# Patient Record
Sex: Male | Born: 1955 | Race: White | Hispanic: No | Marital: Married | Smoking: Former smoker
Health system: Southern US, Community
[De-identification: ages and names within clinical notes are randomized; demographics above are authoritative.]

## PROBLEM LIST (undated history)

## (undated) DIAGNOSIS — R112 Nausea with vomiting, unspecified: Secondary | ICD-10-CM

## (undated) DIAGNOSIS — G4733 Obstructive sleep apnea (adult) (pediatric): Secondary | ICD-10-CM

## (undated) DIAGNOSIS — K269 Duodenal ulcer, unspecified as acute or chronic, without hemorrhage or perforation: Secondary | ICD-10-CM

## (undated) DIAGNOSIS — Z9889 Other specified postprocedural states: Secondary | ICD-10-CM

## (undated) DIAGNOSIS — G2581 Restless legs syndrome: Secondary | ICD-10-CM

## (undated) DIAGNOSIS — H269 Unspecified cataract: Secondary | ICD-10-CM

## (undated) DIAGNOSIS — Z9989 Dependence on other enabling machines and devices: Secondary | ICD-10-CM

## (undated) DIAGNOSIS — E7800 Pure hypercholesterolemia, unspecified: Secondary | ICD-10-CM

## (undated) DIAGNOSIS — T884XXA Failed or difficult intubation, initial encounter: Secondary | ICD-10-CM

## (undated) DIAGNOSIS — I10 Essential (primary) hypertension: Secondary | ICD-10-CM

## (undated) HISTORY — PX: OTHER SURGICAL HISTORY: SHX169

## (undated) HISTORY — PX: ESOPHAGOGASTRODUODENOSCOPY: SHX1529

## (undated) HISTORY — PX: COLON SURGERY: SHX602

## (undated) HISTORY — PX: HAND SURGERY: SHX662

## (undated) HISTORY — PX: COLONOSCOPY: SHX174

---
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
## 1993-02-03 HISTORY — PX: FRACTURE SURGERY: SHX138

## 1996-02-04 HISTORY — PX: NASAL SEPTUM SURGERY: SHX37

## 1998-02-03 HISTORY — PX: REFRACTIVE SURGERY: SHX103

## 1999-02-04 HISTORY — PX: HERNIA REPAIR: SHX51

## 2002-02-03 HISTORY — PX: ROTATOR CUFF REPAIR: SHX139

## 2002-06-10 ENCOUNTER — Ambulatory Visit (HOSPITAL_BASED_OUTPATIENT_CLINIC_OR_DEPARTMENT_OTHER): Admission: RE | Admit: 2002-06-10 | Discharge: 2002-06-10 | Payer: Self-pay | Admitting: Orthopedic Surgery

## 2006-02-03 HISTORY — PX: COLECTOMY: SHX59

## 2006-02-03 HISTORY — PX: APPENDECTOMY: SHX54

## 2006-02-14 ENCOUNTER — Inpatient Hospital Stay (HOSPITAL_COMMUNITY): Admission: EM | Admit: 2006-02-14 | Discharge: 2006-02-21 | Payer: Self-pay | Admitting: Emergency Medicine

## 2006-03-30 ENCOUNTER — Encounter: Admission: RE | Admit: 2006-03-30 | Discharge: 2006-03-30 | Payer: Self-pay | Admitting: Surgery

## 2010-11-25 ENCOUNTER — Ambulatory Visit: Payer: PRIVATE HEALTH INSURANCE | Attending: Family Medicine

## 2010-11-25 DIAGNOSIS — IMO0001 Reserved for inherently not codable concepts without codable children: Secondary | ICD-10-CM | POA: Insufficient documentation

## 2010-11-25 DIAGNOSIS — M545 Low back pain, unspecified: Secondary | ICD-10-CM | POA: Insufficient documentation

## 2010-11-25 DIAGNOSIS — M546 Pain in thoracic spine: Secondary | ICD-10-CM | POA: Insufficient documentation

## 2010-11-25 DIAGNOSIS — R5381 Other malaise: Secondary | ICD-10-CM | POA: Insufficient documentation

## 2010-11-29 ENCOUNTER — Ambulatory Visit: Payer: PRIVATE HEALTH INSURANCE | Admitting: Physical Therapy

## 2010-12-03 ENCOUNTER — Ambulatory Visit: Payer: PRIVATE HEALTH INSURANCE | Admitting: Physical Therapy

## 2010-12-06 ENCOUNTER — Ambulatory Visit: Payer: PRIVATE HEALTH INSURANCE | Attending: Family Medicine | Admitting: Physical Therapy

## 2010-12-06 DIAGNOSIS — M545 Low back pain, unspecified: Secondary | ICD-10-CM | POA: Insufficient documentation

## 2010-12-06 DIAGNOSIS — M546 Pain in thoracic spine: Secondary | ICD-10-CM | POA: Insufficient documentation

## 2010-12-06 DIAGNOSIS — IMO0001 Reserved for inherently not codable concepts without codable children: Secondary | ICD-10-CM | POA: Insufficient documentation

## 2010-12-06 DIAGNOSIS — R5381 Other malaise: Secondary | ICD-10-CM | POA: Insufficient documentation

## 2010-12-13 ENCOUNTER — Ambulatory Visit: Payer: PRIVATE HEALTH INSURANCE | Admitting: Physical Therapy

## 2010-12-20 ENCOUNTER — Ambulatory Visit: Payer: PRIVATE HEALTH INSURANCE

## 2012-03-16 ENCOUNTER — Encounter (HOSPITAL_BASED_OUTPATIENT_CLINIC_OR_DEPARTMENT_OTHER): Payer: 59

## 2012-03-23 ENCOUNTER — Ambulatory Visit (HOSPITAL_BASED_OUTPATIENT_CLINIC_OR_DEPARTMENT_OTHER): Payer: 59 | Attending: Family Medicine | Admitting: Radiology

## 2012-03-23 VITALS — Ht 75.0 in | Wt 270.0 lb

## 2012-03-23 DIAGNOSIS — G4733 Obstructive sleep apnea (adult) (pediatric): Secondary | ICD-10-CM | POA: Insufficient documentation

## 2012-03-23 DIAGNOSIS — Z9989 Dependence on other enabling machines and devices: Secondary | ICD-10-CM

## 2012-03-27 DIAGNOSIS — G4733 Obstructive sleep apnea (adult) (pediatric): Secondary | ICD-10-CM

## 2012-03-27 NOTE — Procedures (Signed)
NAMEPHILIPPE, Ian Velasquez                 ACCOUNT NO.:  [id number]  MEDICAL RECORD NO.:  [id number]          PATIENT TYPE:  OUT  LOCATION:  SLEEP CENTER                 FACILITY:  [facility]  PHYSICIAN:  Hayzel Ruberg D. Maple Hudson, MD, FCCP, FACPDATE OF BIRTH:  Jun 10[birth year] 1955  DATE OF STUDY:  03/23/2012                           NOCTURNAL POLYSOMNOGRAM  REFERRING PHYSICIAN:  Dibas Koirala, MD  INDICATION FOR STUDY:  Insomnia with sleep apnea.  EPWORTH SLEEPINESS SCORE:  13/24.  BMI 33.7, weight 270 pounds, height 75 inches, neck 18 inches.  MEDICATIONS:  Home medications are charted and reviewed.  SLEEP ARCHITECTURE:  Split study protocol.  During the diagnostic phase, total sleep time 155 minutes with sleep efficiency 82.2%.  Stage I was 7.4%, stage II 74.8%, stage III absent, REM 17.7% of total sleep time. Sleep latency 24.5 minutes, REM latency 85.5 minutes, awake after sleep onset 7 minutes.  Arousal index of 17.  BEDTIME MEDICATION:  Simvastatin, clonazepam, meloxicam.  RESPIRATORY DATA:  Split study protocol.  Apnea-hypopnea index (AHI) 19 per hour.  A total of 49 events was scored, all as hypopneas seen in all sleep positions, especially while supine.  REM AHI 69.8 per hour.  CPAP was then titrated to 15 CWP, AHI 0 per hour.  He wore a large ResMed Mirage Quattro full-face mask with heated humidifier.  OXYGEN DATA:  Moderately loud snoring before CPAP with oxygen desaturation to a nadir of 81% on room air.  With CPAP titration, snoring was prevented and mean oxygen saturation held 94.8% on room air.  CARDIAC DATA:  Sinus rhythm with occasional PVC.  MOVEMENT-PARASOMNIA:  No significant movement disturbance.  No bathroom trips.  IMPRESSIONS-RECOMMENDATIONS: 1. Moderate obstructive sleep apnea/hypopnea syndrome, AHI 19 per hour     with events in all sleep positions, especially while supine.     Moderately loud snoring with oxygen desaturation to a nadir of 81%     and mean oxygen saturation  through the study of 94% on room air. 2. Successful CPAP titration to 15 CWP, AHI 0 per hour.  He wore a     large ResMed Mirage Quattro full-face     mask with heated humidifier and EPR of 2.  Snoring was prevented     and mean oxygen saturation held 94.8% on room air with CPAP.     Orvin Netter D. Maple Hudson, MD, [employer] [facility], FACP Diplomate, American Board of Sleep Medicine    CDY/MEDQ  D:  03/27/2012 15:49:00  T:  03/27/2012 20:49:17  Job:  [id number]

## 2012-11-22 ENCOUNTER — Encounter (INDEPENDENT_AMBULATORY_CARE_PROVIDER_SITE_OTHER): Payer: Commercial Managed Care - PPO | Admitting: Ophthalmology

## 2012-11-22 DIAGNOSIS — H4310 Vitreous hemorrhage, unspecified eye: Secondary | ICD-10-CM

## 2012-11-22 DIAGNOSIS — H2510 Age-related nuclear cataract, unspecified eye: Secondary | ICD-10-CM

## 2012-11-22 DIAGNOSIS — H35039 Hypertensive retinopathy, unspecified eye: Secondary | ICD-10-CM

## 2012-11-22 DIAGNOSIS — H43819 Vitreous degeneration, unspecified eye: Secondary | ICD-10-CM

## 2012-11-22 DIAGNOSIS — I10 Essential (primary) hypertension: Secondary | ICD-10-CM

## 2012-11-23 ENCOUNTER — Ambulatory Visit
Admission: RE | Admit: 2012-11-23 | Discharge: 2012-11-23 | Disposition: A | Discharge status: Home / Self Care | Payer: 59 | Source: Ambulatory Visit | Attending: Family Medicine | Admitting: Family Medicine

## 2012-11-23 ENCOUNTER — Other Ambulatory Visit: Payer: Self-pay | Admitting: Family Medicine

## 2012-11-23 DIAGNOSIS — Z01818 Encounter for other preprocedural examination: Secondary | ICD-10-CM

## 2012-11-23 NOTE — H&P (Signed)
Ian Velasquez is an 57 y.o. male.   Chief Complaint: Sudden loss of vision right eye HPI: Lifted an 80 pound item, saw strings and lost vision right eye  No past medical history on file.  No past surgical history on file.  No family history on file. Social History:  has no tobacco, alcohol, and drug history on file.  Allergies: Allergies not on file  No prescriptions prior to admission    Review of systems otherwise negative  There were no vitals taken for this visit.  Physical exam: Mental status: oriented x3. Eyes: See eye exam associated with this date of surgery in media tab.  Scanned in by scanning center Ears, Nose, Throat: within normal limits Neck: Within Normal limits General: within normal limits Chest: Within normal limits Breast: deferred Heart: Within normal limits Abdomen: Within normal limits GU: deferred Extremities: within normal limits Skin: within normal limits  Assessment/Plan Vitreous hemorrhage right eye Plan: To [facility] for Pars plana vitrectomy, laser, gas injection right eye.  Sherrie George 11/23/2012, 7:28 AM

## 2012-11-26 ENCOUNTER — Encounter (HOSPITAL_COMMUNITY): Payer: Self-pay | Admitting: Pharmacy Technician

## 2012-11-29 ENCOUNTER — Other Ambulatory Visit (HOSPITAL_COMMUNITY): Payer: Self-pay | Admitting: *Deleted

## 2012-11-29 ENCOUNTER — Encounter (HOSPITAL_COMMUNITY): Payer: Self-pay | Admitting: *Deleted

## 2012-11-29 ENCOUNTER — Encounter (INDEPENDENT_AMBULATORY_CARE_PROVIDER_SITE_OTHER): Payer: Commercial Managed Care - PPO | Admitting: Ophthalmology

## 2012-11-29 DIAGNOSIS — H43819 Vitreous degeneration, unspecified eye: Secondary | ICD-10-CM

## 2012-11-29 DIAGNOSIS — H4310 Vitreous hemorrhage, unspecified eye: Secondary | ICD-10-CM

## 2012-11-29 DIAGNOSIS — H2510 Age-related nuclear cataract, unspecified eye: Secondary | ICD-10-CM

## 2012-11-29 DIAGNOSIS — H35039 Hypertensive retinopathy, unspecified eye: Secondary | ICD-10-CM

## 2012-11-29 DIAGNOSIS — I10 Essential (primary) hypertension: Secondary | ICD-10-CM

## 2012-11-29 MED ORDER — CYCLOPENTOLATE HCL 1 % OP SOLN: 1.0000 [drp] | OPHTHALMIC | Status: DC | PRN

## 2012-11-29 MED ORDER — PHENYLEPHRINE HCL 2.5 % OP SOLN: 1.0000 [drp] | OPHTHALMIC | Status: DC | PRN

## 2012-11-29 MED ORDER — TROPICAMIDE 1 % OP SOLN: 1.0000 [drp] | OPHTHALMIC | Status: DC | PRN

## 2012-11-29 MED ORDER — DEXTROSE 5 % IV SOLN
3.0000 g | INTRAVENOUS | Status: AC
  Administered 2012-11-30: 3 g via INTRAVENOUS
  Filled 2012-11-29: qty 3000

## 2012-11-29 MED ORDER — GATIFLOXACIN 0.5 % OP SOLN: 1.0000 [drp] | OPHTHALMIC | Status: DC | PRN

## 2012-11-30 ENCOUNTER — Observation Stay (HOSPITAL_COMMUNITY)
Admission: RE | Admit: 2012-11-30 | Discharge: 2012-12-01 | Disposition: A | Discharge status: Home / Self Care | Payer: 59 | Source: Ambulatory Visit | Attending: Ophthalmology | Admitting: Ophthalmology

## 2012-11-30 ENCOUNTER — Encounter (HOSPITAL_COMMUNITY)
Admission: RE | Disposition: A | Discharge status: Home / Self Care | Payer: Self-pay | Source: Ambulatory Visit | Attending: Ophthalmology

## 2012-11-30 ENCOUNTER — Encounter (HOSPITAL_COMMUNITY): Payer: 59 | Admitting: Certified Registered Nurse Anesthetist

## 2012-11-30 ENCOUNTER — Encounter (HOSPITAL_COMMUNITY): Payer: Self-pay | Admitting: *Deleted

## 2012-11-30 ENCOUNTER — Ambulatory Visit (HOSPITAL_COMMUNITY): Payer: 59 | Admitting: Certified Registered Nurse Anesthetist

## 2012-11-30 DIAGNOSIS — H33301 Unspecified retinal break, right eye: Secondary | ICD-10-CM | POA: Diagnosis present

## 2012-11-30 DIAGNOSIS — H33009 Unspecified retinal detachment with retinal break, unspecified eye: Principal | ICD-10-CM | POA: Insufficient documentation

## 2012-11-30 DIAGNOSIS — H4311 Vitreous hemorrhage, right eye: Secondary | ICD-10-CM

## 2012-11-30 DIAGNOSIS — I10 Essential (primary) hypertension: Secondary | ICD-10-CM | POA: Insufficient documentation

## 2012-11-30 DIAGNOSIS — H4310 Vitreous hemorrhage, unspecified eye: Secondary | ICD-10-CM

## 2012-11-30 DIAGNOSIS — G4730 Sleep apnea, unspecified: Secondary | ICD-10-CM | POA: Insufficient documentation

## 2012-11-30 HISTORY — DX: Pure hypercholesterolemia, unspecified: E78.00

## 2012-11-30 HISTORY — DX: Duodenal ulcer, unspecified as acute or chronic, without hemorrhage or perforation: K26.9

## 2012-11-30 HISTORY — DX: Essential (primary) hypertension: I10

## 2012-11-30 HISTORY — PX: PARS PLANA VITRECTOMY: SHX2166

## 2012-11-30 HISTORY — DX: Dependence on other enabling machines and devices: Z99.89

## 2012-11-30 HISTORY — PX: GAS/FLUID EXCHANGE: SHX5334

## 2012-11-30 HISTORY — DX: Failed or difficult intubation, initial encounter: T88.4XXA

## 2012-11-30 HISTORY — PX: PHOTOCOAGULATION WITH LASER: SHX6027

## 2012-11-30 HISTORY — DX: Obstructive sleep apnea (adult) (pediatric): G47.33

## 2012-11-30 LAB — BASIC METABOLIC PANEL
BUN: 18 mg/dL (ref 6–23)
CO2: 28 mEq/L (ref 19–32)
Chloride: 102 mEq/L (ref 96–112)
Creatinine, Ser: 0.93 mg/dL (ref 0.50–1.35)
Glucose, Bld: 113 mg/dL — ABNORMAL HIGH (ref 70–99)
Potassium: 4.3 mEq/L (ref 3.5–5.1)

## 2012-11-30 LAB — CBC
HCT: 42.5 % (ref 39.0–52.0)
Hemoglobin: 14.9 g/dL (ref 13.0–17.0)
MCH: 29.4 pg (ref 26.0–34.0)
MCHC: 35.1 g/dL (ref 30.0–36.0)
MCV: 83.8 fL (ref 78.0–100.0)

## 2012-11-30 SURGERY — PARS PLANA VITRECTOMY WITH 25 GAUGE
Anesthesia: General | Site: Eye | Laterality: Right | Wound class: Clean

## 2012-11-30 MED ORDER — ACETAZOLAMIDE SODIUM 500 MG IJ SOLR
500.0000 mg | Freq: Once | INTRAMUSCULAR | Status: AC
  Administered 2012-12-01: 500 mg via INTRAVENOUS
  Filled 2012-11-30: qty 500

## 2012-11-30 MED ORDER — ONDANSETRON HCL 4 MG/2ML IJ SOLN
INTRAMUSCULAR | Status: DC | PRN
  Administered 2012-11-30: 4 mg via INTRAVENOUS

## 2012-11-30 MED ORDER — EPINEPHRINE HCL 1 MG/ML IJ SOLN
INTRAOCULAR | Status: DC | PRN
  Administered 2012-11-30: 14:00:00

## 2012-11-30 MED ORDER — GATIFLOXACIN 0.5 % OP SOLN
1.0000 [drp] | OPHTHALMIC | Status: AC | PRN
  Administered 2012-11-30 (×3): 1 [drp] via OPHTHALMIC
  Filled 2012-11-30: qty 2.5

## 2012-11-30 MED ORDER — POLYMYXIN B SULFATE 500000 UNITS IJ SOLR
INTRAMUSCULAR | Status: AC
  Filled 2012-11-30: qty 1

## 2012-11-30 MED ORDER — ACETAZOLAMIDE SODIUM 500 MG IJ SOLR
INTRAMUSCULAR | Status: AC
  Filled 2012-11-30: qty 500

## 2012-11-30 MED ORDER — ACETAMINOPHEN 325 MG PO TABS
325.0000 mg | ORAL_TABLET | ORAL | Status: DC | PRN
  Administered 2012-11-30: 650 mg via ORAL
  Filled 2012-11-30: qty 2

## 2012-11-30 MED ORDER — HYDROMORPHONE HCL PF 1 MG/ML IJ SOLN
0.2500 mg | INTRAMUSCULAR | Status: DC | PRN
  Administered 2012-11-30: 0.5 mg via INTRAVENOUS

## 2012-11-30 MED ORDER — BACITRACIN-POLYMYXIN B 500-10000 UNIT/GM OP OINT
TOPICAL_OINTMENT | OPHTHALMIC | Status: DC | PRN
  Administered 2012-11-30: 1 via OPHTHALMIC

## 2012-11-30 MED ORDER — HYDROMORPHONE HCL PF 1 MG/ML IJ SOLN
INTRAMUSCULAR | Status: AC
  Filled 2012-11-30: qty 1

## 2012-11-30 MED ORDER — GENTAMICIN SULFATE 40 MG/ML IJ SOLN
INTRAMUSCULAR | Status: AC
  Filled 2012-11-30: qty 2

## 2012-11-30 MED ORDER — PREDNISOLONE ACETATE 1 % OP SUSP
1.0000 [drp] | Freq: Four times a day (QID) | OPHTHALMIC | Status: DC
  Filled 2012-11-30: qty 1

## 2012-11-30 MED ORDER — FENTANYL CITRATE 0.05 MG/ML IJ SOLN: 25.0000 ug | INTRAMUSCULAR | Status: DC | PRN

## 2012-11-30 MED ORDER — EPINEPHRINE HCL 1 MG/ML IJ SOLN
INTRAMUSCULAR | Status: AC
  Filled 2012-11-30: qty 1

## 2012-11-30 MED ORDER — BACITRACIN-POLYMYXIN B 500-10000 UNIT/GM OP OINT
1.0000 "application " | TOPICAL_OINTMENT | Freq: Four times a day (QID) | OPHTHALMIC | Status: DC
  Filled 2012-11-30: qty 3.5

## 2012-11-30 MED ORDER — DEXAMETHASONE SODIUM PHOSPHATE 10 MG/ML IJ SOLN
INTRAMUSCULAR | Status: DC | PRN
  Administered 2012-11-30: 10 mg

## 2012-11-30 MED ORDER — HYPROMELLOSE (GONIOSCOPIC) 2.5 % OP SOLN
OPHTHALMIC | Status: AC
  Filled 2012-11-30: qty 15

## 2012-11-30 MED ORDER — SODIUM CHLORIDE 0.9 % IV SOLN
INTRAVENOUS | Status: DC
  Administered 2012-11-30 (×2): via INTRAVENOUS

## 2012-11-30 MED ORDER — NEOSTIGMINE METHYLSULFATE 1 MG/ML IJ SOLN
INTRAMUSCULAR | Status: DC | PRN
  Administered 2012-11-30: 5 mg via INTRAVENOUS

## 2012-11-30 MED ORDER — TRIAMCINOLONE ACETONIDE 40 MG/ML IJ SUSP
INTRAMUSCULAR | Status: AC
  Filled 2012-11-30: qty 5

## 2012-11-30 MED ORDER — MIDAZOLAM HCL 5 MG/5ML IJ SOLN
INTRAMUSCULAR | Status: DC | PRN
  Administered 2012-11-30: 2 mg via INTRAVENOUS

## 2012-11-30 MED ORDER — SODIUM CHLORIDE 0.9 % IJ SOLN
INTRAMUSCULAR | Status: DC | PRN
  Administered 2012-11-30: 14:00:00

## 2012-11-30 MED ORDER — CYCLOPENTOLATE HCL 1 % OP SOLN
1.0000 [drp] | OPHTHALMIC | Status: AC | PRN
  Administered 2012-11-30 (×3): 1 [drp] via OPHTHALMIC
  Filled 2012-11-30: qty 2

## 2012-11-30 MED ORDER — TETRACAINE HCL 0.5 % OP SOLN
2.0000 [drp] | Freq: Once | OPHTHALMIC | Status: DC
  Filled 2012-11-30: qty 2

## 2012-11-30 MED ORDER — SODIUM CHLORIDE 0.9 % IJ SOLN
INTRAMUSCULAR | Status: AC
  Filled 2012-11-30: qty 10

## 2012-11-30 MED ORDER — BRIMONIDINE TARTRATE 0.2 % OP SOLN
1.0000 [drp] | Freq: Two times a day (BID) | OPHTHALMIC | Status: DC
  Filled 2012-11-30: qty 5

## 2012-11-30 MED ORDER — DOCUSATE SODIUM 100 MG PO CAPS
100.0000 mg | ORAL_CAPSULE | Freq: Two times a day (BID) | ORAL | Status: DC
  Administered 2012-11-30: 100 mg via ORAL
  Filled 2012-11-30: qty 1

## 2012-11-30 MED ORDER — GLYCOPYRROLATE 0.2 MG/ML IJ SOLN
INTRAMUSCULAR | Status: DC | PRN
  Administered 2012-11-30: .8 mg via INTRAVENOUS

## 2012-11-30 MED ORDER — BSS IO SOLN
INTRAOCULAR | Status: AC
  Filled 2012-11-30: qty 15

## 2012-11-30 MED ORDER — MINERAL OIL LIGHT 100 % EX OIL
TOPICAL_OIL | CUTANEOUS | Status: AC
  Filled 2012-11-30: qty 25

## 2012-11-30 MED ORDER — SODIUM CHLORIDE 0.45 % IV SOLN
INTRAVENOUS | Status: DC
  Administered 2012-11-30: 20 mL/h via INTRAVENOUS

## 2012-11-30 MED ORDER — HYALURONIDASE HUMAN 150 UNIT/ML IJ SOLN
INTRAMUSCULAR | Status: AC
Start: 2012-11-30 — End: 2012-11-30
  Filled 2012-11-30: qty 1

## 2012-11-30 MED ORDER — PROPOFOL 10 MG/ML IV BOLUS
INTRAVENOUS | Status: DC | PRN
  Administered 2012-11-30: 200 mg via INTRAVENOUS

## 2012-11-30 MED ORDER — TROPICAMIDE 1 % OP SOLN
1.0000 [drp] | OPHTHALMIC | Status: AC | PRN
  Administered 2012-11-30 (×3): 1 [drp] via OPHTHALMIC
  Filled 2012-11-30: qty 3

## 2012-11-30 MED ORDER — HYDROCHLOROTHIAZIDE 25 MG PO TABS
25.0000 mg | ORAL_TABLET | Freq: Every morning | ORAL | Status: DC
  Filled 2012-11-30: qty 1

## 2012-11-30 MED ORDER — HYDROCODONE-ACETAMINOPHEN 5-325 MG PO TABS: 1.0000 | ORAL_TABLET | ORAL | Status: DC | PRN

## 2012-11-30 MED ORDER — ROCURONIUM BROMIDE 100 MG/10ML IV SOLN
INTRAVENOUS | Status: DC | PRN
  Administered 2012-11-30: 40 mg via INTRAVENOUS

## 2012-11-30 MED ORDER — BACITRACIN-POLYMYXIN B 500-10000 UNIT/GM OP OINT
TOPICAL_OINTMENT | OPHTHALMIC | Status: AC
  Filled 2012-11-30: qty 3.5

## 2012-11-30 MED ORDER — LACTATED RINGERS IV SOLN: INTRAVENOUS | Status: DC

## 2012-11-30 MED ORDER — PHENYLEPHRINE HCL 2.5 % OP SOLN
1.0000 [drp] | OPHTHALMIC | Status: AC | PRN
  Administered 2012-11-30 (×3): 1 [drp] via OPHTHALMIC
  Filled 2012-11-30: qty 2

## 2012-11-30 MED ORDER — BUPIVACAINE-EPINEPHRINE PF 0.25-1:200000 % IJ SOLN
INTRAMUSCULAR | Status: AC
  Filled 2012-11-30: qty 30

## 2012-11-30 MED ORDER — LIDOCAINE HCL (CARDIAC) 20 MG/ML IV SOLN
INTRAVENOUS | Status: DC | PRN
  Administered 2012-11-30: 40 mg via INTRAVENOUS

## 2012-11-30 MED ORDER — MAGNESIUM HYDROXIDE 400 MG/5ML PO SUSP: 15.0000 mL | Freq: Four times a day (QID) | ORAL | Status: DC | PRN

## 2012-11-30 MED ORDER — SODIUM HYALURONATE 10 MG/ML IO SOLN
INTRAOCULAR | Status: DC | PRN
  Administered 2012-11-30: 0.85 mL via INTRAOCULAR

## 2012-11-30 MED ORDER — ONDANSETRON HCL 4 MG/2ML IJ SOLN: 4.0000 mg | Freq: Four times a day (QID) | INTRAMUSCULAR | Status: DC | PRN

## 2012-11-30 MED ORDER — BSS PLUS IO SOLN
INTRAOCULAR | Status: AC
  Filled 2012-11-30: qty 500

## 2012-11-30 MED ORDER — WHITE PETROLATUM GEL
Status: AC
  Administered 2012-11-30: 0.2
  Filled 2012-11-30: qty 5

## 2012-11-30 MED ORDER — SODIUM HYALURONATE 10 MG/ML IO SOLN
INTRAOCULAR | Status: AC
  Filled 2012-11-30: qty 0.85

## 2012-11-30 MED ORDER — BUPIVACAINE HCL (PF) 0.75 % IJ SOLN
INTRAMUSCULAR | Status: AC
  Filled 2012-11-30: qty 10

## 2012-11-30 MED ORDER — LIDOCAINE HCL 2 % IJ SOLN
INTRAMUSCULAR | Status: AC
  Filled 2012-11-30: qty 20

## 2012-11-30 MED ORDER — HEMOSTATIC AGENTS (NO CHARGE) OPTIME
TOPICAL | Status: DC | PRN
  Administered 2012-11-30: 1 via TOPICAL

## 2012-11-30 MED ORDER — ATROPINE SULFATE 1 % OP SOLN
OPHTHALMIC | Status: AC
  Filled 2012-11-30: qty 2

## 2012-11-30 MED ORDER — FENTANYL CITRATE 0.05 MG/ML IJ SOLN
INTRAMUSCULAR | Status: DC | PRN
  Administered 2012-11-30: 150 ug via INTRAVENOUS

## 2012-11-30 MED ORDER — BUPIVACAINE HCL (PF) 0.75 % IJ SOLN
INTRAMUSCULAR | Status: DC | PRN
  Administered 2012-11-30: 10 mL

## 2012-11-30 MED ORDER — SUCCINYLCHOLINE CHLORIDE 20 MG/ML IJ SOLN
INTRAMUSCULAR | Status: DC | PRN
  Administered 2012-11-30: 100 mg via INTRAVENOUS

## 2012-11-30 MED ORDER — CLONAZEPAM 0.5 MG PO TABS: 0.5000 mg | ORAL_TABLET | Freq: Every day | ORAL | Status: DC | PRN

## 2012-11-30 MED ORDER — DEXAMETHASONE SODIUM PHOSPHATE 10 MG/ML IJ SOLN
INTRAMUSCULAR | Status: AC
  Filled 2012-11-30: qty 1

## 2012-11-30 MED ORDER — SIMVASTATIN 20 MG PO TABS
20.0000 mg | ORAL_TABLET | Freq: Every evening | ORAL | Status: DC
  Administered 2012-11-30: 20 mg via ORAL
  Filled 2012-11-30 (×2): qty 1

## 2012-11-30 MED ORDER — LATANOPROST 0.005 % OP SOLN
1.0000 [drp] | Freq: Every day | OPHTHALMIC | Status: DC
  Filled 2012-11-30: qty 2.5

## 2012-11-30 MED ORDER — TEMAZEPAM 15 MG PO CAPS
15.0000 mg | ORAL_CAPSULE | Freq: Every evening | ORAL | Status: DC | PRN
  Administered 2012-11-30: 15 mg via ORAL
  Filled 2012-11-30: qty 1

## 2012-11-30 MED ORDER — GATIFLOXACIN 0.5 % OP SOLN
1.0000 [drp] | Freq: Four times a day (QID) | OPHTHALMIC | Status: DC
  Filled 2012-11-30 (×2): qty 2.5

## 2012-11-30 MED ORDER — MORPHINE SULFATE 2 MG/ML IJ SOLN: 1.0000 mg | INTRAMUSCULAR | Status: DC | PRN

## 2012-11-30 SURGICAL SUPPLY — 71 items
APL SRG 3 HI ABS STRL LF PLS (MISCELLANEOUS)
APPLICATOR DR MATTHEWS STRL (MISCELLANEOUS) IMPLANT
BALL CTTN LRG ABS STRL LF (GAUZE/BANDAGES/DRESSINGS) ×3
BLADE EYE CATARACT 19 1.4 BEAV (BLADE) IMPLANT
BLADE MVR KNIFE 19G (BLADE) IMPLANT
BLADE MVR KNIFE 20G (BLADE) IMPLANT
CANNULA DUAL BORE 23G (CANNULA) IMPLANT
CANNULA VLV SOFT TIP 25G (OPHTHALMIC) ×1 IMPLANT
CANNULA VLV SOFT TIP 25GA (OPHTHALMIC) ×2 IMPLANT
CLOTH BEACON ORANGE TIMEOUT ST (SAFETY) ×2 IMPLANT
CORDS BIPOLAR (ELECTRODE) IMPLANT
COTTONBALL LRG STERILE PKG (GAUZE/BANDAGES/DRESSINGS) ×6 IMPLANT
COVER MAYO STAND STRL (DRAPES) ×2 IMPLANT
DRAPE INCISE 51X51 W/FILM STRL (DRAPES) ×2 IMPLANT
DRAPE OPHTHALMIC 77X100 STRL (CUSTOM PROCEDURE TRAY) ×2 IMPLANT
FILTER BLUE MILLIPORE (MISCELLANEOUS) ×1 IMPLANT
FILTER STRAW FLUID ASPIR (MISCELLANEOUS) IMPLANT
FORCEPS ECKARDT ILM 25G SERR (OPHTHALMIC RELATED) IMPLANT
GLOVE SS BIOGEL STRL SZ 6.5 (GLOVE) ×1 IMPLANT
GLOVE SS BIOGEL STRL SZ 7 (GLOVE) ×1 IMPLANT
GLOVE SUPERSENSE BIOGEL SZ 6.5 (GLOVE) ×1
GLOVE SUPERSENSE BIOGEL SZ 7 (GLOVE) ×1
GLOVE SURG 8.5 LATEX PF (GLOVE) ×2 IMPLANT
GOWN STRL NON-REIN LRG LVL3 (GOWN DISPOSABLE) ×6 IMPLANT
KIT BASIN OR (CUSTOM PROCEDURE TRAY) ×2 IMPLANT
KNIFE CRESCENT 2.5 55 ANG (BLADE) IMPLANT
MASK EYE SHIELD (GAUZE/BANDAGES/DRESSINGS) ×1 IMPLANT
MICROPICK 25G (MISCELLANEOUS)
NDL 18GX1X1/2 (RX/OR ONLY) (NEEDLE) ×1 IMPLANT
NDL 25GX 5/8IN NON SAFETY (NEEDLE) ×1 IMPLANT
NDL FILTER BLUNT 18X1 1/2 (NEEDLE) ×1 IMPLANT
NDL HYPO 30X.5 LL (NEEDLE) ×1 IMPLANT
NEEDLE 18GX1X1/2 (RX/OR ONLY) (NEEDLE) ×2 IMPLANT
NEEDLE 25GX 5/8IN NON SAFETY (NEEDLE) ×2 IMPLANT
NEEDLE 27GAX1X1/2 (NEEDLE) IMPLANT
NEEDLE FILTER BLUNT 18X 1/2SAF (NEEDLE) ×1
NEEDLE FILTER BLUNT 18X1 1/2 (NEEDLE) ×1 IMPLANT
NEEDLE HYPO 30X.5 LL (NEEDLE) ×2 IMPLANT
NS IRRIG 1000ML POUR BTL (IV SOLUTION) ×2 IMPLANT
PACK VITRECTOMY CUSTOM (CUSTOM PROCEDURE TRAY) ×2 IMPLANT
PAD ARMBOARD 7.5X6 YLW CONV (MISCELLANEOUS) ×4 IMPLANT
PAD EYE OVAL STERILE LF (GAUZE/BANDAGES/DRESSINGS) ×1 IMPLANT
PAK PIK VITRECTOMY CVS 25GA (OPHTHALMIC) ×2 IMPLANT
PENCIL BIPOLAR 25GA STR DISP (OPHTHALMIC RELATED) IMPLANT
PIC ILLUMINATED 25G (OPHTHALMIC) ×2
PICK MICROPICK 25G (MISCELLANEOUS) IMPLANT
PIK ILLUMINATED 25G (OPHTHALMIC) ×1 IMPLANT
PROBE LASER ILLUM FLEX CVD 25G (OPHTHALMIC) IMPLANT
REPL STRA BRUSH NDL (NEEDLE) IMPLANT
REPL STRA BRUSH NEEDLE (NEEDLE) IMPLANT
RESERVOIR BACK FLUSH (MISCELLANEOUS) IMPLANT
ROLLS DENTAL (MISCELLANEOUS) ×4 IMPLANT
SCRAPER DIAMOND DUST MEMBRANE (MISCELLANEOUS) IMPLANT
SPONGE SURGIFOAM ABS GEL 12-7 (HEMOSTASIS) ×2 IMPLANT
STOPCOCK 4 WAY LG BORE MALE ST (IV SETS) IMPLANT
SUT CHROMIC 7 0 TG140 8 (SUTURE) IMPLANT
SUT ETHILON 10 0 CS140 6 (SUTURE) IMPLANT
SUT ETHILON 9 0 TG140 8 (SUTURE) IMPLANT
SUT POLY NON ABSORB 10-0 8 STR (SUTURE) IMPLANT
SUT SILK 4 0 RB 1 (SUTURE) IMPLANT
SYR 20CC LL (SYRINGE) ×2 IMPLANT
SYR 5ML LL (SYRINGE) IMPLANT
SYR BULB 3OZ (MISCELLANEOUS) ×2 IMPLANT
SYR TB 1ML LUER SLIP (SYRINGE) ×2 IMPLANT
SYRINGE 10CC LL (SYRINGE) IMPLANT
TAPE SURG TRANSPORE 1 IN (GAUZE/BANDAGES/DRESSINGS) IMPLANT
TAPE SURGICAL TRANSPORE 1 IN (GAUZE/BANDAGES/DRESSINGS) ×1
TOWEL OR 17X24 6PK STRL BLUE (TOWEL DISPOSABLE) ×6 IMPLANT
TROCAR CANNULA 25GA (CANNULA) IMPLANT
WATER STERILE IRR 1000ML POUR (IV SOLUTION) ×2 IMPLANT
WIPE INSTRUMENT VISIWIPE 73X73 (MISCELLANEOUS) ×2 IMPLANT

## 2012-11-30 NOTE — Anesthesia Postprocedure Evaluation (Signed)
  Anesthesia Post-op Note  Patient: Ian Velasquez  Procedure(s) Performed: Procedure(s): PARS PLANA VITRECTOMY WITH 25 GAUGE (Right) HEADSCOPE AND ENDOLASER (Right) AIR/FLUID EXCHANGE (Right)  Patient Location: PACU  Anesthesia Type:General  Level of Consciousness: awake, alert  and oriented  Airway and Oxygen Therapy: Patient Spontanous Breathing  Post-op Pain: mild  Post-op Assessment: Post-op Vital signs reviewed, Patient's Cardiovascular Status Stable, Respiratory Function Stable, Patent Airway and Pain level controlled  Post-op Vital Signs: stable  Complications: No apparent anesthesia complications

## 2012-11-30 NOTE — Brief Op Note (Signed)
Brief Operative note   Preoperative diagnosis:  Pre-Op Diagnosis Codes:    * Vitreous hemorrhage, right [379.23] Postoperative diagnosis  Post-Op Diagnosis Codes:    * Vitreous hemorrhage, right [379.23] Retinal break right eye  Procedures: Pars plana vitrectomy, laser therapy, gas injection  Right eye  Surgeon:  Sherrie George, MD...  Assistant:  Rosalie Doctor SA    Anesthesia: General  Specimen: none  Estimated blood loss:  1cc  Complications: none  Patient sent to PACU in good condition  Composed by Sherrie George MD  Dictation number: [phone]

## 2012-11-30 NOTE — Anesthesia Preprocedure Evaluation (Addendum)
Anesthesia Evaluation  Patient identified by MRN, date of birth, ID band Patient awake    Reviewed: Allergy & Precautions, H&P , NPO status , Patient's Chart, lab work & pertinent test results  History of Anesthesia Complications (+) DIFFICULT AIRWAY  Airway Mallampati: III TM Distance: >3 FB Neck ROM: full    Dental no notable dental hx. (+) Teeth Intact and Dental Advisory Given   Pulmonary sleep apnea and Continuous Positive Airway Pressure Ventilation ,  breath sounds clear to auscultation  Pulmonary exam normal       Cardiovascular Exercise Tolerance: Good hypertension, Pt. on medications Rhythm:regular Rate:Normal     Neuro/Psych negative neurological ROS  negative psych ROS   GI/Hepatic negative GI ROS, Neg liver ROS,   Endo/Other  negative endocrine ROS  Renal/GU negative Renal ROS  negative genitourinary   Musculoskeletal   Abdominal   Peds  Hematology negative hematology ROS (+)   Anesthesia Other Findings   Reproductive/Obstetrics negative OB ROS                          Anesthesia Physical Anesthesia Plan  ASA: III  Anesthesia Plan: General   Post-op Pain Management:    Induction: Intravenous  Airway Management Planned: Oral ETT and Video Laryngoscope Planned  Additional Equipment:   Intra-op Plan:   Post-operative Plan: Extubation in OR  Informed Consent: I have reviewed the patients History and Physical, chart, labs and discussed the procedure including the risks, benefits and alternatives for the proposed anesthesia with the patient or authorized representative who has indicated his/her understanding and acceptance.   Dental Advisory Given  Plan Discussed with: CRNA and Surgeon  Anesthesia Plan Comments:        Anesthesia Quick Evaluation

## 2012-11-30 NOTE — Progress Notes (Signed)
Pt will place on mask when ready for bed. No distress noted. Family at bedside. Pt encouraged to call RT if needed.

## 2012-11-30 NOTE — Transfer of Care (Signed)
Immediate Anesthesia Transfer of Care Note  Patient: Ian Velasquez  Procedure(s) Performed: Procedure(s): PARS PLANA VITRECTOMY WITH 25 GAUGE (Right) HEADSCOPE AND ENDOLASER (Right) AIR/FLUID EXCHANGE (Right)  Patient Location: PACU  Anesthesia Type:General  Level of Consciousness: awake, alert  and oriented  Airway & Oxygen Therapy: Patient Spontanous Breathing  Post-op Assessment: Report given to PACU RN  Post vital signs: Reviewed and stable  Complications: No apparent anesthesia complications

## 2012-11-30 NOTE — H&P (Signed)
I examined the patient today and there is no change in the medical status 

## 2012-12-01 ENCOUNTER — Encounter (HOSPITAL_COMMUNITY): Payer: Self-pay | Admitting: Ophthalmology

## 2012-12-01 MED ORDER — BACITRACIN-POLYMYXIN B 500-10000 UNIT/GM OP OINT: 1.0000 "application " | TOPICAL_OINTMENT | Freq: Four times a day (QID) | OPHTHALMIC | Status: DC

## 2012-12-01 MED ORDER — PREDNISOLONE ACETATE 1 % OP SUSP: 1.0000 [drp] | Freq: Four times a day (QID) | OPHTHALMIC | Status: DC

## 2012-12-01 MED ORDER — GATIFLOXACIN 0.5 % OP SOLN: 1.0000 [drp] | Freq: Four times a day (QID) | OPHTHALMIC | Status: DC

## 2012-12-01 NOTE — Progress Notes (Signed)
0700 Patient discharge to home. Discharge instructions explain to patient. Patient verbalize understanding. Patient assisted to car via nurse tech.

## 2012-12-01 NOTE — Progress Notes (Signed)
12/01/2012, 6:42 AM  Mental Status:  Awake, Alert, Oriented  Anterior segment: Cornea  Clear    Anterior Chamber Clear    Lens:    Cataract  Intra Ocular Pressure 15 mmHg with Tonopen  Vitreous: Clear 30%gas bubble   Retina:  Attached Good laser reaction   Impression: Excellent result Retina attached   Final Diagnosis: Principal Problem:   Retinal break of right eye Active Problems:   Vitreous hemorrhage   Plan: start post operative eye drops.  Discharge to home.  Give post operative instructions  Sherrie George 12/01/2012, 6:42 AM

## 2012-12-01 NOTE — Discharge Summary (Signed)
Discharge summary not needed on [facility] patients per medical records. 

## 2012-12-01 NOTE — Op Note (Signed)
Ian Velasquez, Ian Velasquez NO.:  [id number]  MEDICAL RECORD NO.:  [id number]  LOCATION:  6N32C                        FACILITY:  [facility]  PHYSICIAN:  John D. Ashley Royalty, M.D. DATE OF BIRTH:  03/31/1955  DATE OF PROCEDURE:  11/30/2012 DATE OF DISCHARGE:                              OPERATIVE REPORT   ADMISSION DIAGNOSIS:  Vitreous hemorrhage, retinal breaks, right eye.  PROCEDURES:  Pars plana vitrectomy, laser photocoagulation for retinal breaks, right eye.  Gas-fluid exchange, right eye.  SURGEON:  Beulah Gandy. Ashley Royalty, M.D.  ASSISTANT:  Rosalie Doctor, SA.  ANESTHESIA:  General.  DETAILS:  The indirect ophthalmoscope laser was moved into place, 473 burns were placed around the retinal periphery at the superior retina. The power was 500 mW, 1000 microns each and 0.1 seconds each.  A retinal break at 12 o'clock was seen, a grey horseshoe tear within avulsed blood vessel.  Most of the treatment was based around this retinal break.  The attention was then carried to the pars plana area where the infusion port was placed at 8 o'clock and trocars at 8, 10 and 2 o'clock.  Provisc was placed on the corneal surface and the BIOM viewing system was moved into place.  Pars plana vitrectomy was begun just behind the crystalline lens.  The vitrectomy was carried posteriorly where additional blood and vitreous was removed in a core fashion.  The attention was carried to the midperiphery where additional blood and fluid was removed.  There was avulsed vessel at 4 o'clock near the equator, this was treated with endolaser.  Additional blood was removed from the far periphery.  Scleral depression was used to gain access to the vitreous base and the super wide BIOM viewing lens was used for this purpose as well.  Once all the blood was removed from the eye, the endolaser was positioned in the eye, 298 burns were placed around the periphery.  The power was 400 mW, 1000 microns each and 0.1  seconds each.  A 30% gas-fluid exchange was carried out at this point.  The instruments were removed from the eye.  The trocars were removed.  The wounds were held until they were secured.  Polymyxin and gentamicin were irrigated into tenon space.  Marcaine was injected around the globe for postop pain.  Decadron 10 mg was injected into the lower subconjunctival space.  Polysporin ophthalmic ointment, a patch and shield were placed with closing pressure was 10 with a Barraquer tonometer.  COMPLICATIONS:  None.  DURATION:  1 hour.  The patient was awakened and taken to recovery in satisfactory condition.     Beulah Gandy. Ashley Royalty, M.D.     JDM/MEDQ  D:  11/30/2012  T:  12/01/2012  Job:  [id number]

## 2012-12-07 ENCOUNTER — Inpatient Hospital Stay (INDEPENDENT_AMBULATORY_CARE_PROVIDER_SITE_OTHER): Payer: Self-pay | Admitting: Ophthalmology

## 2012-12-07 DIAGNOSIS — H33309 Unspecified retinal break, unspecified eye: Secondary | ICD-10-CM

## 2012-12-28 ENCOUNTER — Encounter (INDEPENDENT_AMBULATORY_CARE_PROVIDER_SITE_OTHER): Payer: Commercial Managed Care - PPO | Admitting: Ophthalmology

## 2012-12-28 DIAGNOSIS — I10 Essential (primary) hypertension: Secondary | ICD-10-CM

## 2012-12-28 DIAGNOSIS — H35039 Hypertensive retinopathy, unspecified eye: Secondary | ICD-10-CM

## 2012-12-28 DIAGNOSIS — H33309 Unspecified retinal break, unspecified eye: Secondary | ICD-10-CM

## 2013-01-04 ENCOUNTER — Other Ambulatory Visit: Payer: Self-pay | Admitting: Gastroenterology

## 2013-03-08 ENCOUNTER — Encounter (INDEPENDENT_AMBULATORY_CARE_PROVIDER_SITE_OTHER): Payer: Commercial Managed Care - PPO | Admitting: Ophthalmology

## 2013-03-08 DIAGNOSIS — H2510 Age-related nuclear cataract, unspecified eye: Secondary | ICD-10-CM

## 2013-03-08 DIAGNOSIS — H35039 Hypertensive retinopathy, unspecified eye: Secondary | ICD-10-CM

## 2013-03-08 DIAGNOSIS — H43819 Vitreous degeneration, unspecified eye: Secondary | ICD-10-CM

## 2013-03-08 DIAGNOSIS — I10 Essential (primary) hypertension: Secondary | ICD-10-CM

## 2014-03-08 ENCOUNTER — Ambulatory Visit (INDEPENDENT_AMBULATORY_CARE_PROVIDER_SITE_OTHER): Payer: Commercial Managed Care - PPO | Admitting: Ophthalmology

## 2014-03-08 DIAGNOSIS — I10 Essential (primary) hypertension: Secondary | ICD-10-CM

## 2014-03-08 DIAGNOSIS — H43812 Vitreous degeneration, left eye: Secondary | ICD-10-CM

## 2014-03-08 DIAGNOSIS — H35033 Hypertensive retinopathy, bilateral: Secondary | ICD-10-CM

## 2015-02-28 MED FILL — CLINDAMYCIN HCL 150 MG CAPS: 150 | 10 days supply | Qty: 40 | Fill #0

## 2015-03-05 DIAGNOSIS — E785 Hyperlipidemia, unspecified: Secondary | ICD-10-CM | POA: Diagnosis not present

## 2015-03-05 DIAGNOSIS — Z125 Encounter for screening for malignant neoplasm of prostate: Secondary | ICD-10-CM | POA: Diagnosis not present

## 2015-03-05 DIAGNOSIS — G2581 Restless legs syndrome: Secondary | ICD-10-CM | POA: Diagnosis not present

## 2015-03-05 DIAGNOSIS — R7301 Impaired fasting glucose: Secondary | ICD-10-CM | POA: Diagnosis not present

## 2015-03-05 DIAGNOSIS — Z0000 Encounter for general adult medical examination without abnormal findings: Secondary | ICD-10-CM | POA: Diagnosis not present

## 2015-03-05 DIAGNOSIS — I10 Essential (primary) hypertension: Secondary | ICD-10-CM | POA: Diagnosis not present

## 2015-03-09 ENCOUNTER — Ambulatory Visit (INDEPENDENT_AMBULATORY_CARE_PROVIDER_SITE_OTHER): Payer: Commercial Managed Care - PPO | Admitting: Ophthalmology

## 2015-03-26 ENCOUNTER — Ambulatory Visit (INDEPENDENT_AMBULATORY_CARE_PROVIDER_SITE_OTHER): Payer: 59 | Admitting: Ophthalmology

## 2015-03-26 DIAGNOSIS — H33301 Unspecified retinal break, right eye: Secondary | ICD-10-CM

## 2015-03-26 DIAGNOSIS — I10 Essential (primary) hypertension: Secondary | ICD-10-CM

## 2015-03-26 DIAGNOSIS — H2513 Age-related nuclear cataract, bilateral: Secondary | ICD-10-CM | POA: Diagnosis not present

## 2015-03-26 DIAGNOSIS — H35033 Hypertensive retinopathy, bilateral: Secondary | ICD-10-CM

## 2015-03-26 DIAGNOSIS — H43813 Vitreous degeneration, bilateral: Secondary | ICD-10-CM | POA: Diagnosis not present

## 2015-07-23 DIAGNOSIS — M549 Dorsalgia, unspecified: Secondary | ICD-10-CM | POA: Diagnosis not present

## 2015-07-23 DIAGNOSIS — K625 Hemorrhage of anus and rectum: Secondary | ICD-10-CM | POA: Diagnosis not present

## 2015-07-24 ENCOUNTER — Other Ambulatory Visit: Payer: Self-pay | Admitting: Family Medicine

## 2015-07-24 ENCOUNTER — Ambulatory Visit
Admission: RE | Admit: 2015-07-24 | Discharge: 2015-07-24 | Disposition: A | Discharge status: Home / Self Care | Payer: 59 | Source: Ambulatory Visit | Attending: Family Medicine | Admitting: Family Medicine

## 2015-07-24 DIAGNOSIS — M549 Dorsalgia, unspecified: Secondary | ICD-10-CM

## 2015-07-24 DIAGNOSIS — R222 Localized swelling, mass and lump, trunk: Secondary | ICD-10-CM | POA: Diagnosis not present

## 2015-08-14 DIAGNOSIS — R1310 Dysphagia, unspecified: Secondary | ICD-10-CM | POA: Diagnosis not present

## 2015-08-14 DIAGNOSIS — K625 Hemorrhage of anus and rectum: Secondary | ICD-10-CM | POA: Diagnosis not present

## 2015-09-04 DIAGNOSIS — R7301 Impaired fasting glucose: Secondary | ICD-10-CM | POA: Diagnosis not present

## 2015-09-04 DIAGNOSIS — G2581 Restless legs syndrome: Secondary | ICD-10-CM | POA: Diagnosis not present

## 2015-09-04 DIAGNOSIS — K432 Incisional hernia without obstruction or gangrene: Secondary | ICD-10-CM | POA: Diagnosis not present

## 2015-09-04 DIAGNOSIS — E7800 Pure hypercholesterolemia, unspecified: Secondary | ICD-10-CM | POA: Diagnosis not present

## 2015-09-04 DIAGNOSIS — I10 Essential (primary) hypertension: Secondary | ICD-10-CM | POA: Diagnosis not present

## 2015-09-07 MED FILL — GAVILYTE-N SOLUTION: 420 | 1 days supply | Qty: 4000 | Fill #0

## 2015-09-14 ENCOUNTER — Other Ambulatory Visit: Payer: Self-pay | Admitting: Gastroenterology

## 2015-09-14 DIAGNOSIS — K5730 Diverticulosis of large intestine without perforation or abscess without bleeding: Secondary | ICD-10-CM | POA: Diagnosis not present

## 2015-09-14 DIAGNOSIS — Z86010 Personal history of colonic polyps: Secondary | ICD-10-CM | POA: Diagnosis not present

## 2015-09-14 DIAGNOSIS — K3189 Other diseases of stomach and duodenum: Secondary | ICD-10-CM | POA: Diagnosis not present

## 2015-09-14 DIAGNOSIS — K640 First degree hemorrhoids: Secondary | ICD-10-CM | POA: Diagnosis not present

## 2015-09-14 DIAGNOSIS — Z980 Intestinal bypass and anastomosis status: Secondary | ICD-10-CM | POA: Diagnosis not present

## 2015-09-14 DIAGNOSIS — K2270 Barrett's esophagus without dysplasia: Secondary | ICD-10-CM | POA: Diagnosis not present

## 2015-09-14 DIAGNOSIS — K219 Gastro-esophageal reflux disease without esophagitis: Secondary | ICD-10-CM | POA: Diagnosis not present

## 2015-09-14 DIAGNOSIS — K208 Other esophagitis: Secondary | ICD-10-CM | POA: Diagnosis not present

## 2015-09-14 DIAGNOSIS — R1310 Dysphagia, unspecified: Secondary | ICD-10-CM | POA: Diagnosis not present

## 2015-09-19 ENCOUNTER — Other Ambulatory Visit: Payer: Self-pay | Admitting: Surgery

## 2015-09-19 DIAGNOSIS — K432 Incisional hernia without obstruction or gangrene: Secondary | ICD-10-CM | POA: Diagnosis not present

## 2015-11-29 MED FILL — clonazePAM 0.5 MG TABS: 0.5 | 30 days supply | Qty: 30 | Fill #0

## 2015-11-30 MED FILL — SIMVASTATIN 20 MG TABLET: 20 | 30 days supply | Qty: 30 | Fill #0

## 2015-12-10 ENCOUNTER — Encounter (HOSPITAL_COMMUNITY): Payer: Self-pay

## 2015-12-10 ENCOUNTER — Encounter (HOSPITAL_COMMUNITY)
Admission: RE | Admit: 2015-12-10 | Discharge: 2015-12-10 | Disposition: A | Discharge status: Home / Self Care | Payer: 59 | Source: Ambulatory Visit | Attending: Surgery | Admitting: Surgery

## 2015-12-10 DIAGNOSIS — Z01810 Encounter for preprocedural cardiovascular examination: Secondary | ICD-10-CM | POA: Diagnosis not present

## 2015-12-10 DIAGNOSIS — K432 Incisional hernia without obstruction or gangrene: Secondary | ICD-10-CM | POA: Diagnosis not present

## 2015-12-10 DIAGNOSIS — Z01812 Encounter for preprocedural laboratory examination: Secondary | ICD-10-CM | POA: Insufficient documentation

## 2015-12-10 HISTORY — DX: Other specified postprocedural states: R11.2

## 2015-12-10 HISTORY — DX: Restless legs syndrome: G25.81

## 2015-12-10 HISTORY — DX: Unspecified cataract: H26.9

## 2015-12-10 HISTORY — DX: Nausea with vomiting, unspecified: Z98.890

## 2015-12-10 LAB — BASIC METABOLIC PANEL
Anion gap: 10 (ref 5–15)
BUN: 19 mg/dL (ref 6–20)
CHLORIDE: 103 mmol/L (ref 101–111)
CO2: 27 mmol/L (ref 22–32)
CREATININE: 1.04 mg/dL (ref 0.61–1.24)
Calcium: 9.8 mg/dL (ref 8.9–10.3)
GFR calc Af Amer: >60 mL/min (ref >60)
GFR calc non Af Amer: >60 mL/min (ref >60)
Glucose, Bld: 111 mg/dL — ABNORMAL HIGH (ref 65–99)
Potassium: 4.3 mmol/L (ref 3.5–5.1)
SODIUM: 140 mmol/L (ref 135–145)

## 2015-12-10 LAB — CBC
HCT: 43.3 % (ref 39.0–52.0)
Hemoglobin: 14.4 g/dL (ref 13.0–17.0)
MCH: 26.3 pg (ref 26.0–34.0)
MCHC: 33.3 g/dL (ref 30.0–36.0)
MCV: 79 fL (ref 78.0–100.0)
PLATELETS: 209 10*3/uL (ref 150–400)
RBC: 5.48 MIL/uL (ref 4.22–5.81)
RDW: 13.4 % (ref 11.5–15.5)
WBC: 5.5 10*3/uL (ref 4.0–10.5)

## 2015-12-10 MED ORDER — CHLORHEXIDINE GLUCONATE CLOTH 2 % EX PADS: 6.0000 | MEDICATED_PAD | Freq: Once | CUTANEOUS | Status: DC

## 2015-12-10 NOTE — Pre-Procedure Instructions (Signed)
Ian Velasquez  12/10/2015      [facility] [place] [address] [place] [address] Phone: [phone] Fax: [phone]    Your procedure is scheduled on Mon, Nov 13 @ 7:30 AM  [facility] Admitting at 5:30 AM  Call this number if you have problems the morning of surgery:  [phone]   Remember:  Do not eat food or drink liquids after midnight.  Take these medicines the morning of surgery with A SIP OF WATER Omeprazole(Prilosec)              Stop taking your Aspirin and Mobic now. No Goody's,BC's,Aleve,Advil,Motrin,Ibuprofen,Fish Oil,or any Herbal Medications.    Do not wear jewelry.  Do not wear lotions, powders,colognes, or deoderant.             Men may shave face and neck.  Do not bring valuables to the hospital.  [facility] is not responsible for any belongings or valuables.  Contacts, dentures or bridgework may not be worn into surgery.  Leave your suitcase in the car.  After surgery it may be brought to your room.  For patients admitted to the hospital, discharge time will be determined by your treatment team.  Patients discharged the day of surgery will not be allowed to drive home.    [facility]  Before surgery, you can play an important role.  Because skin is not sterile, your skin needs to be as free of germs as possible.  You can reduce the number of germs on you skin by washing with CHG (chlorahexidine gluconate) soap before surgery.  CHG is an antiseptic cleaner which kills germs and bonds with the skin to continue killing germs even after washing.  Please DO NOT use if you have an allergy to CHG or antibacterial soaps.  If your skin becomes reddened/irritated stop using the CHG and inform your nurse when you arrive at Short Stay.  Do not shave (including legs and underarms) for at least 48 hours prior to the first CHG shower.   You may shave your face.  Please follow these instructions carefully:   1.  Shower with CHG Soap the night before surgery and the                                morning of Surgery.  2.  If you choose to wash your hair, wash your hair first as usual with your       normal shampoo.  3.  After you shampoo, rinse your hair and body thoroughly to remove the                      Shampoo.  4.  Use CHG as you would any other liquid soap.  You can apply chg directly       to the skin and wash gently with scrungie or a clean washcloth.  5.  Apply the CHG Soap to your body ONLY FROM THE NECK DOWN.        Do not use on open wounds or open sores.  Avoid contact with your eyes,       ears, mouth and genitals (private parts).  Wash genitals (private parts)       with your normal soap.  6.  Wash thoroughly, paying special attention  to the area where your surgery        will be performed.  7.  Thoroughly rinse your body with warm water from the neck down.  8.  DO NOT shower/wash with your normal soap after using and rinsing off       the CHG Soap.  9.  Pat yourself dry with a clean towel.            10.  Wear clean pajamas.            11.  Place clean sheets on your bed the night of your first shower and do not        sleep with pets.  Day of Surgery  Do not apply any lotions/deoderants the morning of surgery.  Please wear clean clothes to the hospital/surgery center.    Please read over the following fact sheets that you were given. Pain Booklet, Coughing and Deep Breathing and Surgical Site Infection Prevention

## 2015-12-10 NOTE — Progress Notes (Signed)
Cardiologist denies  Medical Md is Dr.Dibas Koirala  Echo/stress test/heart cath denies  EKG denies in past yr  CXR denies in past yr

## 2015-12-10 NOTE — Progress Notes (Signed)
Anesthesia Chart Review:  Pt is a 60 year old male scheduled for incisional hernia repair, insertion of mesh on 12/17/2015 with Abigail Miyamotoouglas Blackman, MD.   - PCP is Dibas Docia ChuckKoirala, MD.   PMH includes:  HTN, hyperlipidemia, OSA, post-op N/V.  Former smoker. BMI 31  Anesthesia history of difficult intubation.  See anesthesia record in Epic from 2014.  Video laryngoscope used successfully.   Preoperative labs reviewed.    EKG 12/10/15: Sinus bradycardia (54 bpm)  If no changes, I anticipate pt can proceed with surgery as scheduled.   Rica Mastngela Ailish Prospero, FNP-BC [facility]/Anesthesiology Phone: [phone] 12/10/2015 4:15 PM

## 2015-12-12 DIAGNOSIS — K645 Perianal venous thrombosis: Secondary | ICD-10-CM | POA: Diagnosis not present

## 2015-12-16 MED ORDER — DEXTROSE 5 % IV SOLN
3.0000 g | INTRAVENOUS | Status: DC
  Filled 2015-12-16 (×2): qty 3000

## 2015-12-16 MED ORDER — CEFAZOLIN SODIUM-DEXTROSE 2-4 GM/100ML-% IV SOLN
2.0000 g | Freq: Once | INTRAVENOUS | Status: AC
  Administered 2015-12-17: 2 g via INTRAVENOUS
  Filled 2015-12-16: qty 100

## 2015-12-16 NOTE — H&P (Signed)
[facility]  [facility] Patient #: [id number] DOB: 03-14-1955 Married / Language: English / Race: White Male   History of Present Illness Patient words: New-incisional hernia.  The patient is a 60 year old male who presents with an incisional hernia. This patient is referred to me by Dr. Darrow Bussingibas Velasquez for evaluation of incisional hernia. He had undergone an emergent laparotomy after being perforated on a routine colonoscopy back in 2008. He has since been doing well and has lost a lot of weight work now it is noticed incisional hernias which are getting larger and causing no discomfort or obstructive symptoms. He is otherwise without complaints. He apparently has had umbilical hernia repair with mesh before the emergency surgery.   Other Problems  Gastric Ulcer General anesthesia - complications Hemorrhoids High blood pressure Hypercholesterolemia Sleep Apnea Umbilical Hernia Repair  Past Surgical History ( Anal Fissure Repair Colon Removal - Partial Esophagomyotomy Oral Surgery Shoulder Surgery Right. Tonsillectomy Ventral / Umbilical Hernia Surgery Right.  Diagnostic Studies History  Colonoscopy within last year  Allergies ( No Known Drug Allergies08/16/2017  Medication History  ClonazePAM (0.5MG  Tablet, Oral) Active. Meloxicam (15MG  Tablet, Oral) Active. HydroCHLOROthiazide (25MG  Tablet, Oral) Active. Simvastatin (20MG  Tablet, Oral) Active. Aspirin (81MG  Tablet, Oral) Active. Medications Reconciled  Social History  Alcohol use Occasional alcohol use. Caffeine use Coffee. No drug use Tobacco use Former smoker.  Family History  Cancer Son. Heart Disease Father. Hypertension Father.    Review of Systems  General Not Present- Appetite Loss, Chills, Fatigue, Fever, Night Sweats, Weight Gain and Weight Loss. Skin Not Present- Change in Wart/Mole, Dryness, Hives, Jaundice, New Lesions, Non-Healing  Wounds, Rash and Ulcer. HEENT Present- Wears glasses/contact lenses. Not Present- Earache, Hearing Loss, Hoarseness, Nose Bleed, Oral Ulcers, Ringing in the Ears, Seasonal Allergies, Sinus Pain, Sore Throat, Visual Disturbances and Yellow Eyes. Respiratory Not Present- Bloody sputum, Chronic Cough, Difficulty Breathing, Snoring and Wheezing. Breast Not Present- Breast Mass, Breast Pain, Nipple Discharge and Skin Changes. Cardiovascular Not Present- Chest Pain, Difficulty Breathing Lying Down, Leg Cramps, Palpitations, Rapid Heart Rate, Shortness of Breath and Swelling of Extremities. Gastrointestinal Present- Change in Bowel Habits. Not Present- Abdominal Pain, Bloating, Bloody Stool, Chronic diarrhea, Constipation, Difficulty Swallowing, Excessive gas, Gets full quickly at meals, Hemorrhoids, Indigestion, Nausea, Rectal Pain and Vomiting. Male Genitourinary Not Present- Blood in Urine, Change in Urinary Stream, Frequency, Impotence, Nocturia, Painful Urination, Urgency and Urine Leakage. Musculoskeletal Not Present- Back Pain, Joint Pain, Joint Stiffness, Muscle Pain, Muscle Weakness and Swelling of Extremities. Neurological Not Present- Decreased Memory, Fainting, Headaches, Numbness, Seizures, Tingling, Tremor, Trouble walking and Weakness. Psychiatric Not Present- Anxiety, Bipolar, Change in Sleep Pattern, Depression, Fearful and Frequent crying. Endocrine Not Present- Cold Intolerance, Excessive Hunger, Hair Changes, Heat Intolerance, Hot flashes and New Diabetes. Hematology Not Present- Blood Thinners, Easy Bruising, Excessive bleeding, Gland problems, HIV and Persistent Infections.  Vitals   Weight: 253.2 lb Height: 75in Body Surface Area: 2.43 m Body Mass Index: 31.65 kg/m  Temp.: 76F(Oral)  Pulse: 56 (Regular)  BP: 120/84 (Sitting, Left Arm, Standard)    Physical Exam  General Mental Status-Alert. General Appearance-Consistent with stated age. Hydration-Well  hydrated. Voice-Normal.  Head and Neck Head-normocephalic, atraumatic with no lesions or palpable masses. Trachea-midline.  Eye Eyeball - Bilateral-Extraocular movements intact. Sclera/Conjunctiva - Bilateral-No scleral icterus.  Chest and Lung Exam Chest and lung exam reveals -quiet, even and easy respiratory effort with no use of accessory muscles and on auscultation, normal breath sounds, no adventitious sounds  and normal vocal resonance. Inspection Chest Wall - Normal. Back - normal.  Cardiovascular Cardiovascular examination reveals -normal heart sounds, regular rate and rhythm with no murmurs and normal pedal pulses bilaterally.  Abdomen Inspection Skin - Scar - no surgical scars. Hernias - Incisional - Reducible. Note: On examination, he has a large midline incision. There is a large, reducible hernia in the lower aspect of the incision. There is also a very small chronically incarcerated hernia above the umbilicus to the right of the midline. Palpation/Percussion Palpation and Percussion of the abdomen reveal - Soft, Non Tender, No Rebound tenderness, No Rigidity (guarding) and No hepatosplenomegaly. Auscultation Auscultation of the abdomen reveals - Bowel sounds normal.  Neurologic Neurologic evaluation reveals -alert and oriented x 3 with no impairment of recent or remote memory. Mental Status-Normal.  Musculoskeletal Normal Exam - Left-Upper Extremity Strength Normal and Lower Extremity Strength Normal. Normal Exam - Right-Upper Extremity Strength Normal, Lower Extremity Weakness.    Assessment & Plan   INCISIONAL HERNIA (K43.2)  Impression: I discussed the diagnosis with him in detail. Incisional hernia repair with mesh is recommended. I discussed both the open and laparoscopic techniques as well as the advantages and disadvantages of both. I believe he would best be served with an open incisional hernia repair with mesh with a possible  fascial release laterally as he is very active and does a lot of heavy lifting. I discussed the risk of surgery with him. These risks include but are not limited to bleeding, infection, injury to surrounding structures, use of mesh, hernia recurrence, cardiopulmonary issues, DVT, postoperative recovery etc. He understands and wishes to proceed with surgery

## 2015-12-17 ENCOUNTER — Inpatient Hospital Stay (HOSPITAL_COMMUNITY): Payer: 59 | Admitting: Emergency Medicine

## 2015-12-17 ENCOUNTER — Encounter (HOSPITAL_COMMUNITY): Payer: Self-pay | Admitting: *Deleted

## 2015-12-17 ENCOUNTER — Inpatient Hospital Stay (HOSPITAL_COMMUNITY): Payer: 59 | Admitting: Anesthesiology

## 2015-12-17 ENCOUNTER — Inpatient Hospital Stay (HOSPITAL_COMMUNITY)
Admission: RE | Admit: 2015-12-17 | Discharge: 2015-12-21 | DRG: 355 | Disposition: A | Discharge status: Home / Self Care | Payer: 59 | Source: Ambulatory Visit | Attending: Surgery | Admitting: Surgery

## 2015-12-17 ENCOUNTER — Encounter (HOSPITAL_COMMUNITY)
Admission: RE | Disposition: A | Discharge status: Home / Self Care | Payer: Self-pay | Source: Ambulatory Visit | Attending: Surgery

## 2015-12-17 DIAGNOSIS — Z8719 Personal history of other diseases of the digestive system: Secondary | ICD-10-CM

## 2015-12-17 DIAGNOSIS — K432 Incisional hernia without obstruction or gangrene: Principal | ICD-10-CM | POA: Diagnosis present

## 2015-12-17 DIAGNOSIS — Z9889 Other specified postprocedural states: Secondary | ICD-10-CM

## 2015-12-17 DIAGNOSIS — H33301 Unspecified retinal break, right eye: Secondary | ICD-10-CM | POA: Diagnosis not present

## 2015-12-17 DIAGNOSIS — Z87891 Personal history of nicotine dependence: Secondary | ICD-10-CM

## 2015-12-17 DIAGNOSIS — Z8249 Family history of ischemic heart disease and other diseases of the circulatory system: Secondary | ICD-10-CM | POA: Diagnosis not present

## 2015-12-17 DIAGNOSIS — Z7982 Long term (current) use of aspirin: Secondary | ICD-10-CM | POA: Diagnosis not present

## 2015-12-17 DIAGNOSIS — H4310 Vitreous hemorrhage, unspecified eye: Secondary | ICD-10-CM | POA: Diagnosis not present

## 2015-12-17 DIAGNOSIS — Z79899 Other long term (current) drug therapy: Secondary | ICD-10-CM | POA: Diagnosis not present

## 2015-12-17 DIAGNOSIS — E7800 Pure hypercholesterolemia, unspecified: Secondary | ICD-10-CM | POA: Diagnosis present

## 2015-12-17 DIAGNOSIS — G4730 Sleep apnea, unspecified: Secondary | ICD-10-CM | POA: Diagnosis present

## 2015-12-17 DIAGNOSIS — I10 Essential (primary) hypertension: Secondary | ICD-10-CM | POA: Diagnosis present

## 2015-12-17 DIAGNOSIS — Z8711 Personal history of peptic ulcer disease: Secondary | ICD-10-CM | POA: Diagnosis not present

## 2015-12-17 HISTORY — PX: INCISIONAL HERNIA REPAIR: SHX193

## 2015-12-17 HISTORY — PX: INSERTION OF MESH: SHX5868

## 2015-12-17 SURGERY — REPAIR, HERNIA, INCISIONAL
Anesthesia: General | Site: Abdomen

## 2015-12-17 MED ORDER — FENTANYL CITRATE (PF) 100 MCG/2ML IJ SOLN
INTRAMUSCULAR | Status: DC | PRN
  Administered 2015-12-17 (×4): 50 ug via INTRAVENOUS
  Administered 2015-12-17: 100 ug via INTRAVENOUS

## 2015-12-17 MED ORDER — ONDANSETRON HCL 4 MG/2ML IJ SOLN: 4.0000 mg | Freq: Four times a day (QID) | INTRAMUSCULAR | Status: DC | PRN

## 2015-12-17 MED ORDER — CLONAZEPAM 0.5 MG PO TABS
0.5000 mg | ORAL_TABLET | Freq: Every day | ORAL | Status: DC
  Administered 2015-12-17 – 2015-12-20 (×4): 0.5 mg via ORAL
  Filled 2015-12-17 (×4): qty 1

## 2015-12-17 MED ORDER — NALOXONE HCL 0.4 MG/ML IJ SOLN: 0.4000 mg | INTRAMUSCULAR | Status: DC | PRN

## 2015-12-17 MED ORDER — SUGAMMADEX SODIUM 500 MG/5ML IV SOLN
INTRAVENOUS | Status: AC
  Filled 2015-12-17: qty 5

## 2015-12-17 MED ORDER — HYDROMORPHONE 1 MG/ML IV SOLN
INTRAVENOUS | Status: AC
  Filled 2015-12-17: qty 25

## 2015-12-17 MED ORDER — DIPHENHYDRAMINE HCL 12.5 MG/5ML PO ELIX: 12.5000 mg | ORAL_SOLUTION | Freq: Four times a day (QID) | ORAL | Status: DC | PRN

## 2015-12-17 MED ORDER — ROCURONIUM BROMIDE 100 MG/10ML IV SOLN
INTRAVENOUS | Status: DC | PRN
  Administered 2015-12-17: 20 mg via INTRAVENOUS
  Administered 2015-12-17: 30 mg via INTRAVENOUS
  Administered 2015-12-17: 20 mg via INTRAVENOUS
  Administered 2015-12-17: 10 mg via INTRAVENOUS
  Administered 2015-12-17 (×2): 20 mg via INTRAVENOUS

## 2015-12-17 MED ORDER — SUGAMMADEX SODIUM 500 MG/5ML IV SOLN
INTRAVENOUS | Status: DC | PRN
  Administered 2015-12-17: 230 mg via INTRAVENOUS

## 2015-12-17 MED ORDER — SUCCINYLCHOLINE CHLORIDE 20 MG/ML IJ SOLN
INTRAMUSCULAR | Status: DC | PRN
  Administered 2015-12-17: 120 mg via INTRAVENOUS

## 2015-12-17 MED ORDER — POTASSIUM CHLORIDE IN NACL 20-0.9 MEQ/L-% IV SOLN
INTRAVENOUS | Status: DC
  Administered 2015-12-17 – 2015-12-19 (×5): via INTRAVENOUS
  Filled 2015-12-17 (×7): qty 1000

## 2015-12-17 MED ORDER — 0.9 % SODIUM CHLORIDE (POUR BTL) OPTIME
TOPICAL | Status: DC | PRN
  Administered 2015-12-17 (×2): 1000 mL

## 2015-12-17 MED ORDER — LIDOCAINE HCL (CARDIAC) 20 MG/ML IV SOLN
INTRAVENOUS | Status: DC | PRN
  Administered 2015-12-17: 60 mg via INTRAVENOUS

## 2015-12-17 MED ORDER — HYDROMORPHONE HCL 1 MG/ML IJ SOLN: 0.2500 mg | INTRAMUSCULAR | Status: DC | PRN

## 2015-12-17 MED ORDER — ONDANSETRON HCL 4 MG/2ML IJ SOLN
4.0000 mg | Freq: Four times a day (QID) | INTRAMUSCULAR | Status: DC | PRN
  Administered 2015-12-19: 4 mg via INTRAVENOUS
  Filled 2015-12-17 (×2): qty 2

## 2015-12-17 MED ORDER — ROCURONIUM BROMIDE 10 MG/ML (PF) SYRINGE
PREFILLED_SYRINGE | INTRAVENOUS | Status: AC
  Filled 2015-12-17: qty 10

## 2015-12-17 MED ORDER — ONDANSETRON HCL 4 MG/2ML IJ SOLN
INTRAMUSCULAR | Status: AC
  Filled 2015-12-17: qty 2

## 2015-12-17 MED ORDER — HYDROMORPHONE 1 MG/ML IV SOLN
INTRAVENOUS | Status: DC
  Administered 2015-12-17: 0.9 mg via INTRAVENOUS
  Administered 2015-12-17: 10:00:00 via INTRAVENOUS
  Administered 2015-12-17: 3.3 mg via INTRAVENOUS
  Administered 2015-12-17 – 2015-12-18 (×2): 1.2 mg via INTRAVENOUS
  Administered 2015-12-18: 1.6 mg via INTRAVENOUS
  Administered 2015-12-18 (×3): 0.6 mg via INTRAVENOUS
  Administered 2015-12-18: 1.2 mg via INTRAVENOUS
  Administered 2015-12-19 (×2): 0.9 mg via INTRAVENOUS

## 2015-12-17 MED ORDER — LACTATED RINGERS IV SOLN
INTRAVENOUS | Status: DC | PRN
  Administered 2015-12-17 (×2): via INTRAVENOUS

## 2015-12-17 MED ORDER — PROMETHAZINE HCL 25 MG/ML IJ SOLN: 6.2500 mg | INTRAMUSCULAR | Status: DC | PRN

## 2015-12-17 MED ORDER — ACETAMINOPHEN 10 MG/ML IV SOLN
INTRAVENOUS | Status: DC | PRN
  Administered 2015-12-17: 1000 mg via INTRAVENOUS

## 2015-12-17 MED ORDER — SUCCINYLCHOLINE CHLORIDE 200 MG/10ML IV SOSY
PREFILLED_SYRINGE | INTRAVENOUS | Status: AC
  Filled 2015-12-17: qty 10

## 2015-12-17 MED ORDER — DIPHENHYDRAMINE HCL 50 MG/ML IJ SOLN: 12.5000 mg | Freq: Four times a day (QID) | INTRAMUSCULAR | Status: DC | PRN

## 2015-12-17 MED ORDER — KETOROLAC TROMETHAMINE 30 MG/ML IJ SOLN
INTRAMUSCULAR | Status: AC
  Filled 2015-12-17: qty 1

## 2015-12-17 MED ORDER — OXYCODONE HCL 5 MG/5ML PO SOLN: 5.0000 mg | Freq: Once | ORAL | Status: DC | PRN

## 2015-12-17 MED ORDER — FENTANYL CITRATE (PF) 100 MCG/2ML IJ SOLN
INTRAMUSCULAR | Status: AC
  Filled 2015-12-17: qty 2

## 2015-12-17 MED ORDER — MIDAZOLAM HCL 2 MG/2ML IJ SOLN
INTRAMUSCULAR | Status: DC | PRN
  Administered 2015-12-17: 2 mg via INTRAVENOUS

## 2015-12-17 MED ORDER — SODIUM CHLORIDE 0.9% FLUSH: 9.0000 mL | INTRAVENOUS | Status: DC | PRN

## 2015-12-17 MED ORDER — OXYCODONE HCL 5 MG PO TABS: 5.0000 mg | ORAL_TABLET | Freq: Once | ORAL | Status: DC | PRN

## 2015-12-17 MED ORDER — PROPOFOL 10 MG/ML IV BOLUS
INTRAVENOUS | Status: DC | PRN
  Administered 2015-12-17: 150 mg via INTRAVENOUS

## 2015-12-17 MED ORDER — ACETAMINOPHEN 10 MG/ML IV SOLN
INTRAVENOUS | Status: AC
Start: 2015-12-17 — End: 2015-12-17
  Filled 2015-12-17: qty 100

## 2015-12-17 MED ORDER — ONDANSETRON 4 MG PO TBDP: 4.0000 mg | ORAL_TABLET | Freq: Four times a day (QID) | ORAL | Status: DC | PRN

## 2015-12-17 MED ORDER — ONDANSETRON HCL 4 MG/2ML IJ SOLN
INTRAMUSCULAR | Status: DC | PRN
  Administered 2015-12-17: 4 mg via INTRAVENOUS

## 2015-12-17 MED ORDER — ENOXAPARIN SODIUM 40 MG/0.4ML ~~LOC~~ SOLN
40.0000 mg | SUBCUTANEOUS | Status: DC
  Administered 2015-12-18 – 2015-12-21 (×4): 40 mg via SUBCUTANEOUS
  Filled 2015-12-17 (×4): qty 0.4

## 2015-12-17 MED ORDER — PROPOFOL 10 MG/ML IV BOLUS
INTRAVENOUS | Status: AC
  Filled 2015-12-17: qty 20

## 2015-12-17 MED ORDER — HYDROCHLOROTHIAZIDE 25 MG PO TABS
25.0000 mg | ORAL_TABLET | Freq: Every day | ORAL | Status: DC
  Administered 2015-12-18 – 2015-12-21 (×4): 25 mg via ORAL
  Filled 2015-12-17 (×4): qty 1

## 2015-12-17 MED ORDER — LIDOCAINE 2% (20 MG/ML) 5 ML SYRINGE
INTRAMUSCULAR | Status: AC
  Filled 2015-12-17: qty 5

## 2015-12-17 MED ORDER — MIDAZOLAM HCL 2 MG/2ML IJ SOLN
INTRAMUSCULAR | Status: AC
  Filled 2015-12-17: qty 2

## 2015-12-17 MED ORDER — METHOCARBAMOL 500 MG PO TABS
500.0000 mg | ORAL_TABLET | Freq: Four times a day (QID) | ORAL | Status: DC | PRN
Start: 2015-12-17 — End: 2015-12-21
  Administered 2015-12-17 – 2015-12-20 (×3): 500 mg via ORAL
  Filled 2015-12-17 (×3): qty 1

## 2015-12-17 MED ORDER — KETOROLAC TROMETHAMINE 30 MG/ML IJ SOLN
INTRAMUSCULAR | Status: DC | PRN
  Administered 2015-12-17: 30 mg via INTRAVENOUS

## 2015-12-17 SURGICAL SUPPLY — 55 items
ADH SKN CLS APL DERMABOND .7 (GAUZE/BANDAGES/DRESSINGS) ×1
BINDER ABDOMINAL 10 UNV 27-48 (MISCELLANEOUS) ×2 IMPLANT
BLADE SURG 11 STRL SS (BLADE) ×2 IMPLANT
BLADE SURG ROTATE 9660 (MISCELLANEOUS) ×2 IMPLANT
CANISTER SUCTION 2500CC (MISCELLANEOUS) ×3 IMPLANT
CHLORAPREP W/TINT 26ML (MISCELLANEOUS) ×3 IMPLANT
COVER SURGICAL LIGHT HANDLE (MISCELLANEOUS) ×3 IMPLANT
DERMABOND ADVANCED (GAUZE/BANDAGES/DRESSINGS) ×2
DERMABOND ADVANCED .7 DNX12 (GAUZE/BANDAGES/DRESSINGS) IMPLANT
DEVICE TROCAR PUNCTURE CLOSURE (ENDOMECHANICALS) ×2 IMPLANT
DRAIN CHANNEL 19F RND (DRAIN) ×2 IMPLANT
DRAPE LAPAROSCOPIC ABDOMINAL (DRAPES) ×3 IMPLANT
DRSG PAD ABDOMINAL 8X10 ST (GAUZE/BANDAGES/DRESSINGS) ×4 IMPLANT
DRSG TEGADERM 4X4.75 (GAUZE/BANDAGES/DRESSINGS) ×1 IMPLANT
ELECT CAUTERY BLADE 6.4 (BLADE) ×3 IMPLANT
ELECT REM PT RETURN 9FT ADLT (ELECTROSURGICAL) ×3
ELECTRODE REM PT RTRN 9FT ADLT (ELECTROSURGICAL) ×1 IMPLANT
EVACUATOR SILICONE 100CC (DRAIN) ×2 IMPLANT
GAUZE SPONGE 4X4 12PLY STRL (GAUZE/BANDAGES/DRESSINGS) ×3 IMPLANT
GLOVE BIOGEL PI IND STRL 7.0 (GLOVE) IMPLANT
GLOVE BIOGEL PI IND STRL 7.5 (GLOVE) IMPLANT
GLOVE BIOGEL PI IND STRL 8 (GLOVE) IMPLANT
GLOVE BIOGEL PI INDICATOR 7.0 (GLOVE) ×4
GLOVE BIOGEL PI INDICATOR 7.5 (GLOVE) ×2
GLOVE BIOGEL PI INDICATOR 8 (GLOVE) ×2
GLOVE ECLIPSE 7.5 STRL STRAW (GLOVE) ×2 IMPLANT
GLOVE ECLIPSE 8.0 STRL XLNG CF (GLOVE) ×2 IMPLANT
GLOVE SURG SIGNA 7.5 PF LTX (GLOVE) ×3 IMPLANT
GLOVE SURG SS PI 7.0 STRL IVOR (GLOVE) ×4 IMPLANT
GOWN STRL REUS W/ TWL LRG LVL3 (GOWN DISPOSABLE) ×1 IMPLANT
GOWN STRL REUS W/ TWL XL LVL3 (GOWN DISPOSABLE) ×1 IMPLANT
GOWN STRL REUS W/TWL LRG LVL3 (GOWN DISPOSABLE) ×3
GOWN STRL REUS W/TWL XL LVL3 (GOWN DISPOSABLE) ×3
KIT BASIN OR (CUSTOM PROCEDURE TRAY) ×3 IMPLANT
KIT ROOM TURNOVER OR (KITS) ×3 IMPLANT
MESH VENTRALIGHT ST 6X8 (Mesh Specialty) ×3 IMPLANT
MESH VENTRLGHT ELLIPSE 8X6XMFL (Mesh Specialty) IMPLANT
NS IRRIG 1000ML POUR BTL (IV SOLUTION) ×3 IMPLANT
PACK GENERAL/GYN (CUSTOM PROCEDURE TRAY) ×3 IMPLANT
PAD ARMBOARD 7.5X6 YLW CONV (MISCELLANEOUS) ×3 IMPLANT
SPONGE LAP 18X18 X RAY DECT (DISPOSABLE) ×2 IMPLANT
STAPLER VISISTAT 35W (STAPLE) IMPLANT
SUT ETHILON 2 0 FS 18 (SUTURE) ×2 IMPLANT
SUT MNCRL AB 4-0 PS2 18 (SUTURE) ×5 IMPLANT
SUT NOVA NAB DX-16 0-1 5-0 T12 (SUTURE) ×4 IMPLANT
SUT PDS AB 1 TP1 96 (SUTURE) ×2 IMPLANT
SUT PROLENE 1 CT (SUTURE) IMPLANT
SUT SILK 3 0 SH CR/8 (SUTURE) ×2 IMPLANT
SUT VIC AB 2-0 SH 27 (SUTURE) ×12
SUT VIC AB 2-0 SH 27X BRD (SUTURE) IMPLANT
SUT VIC AB 3-0 SH 18 (SUTURE) ×2 IMPLANT
SUT VIC AB 3-0 SH 27 (SUTURE) ×3
SUT VIC AB 3-0 SH 27XBRD (SUTURE) ×1 IMPLANT
TOWEL OR 17X26 10 PK STRL BLUE (TOWEL DISPOSABLE) ×3 IMPLANT
TRAY FOLEY CATH 14FRSI W/METER (CATHETERS) ×2 IMPLANT

## 2015-12-17 NOTE — Interval H&P Note (Signed)
History and Physical Interval Note: no change in H and P  12/17/2015 7:13 AM  Ian DamesMark J Stefanick  has presented today for surgery, with the diagnosis of incisonal hernia  The various methods of treatment have been discussed with the patient and family. After consideration of risks, benefits and other options for treatment, the patient has consented to  Procedure(s): HERNIA REPAIR INCISIONAL (N/A) INSERTION OF MESH (N/A) as a surgical intervention .  The patient's history has been reviewed, patient examined, no change in status, stable for surgery.  I have reviewed the patient's chart and labs.  Questions were answered to the patient's satisfaction.     Niza Soderholm A

## 2015-12-17 NOTE — Anesthesia Preprocedure Evaluation (Signed)
Anesthesia Evaluation  Patient identified by MRN, date of birth, ID band Patient awake    Reviewed: Allergy & Precautions, NPO status , Patient's Chart, lab work & pertinent test results  History of Anesthesia Complications (+) DIFFICULT AIRWAY  Airway Mallampati: III  TM Distance: <3 FB Neck ROM: Full    Dental no notable dental hx.    Pulmonary sleep apnea , former smoker,    Pulmonary exam normal breath sounds clear to auscultation       Cardiovascular hypertension, negative cardio ROS Normal cardiovascular exam Rhythm:Regular Rate:Normal     Neuro/Psych negative neurological ROS  negative psych ROS   GI/Hepatic negative GI ROS, Neg liver ROS,   Endo/Other  negative endocrine ROS  Renal/GU negative Renal ROS  negative genitourinary   Musculoskeletal negative musculoskeletal ROS (+)   Abdominal   Peds negative pediatric ROS (+)  Hematology negative hematology ROS (+)   Anesthesia Other Findings   Reproductive/Obstetrics negative OB ROS                             Anesthesia Physical Anesthesia Plan  ASA: III  Anesthesia Plan: General   Post-op Pain Management:    Induction: Intravenous  Airway Management Planned: Oral ETT and Video Laryngoscope Planned  Additional Equipment:   Intra-op Plan:   Post-operative Plan: Extubation in OR  Informed Consent: I have reviewed the patients History and Physical, chart, labs and discussed the procedure including the risks, benefits and alternatives for the proposed anesthesia with the patient or authorized representative who has indicated his/her understanding and acceptance.   Dental advisory given  Plan Discussed with: CRNA and Surgeon  Anesthesia Plan Comments:         Anesthesia Quick Evaluation

## 2015-12-17 NOTE — Anesthesia Procedure Notes (Signed)
Procedure Name: Intubation Date/Time: 12/17/2015 7:34 AM Performed by: Jefm MilesENNIE, Sayge Salvato E Pre-anesthesia Checklist: Patient identified, Emergency Drugs available, Suction available and Patient being monitored Patient Re-evaluated:Patient Re-evaluated prior to inductionOxygen Delivery Method: Circle System Utilized Preoxygenation: Pre-oxygenation with 100% oxygen Intubation Type: IV induction and Rapid sequence Ventilation: Mask ventilation without difficulty Laryngoscope Size: Glidescope and 3 Grade View: Grade I Tube type: Oral Tube size: 7.5 mm Number of attempts: 1 Airway Equipment and Method: Stylet,  Oral airway and Video-laryngoscopy Placement Confirmation: ETT inserted through vocal cords under direct vision,  positive ETCO2 and breath sounds checked- equal and bilateral Secured at: 22 cm Tube secured with: Tape Dental Injury: Teeth and Oropharynx as per pre-operative assessment  Difficulty Due To: Difficulty was anticipated and Difficult Airway- due to limited oral opening Future Recommendations: Recommend- induction with short-acting agent, and alternative techniques readily available

## 2015-12-17 NOTE — Op Note (Signed)
HERNIA REPAIR INCISIONAL, INSERTION OF MESH  Procedure Note  Ian DamesMark J Yniguez 12/17/2015   Pre-op Diagnosis: INCISIONAL HERNIA     Post-op Diagnosis: same  Procedure(s): HERNIA REPAIR INCISIONAL INSERTION OF MESH (15 cm x 20 cm Ventralite from Bard)  Surgeon(s): Abigail Miyamotoouglas Loghan Kurtzman, MD Avel Peaceodd Rosenbower, MD  Anesthesia: General  Staff:  Circulator: Julieta BelliniJamie S Haralam, RN Relief Circulator: Doy MinceSharon P Hitchcock, RN Relief Scrub: Doy MinceSharon P Hitchcock, RN Scrub Person: Royann ShiversPeyton Lloyd, RN RN First Assistant: Doy MinceSharon P Hitchcock, RN  Estimated Blood Loss: 50 cc               Indications:  This is a 60 year old gentleman who presents with a ventral incisional hernia who is being scheduled for repair with mesh  Findings: Patient had a moderate-sized fascial defect below the umbilicus as well as a tiny one above the umbilicus. The hernia was repaired with an underlay 15 cm x 20 cm piece of ventralite mesh from Bard  Procedure in detail: The patient was brought to the operating room and identified as the correct patient. He is placed upon the operative table and general anesthesia was induced. A Foley catheter was inserted. His abdomen was prepped and draped in usual sterile fashion. He had a large midline scar which I completely excised with electrocautery. I removed the scar only down to the fascia with electrocautery. I then opened the fascia at the hernia sac at midline of the umbilicus. The patient had minimal adhesions to the abdominal wall. I took these down with electrocautery. There was one piece of small intestine stuck abdominal [place] down with Metzenbaum scissors. I closed a small tear in the serosa with interrupted 3-0 silk sutures. I can palpate a small fascial defect above the umbilicus. The patient's fascia was actually fairly lax. At this point I separated the peritoneum from the overlying rectus muscle. I was able to mobilize the peritoneum circumferentially. I was unable to close  several small holes in the peritoneum with 2-0 Vicryl sutures. I was able to get mobile greater than 5 cm circumferentially around fascia. I then measured the size of the fascia opening in defect. I then brought a 15 cm x 20 cm round ventral mesh from Bard onto the field. I placed 6 separate #1 Novafil sutures into the mesh. I closed the patient's peritoneum in the midline with a running 2-0 Vicryl suture. I then placed the mesh of the top of this. I used the suture passer to pull the Novafil sutures out through both the upper and lower midline as well as separate stab incisions in the lateral abdomen. Once the sutures and tied in place the secure the mesh underneath the rectus. Wide coverage of the fascia hernia defect appeared to be achieved greater than 5 cm circumferentially. I then irrigated the mesh with saline. I made a separate skin incision place a [address] [place] [address] drain over the top of the mesh. This was secured in place with a nylon suture. I only had mobilized the skin slightly on one side of the incision with the cautery. I was able to close the fascia over top of mesh with both a running #1 loop PDS suture as well as interrupted #1 Novafil sutures. The skin was then irrigated. The subcutaneous tissue was rib proximal meta-3-0 Vicryl sutures and skin was closed with running 4-0 Monocryl. Skin glue was then applied to all incisions. The patient tolerated procedure well. All the counts were correct at the end of procedure. Patient was the  next abated in the operating room and taken in a stable condition to the recovery room. An abdominal binder was placed.          Daschel Roughton A   Date: 12/17/2015  Time: 9:22 AM

## 2015-12-17 NOTE — Transfer of Care (Signed)
Immediate Anesthesia Transfer of Care Note  Patient: Ian Velasquez  Procedure(s) Performed: Procedure(s): HERNIA REPAIR INCISIONAL (N/A) INSERTION OF MESH (N/A)  Patient Location: PACU  Anesthesia Type:General  Level of Consciousness: awake and oriented  Airway & Oxygen Therapy: Patient Spontanous Breathing and Patient connected to nasal cannula oxygen  Post-op Assessment: Report given to RN  Post vital signs: Reviewed and stable  Last Vitals:  Vitals:   12/17/15 0608  BP: (!) 117/59  Pulse: (!) 54  Resp: 20  Temp: 37.1 C    Last Pain:  Vitals:   12/17/15 0608  TempSrc: Oral      Patients Stated Pain Goal: 4 (12/17/15 0630)  Complications: No apparent anesthesia complications

## 2015-12-17 NOTE — Anesthesia Postprocedure Evaluation (Signed)
Anesthesia Post Note  Patient: Ian Velasquez  Procedure(s) Performed: Procedure(s) (LRB): HERNIA REPAIR INCISIONAL (N/A) INSERTION OF MESH (N/A)  Patient location during evaluation: PACU Anesthesia Type: General Level of consciousness: awake and alert Pain management: pain level controlled Vital Signs Assessment: post-procedure vital signs reviewed and stable Respiratory status: spontaneous breathing, nonlabored ventilation, respiratory function stable and patient connected to nasal cannula oxygen Cardiovascular status: blood pressure returned to baseline and stable Postop Assessment: no signs of nausea or vomiting Anesthetic complications: no    Last Vitals:  Vitals:   12/17/15 0937 12/17/15 0940  BP:  119/63  Pulse: 64 (!) 51  Resp: 13 15  Temp: 36.2 C     Last Pain:  Vitals:   12/17/15 0608  TempSrc: Oral                 [name]

## 2015-12-18 ENCOUNTER — Encounter (HOSPITAL_COMMUNITY): Payer: Self-pay | Admitting: General Practice

## 2015-12-18 LAB — BASIC METABOLIC PANEL
Anion gap: 6 (ref 5–15)
BUN: 15 mg/dL (ref 6–20)
CALCIUM: 8.7 mg/dL — AB (ref 8.9–10.3)
CHLORIDE: 105 mmol/L (ref 101–111)
CO2: 27 mmol/L (ref 22–32)
CREATININE: 1.05 mg/dL (ref 0.61–1.24)
GFR calc non Af Amer: >60 mL/min (ref >60)
GLUCOSE: 121 mg/dL — AB (ref 65–99)
Potassium: 4 mmol/L (ref 3.5–5.1)
Sodium: 138 mmol/L (ref 135–145)

## 2015-12-18 LAB — CBC
HCT: 36.6 % — ABNORMAL LOW (ref 39.0–52.0)
Hemoglobin: 11.8 g/dL — ABNORMAL LOW (ref 13.0–17.0)
MCH: 25.5 pg — AB (ref 26.0–34.0)
MCHC: 32.2 g/dL (ref 30.0–36.0)
MCV: 79.2 fL (ref 78.0–100.0)
PLATELETS: 182 10*3/uL (ref 150–400)
RBC: 4.62 MIL/uL (ref 4.22–5.81)
RDW: 13.6 % (ref 11.5–15.5)
WBC: 8.9 10*3/uL (ref 4.0–10.5)

## 2015-12-18 NOTE — Progress Notes (Signed)
Pt. Has had no nausea/vomiting and tolerating food well. Diet moved to soft diet for in the morning.

## 2015-12-18 NOTE — Progress Notes (Signed)
Pt. States he will rest a little bit and then let us know when he's ready to walk.

## 2015-12-18 NOTE — Progress Notes (Signed)
1 Day Post-Op  Subjective: Comfortable this morning. Difficult getting out of bed secondary to pain  Objective: Vital signs in last 24 hours: Temp:  [97 F (36.1 C)-98.9 F (37.2 C)] 98.8 F (37.1 C) (11/14 0610) Pulse Rate:  [48-81] 63 (11/14 0610) Resp:  [11-18] 18 (11/14 0610) BP: (104-161)/(48-78) 131/65 (11/14 0610) SpO2:  [96 %-100 %] 98 % (11/14 0610) Last BM Date: 12/17/15  Intake/Output from previous day: 11/13 0701 - 11/14 0700 In: 4125 [P.O.:750; I.V.:3375] Out: 1755 [Urine:1475; Drains:180; Blood:100] Intake/Output this shift: Total I/O In: 2145 [P.O.:270; I.V.:1875] Out: 1270 [Urine:1150; Drains:120]  Exam: Looks comfortable Abdomen soft, drain serosang  Lab Results:   Recent Labs  12/18/15 0200  WBC 8.9  HGB 11.8*  HCT 36.6*  PLT 182   BMET  Recent Labs  12/18/15 0200  NA 138  K 4.0  CL 105  CO2 27  GLUCOSE 121*  BUN 15  CREATININE 1.05  CALCIUM 8.7*   PT/INR No results for input(s): LABPROT, INR in the last 72 hours. ABG No results for input(s): PHART, HCO3 in the last 72 hours.  Invalid input(s): PCO2, PO2  Studies/Results: No results found.  Anti-infectives: Anti-infectives    Start     Dose/Rate Route Frequency Ordered Stop   12/17/15 0600  ceFAZolin (ANCEF) 3 g in dextrose 5 % 50 mL IVPB  Status:  Discontinued     3 g 130 mL/hr over 30 Minutes Intravenous On call to O.R. 12/16/15 1555 12/16/15 2331   12/17/15 0600  ceFAZolin (ANCEF) IVPB 2g/100 mL premix     2 g 200 mL/hr over 30 Minutes Intravenous  Once 12/16/15 2331 12/17/15 0735      Assessment/Plan: s/p Procedure(s): HERNIA REPAIR INCISIONAL (N/A) INSERTION OF MESH (N/A)  D/c foley Advance diet ambulate  LOS: 1 day    Reata Petrov A 12/18/2015

## 2015-12-18 NOTE — Progress Notes (Signed)
Pt. Stood up in room and sat on side of the bed for a little while, however, pt. Did not want to walk around.

## 2015-12-19 MED ORDER — OXYCODONE HCL 5 MG PO TABS
5.0000 mg | ORAL_TABLET | ORAL | Status: DC | PRN
  Administered 2015-12-19 – 2015-12-21 (×11): 10 mg via ORAL
  Filled 2015-12-19 (×11): qty 2

## 2015-12-19 MED ORDER — KETOROLAC TROMETHAMINE 30 MG/ML IJ SOLN
30.0000 mg | Freq: Three times a day (TID) | INTRAMUSCULAR | Status: AC
  Administered 2015-12-19 (×3): 30 mg via INTRAVENOUS
  Filled 2015-12-19 (×3): qty 1

## 2015-12-19 MED ORDER — HYDROMORPHONE HCL 2 MG/ML IJ SOLN: 1.0000 mg | INTRAMUSCULAR | Status: DC | PRN

## 2015-12-19 MED ORDER — POLYETHYLENE GLYCOL 3350 17 G PO PACK
17.0000 g | PACK | Freq: Every day | ORAL | Status: DC
  Administered 2015-12-19 – 2015-12-21 (×3): 17 g via ORAL
  Filled 2015-12-19 (×3): qty 1

## 2015-12-19 NOTE — Progress Notes (Signed)
2 Days Post-Op  Subjective: Still with moderate pain Denies nausea  Objective: Vital signs in last 24 hours: Temp:  [98.4 F (36.9 C)-99.3 F (37.4 C)] 98.8 F (37.1 C) (11/15 0545) Pulse Rate:  [62-66] 66 (11/15 0545) Resp:  [15-18] 15 (11/15 0400) BP: (117-131)/(58-68) 124/59 (11/15 0545) SpO2:  [97 %-100 %] 98 % (11/15 0545) Last BM Date: 12/17/15  Intake/Output from previous day: 11/14 0701 - 11/15 0700 In: 2493.3 [P.O.:540; I.V.:1953.3] Out: 2680 [Urine:2600; Drains:80] Intake/Output this shift: Total I/O In: 2493.3 [P.O.:540; I.V.:1953.3] Out: 1430 [Urine:1350; Drains:80]  Exam: Appears comfortable Abdomen soft, non distended, drain serosangenous  Lab Results:   Recent Labs  12/18/15 0200  WBC 8.9  HGB 11.8*  HCT 36.6*  PLT 182   BMET  Recent Labs  12/18/15 0200  NA 138  K 4.0  CL 105  CO2 27  GLUCOSE 121*  BUN 15  CREATININE 1.05  CALCIUM 8.7*   PT/INR No results for input(s): LABPROT, INR in the last 72 hours. ABG No results for input(s): PHART, HCO3 in the last 72 hours.  Invalid input(s): PCO2, PO2  Studies/Results: No results found.  Anti-infectives: Anti-infectives    Start     Dose/Rate Route Frequency Ordered Stop   12/17/15 0600  ceFAZolin (ANCEF) 3 g in dextrose 5 % 50 mL IVPB  Status:  Discontinued     3 g 130 mL/hr over 30 Minutes Intravenous On call to O.R. 12/16/15 1555 12/16/15 2331   12/17/15 0600  ceFAZolin (ANCEF) IVPB 2g/100 mL premix     2 g 200 mL/hr over 30 Minutes Intravenous  Once 12/16/15 2331 12/17/15 0735      Assessment/Plan: s/p Procedure(s): HERNIA REPAIR INCISIONAL (N/A) INSERTION OF MESH (N/A)  D/c pca Oral and iv pain meds Stool softener ambulate  LOS: 2 days    Ian Velasquez A 12/19/2015

## 2015-12-20 NOTE — Progress Notes (Signed)
3 Days Post-Op  Subjective: More comfortable today Passing flatus  Objective: Vital signs in last 24 hours: Temp:  [98.1 F (36.7 C)-98.8 F (37.1 C)] 98.1 F (36.7 C) (11/16 0530) Pulse Rate:  [54-68] 54 (11/16 0530) Resp:  [18] 18 (11/16 0530) BP: (117-133)/(59-65) 117/59 (11/16 0530) SpO2:  [95 %-100 %] 100 % (11/16 0530) Last BM Date: 12/17/15  Intake/Output from previous day: 11/15 0701 - 11/16 0700 In: 1817.5 [I.V.:1817.5] Out: 1125 [Urine:1050; Drains:75] Intake/Output this shift: Total I/O In: 1817.5 [I.V.:1817.5] Out: 450 [Urine:400; Drains:50]  Exam: Abdomen soft, incision clean Drain serous  Lab Results:   Recent Labs  12/18/15 0200  WBC 8.9  HGB 11.8*  HCT 36.6*  PLT 182   BMET  Recent Labs  12/18/15 0200  NA 138  K 4.0  CL 105  CO2 27  GLUCOSE 121*  BUN 15  CREATININE 1.05  CALCIUM 8.7*   PT/INR No results for input(s): LABPROT, INR in the last 72 hours. ABG No results for input(s): PHART, HCO3 in the last 72 hours.  Invalid input(s): PCO2, PO2  Studies/Results: No results found.  Anti-infectives: Anti-infectives    Start     Dose/Rate Route Frequency Ordered Stop   12/17/15 0600  ceFAZolin (ANCEF) 3 g in dextrose 5 % 50 mL IVPB  Status:  Discontinued     3 g 130 mL/hr over 30 Minutes Intravenous On call to O.R. 12/16/15 1555 12/16/15 2331   12/17/15 0600  ceFAZolin (ANCEF) IVPB 2g/100 mL premix     2 g 200 mL/hr over 30 Minutes Intravenous  Once 12/16/15 2331 12/17/15 0735      Assessment/Plan: s/p Procedure(s): HERNIA REPAIR INCISIONAL (N/Velasquez) INSERTION OF MESH (N/Velasquez)  Decrease IVF  Awaiting BM Ambulate Hopefully home in next 1 to 2 days  LOS: 3 days    Ian Velasquez 12/20/2015

## 2015-12-21 ENCOUNTER — Other Ambulatory Visit: Payer: Self-pay | Admitting: *Deleted

## 2015-12-21 DIAGNOSIS — K432 Incisional hernia without obstruction or gangrene: Secondary | ICD-10-CM

## 2015-12-21 MED ORDER — OXYCODONE HCL 5 MG PO TABS: 5.0000 mg | ORAL_TABLET | ORAL | 0 refills | Status: AC | PRN

## 2015-12-21 MED FILL — oxyCODONE HCL 5 MG TABS: 5 | 4 days supply | Qty: 50 | Fill #0

## 2015-12-21 NOTE — Discharge Instructions (Signed)
[facility]      [facility] [phone]  OPEN ABDOMINAL SURGERY: POST OP INSTRUCTIONS  Always review your discharge instruction sheet given to you by the facility where your surgery was performed.  IF YOU HAVE DISABILITY OR FAMILY LEAVE FORMS, YOU MUST BRING THEM TO THE OFFICE FOR PROCESSING.  PLEASE DO NOT GIVE THEM TO YOUR DOCTOR.  1. A prescription for pain medication may be given to you upon discharge.  Take your pain medication as prescribed, if needed.  If narcotic pain medicine is not needed, then you may take acetaminophen (Tylenol) or ibuprofen (Advil) as needed. 2. Take your usually prescribed medications unless otherwise directed. 3. If you need a refill on your pain medication, please contact your pharmacy. They will contact our office to request authorization.  Prescriptions will not be filled after 5pm or on week-ends. 4. You should follow a light diet the first few days after arrival home, such as soup and crackers, pudding, etc.unless your doctor has advised otherwise. A high-fiber, low fat diet can be resumed as tolerated.   Be sure to include lots of fluids daily. Most patients will experience some swelling and bruising on the chest and neck area.  Ice packs will help.  Swelling and bruising can take several days to resolve 5. Most patients will experience some swelling and bruising in the area of the incision. Ice pack will help. Swelling and bruising can take several days to resolve..  6. It is common to experience some constipation if taking pain medication after surgery.  Increasing fluid intake and taking a stool softener will usually help or prevent this problem from occurring.  A mild laxative (Milk of Magnesia or Miralax) should be taken according to package directions if there are no bowel movements after 48 hours. 7.  You may have steri-strips (small skin tapes) in place directly over the incision.  These strips should be left on the skin for 7-10 days.  If your  surgeon used skin glue on the incision, you may shower in 24 hours.  The glue will flake off over the next 2-3 weeks.  Any sutures or staples will be removed at the office during your follow-up visit. You may find that a light gauze bandage over your incision may keep your staples from being rubbed or pulled. You may shower and replace the bandage daily. 8. ACTIVITIES:  You may resume regular (light) daily activities beginning the next day--such as daily self-care, walking, climbing stairs--gradually increasing activities as tolerated.  You may have sexual intercourse when it is comfortable.  Refrain from any heavy lifting or straining until approved by your doctor. a. You may drive when you no longer are taking prescription pain medication, you can comfortably wear a seatbelt, and you can safely maneuver your car and apply brakes b. Return to Work: ___________________________________ 9. You should see your doctor in the office for a follow-up appointment approximately two weeks after your surgery.  Make sure that you call for this appointment within a day or two after you arrive home to insure a convenient appointment time. OTHER INSTRUCTIONS:  _____________________________________________________________ _____________________________________________________________  WHEN TO CALL YOUR DOCTOR: 1. Fever over 101.0 2. Inability to urinate 3. Nausea and/or vomiting 4. Extreme swelling or bruising 5. Continued bleeding from incision. 6. Increased pain, redness, or drainage from the incision. 7. Difficulty swallowing or breathing 8. Muscle cramping or spasms. 9. Numbness or tingling in hands or feet or around lips.  The clinic staff is available to   answer your questions during regular business hours.  Please don't hesitate to call and ask to speak to one of the nurses if you have concerns.  For further questions, please visit [email or web address]   

## 2015-12-21 NOTE — Consult Note (Signed)
   [facility]   12/21/2015  Bryson DamesMark J Stimpson [birth year] [id number]    Patient screened for Link to [facility]Wellness/[facility] Care Management program for [employer] employees/dependents with [facility] insurance. Mr. Cathleen FearsWilcox discharged prior to bedside visit. Will request for post discharge call.    Raiford NobleAtika Sanad Fearnow, MSN-Ed, RN,BSN [facility] Care Management Hospital Liaison [phone]

## 2015-12-21 NOTE — Progress Notes (Signed)
Patient ID: Bryson DamesMark J Seese, male   DOB: 1955/03/27, 60 y.o.   MRN: [id number]   Doing well Wants to go home Abdomen soft, incision clean  Plan: Discharge home with drain

## 2015-12-21 NOTE — Discharge Summary (Signed)
Physician Discharge Summary  Patient ID: Ian Velasquez MRN: [id number] DOB/AGE: 1955/06/04 60 y.o.  Admit date: 12/17/2015 Discharge date: 12/21/2015  Admission Diagnoses:  Discharge Diagnoses:  Active Problems:   History of incisional hernia repair   Discharged Condition: good  Hospital Course: uneventful post op recovery.  Discharged home POD#4 with drain  Consults: None  Significant Diagnostic Studies:   Treatments: surgery: incisional hernia repair with mesh  Discharge Exam: Blood pressure 125/65, pulse (!) 58, temperature 98.2 F (36.8 C), temperature source Oral, resp. rate 18, SpO2 97 %. General appearance: alert, cooperative and no distress Resp: clear to auscultation bilaterally Cardio: regular rate and rhythm, S1, S2 normal, no murmur, click, rub or gallop Incision/Wound:abdomen soft, incision healing well, drain serous  Disposition: 01-Home or Self Care     Medication List    TAKE these medications   aspirin EC 81 MG tablet Take 81 mg by mouth daily.   clonazePAM 0.5 MG tablet Commonly known as:  KLONOPIN Take 0.5 mg by mouth at bedtime.   hydrochlorothiazide 25 MG tablet Commonly known as:  HYDRODIURIL Take 25 mg by mouth every morning.   meloxicam 15 MG tablet Commonly known as:  MOBIC Take 15 mg by mouth daily as needed for pain.   omeprazole 20 MG capsule Commonly known as:  PRILOSEC Take 20 mg by mouth daily.   oxyCODONE 5 MG immediate release tablet Commonly known as:  Oxy IR/ROXICODONE Take 1-2 tablets (5-10 mg total) by mouth every 4 (four) hours as needed for moderate pain.   simvastatin 20 MG tablet Commonly known as:  ZOCOR Take 20 mg by mouth daily.      Follow-up Information    Shaneen Reeser A, MD Follow up on 12/26/2015.   Specialty:  General Surgery Why:  call to confirm time.  drain removal Contact information: [address] [place] [address] [phone]           Signed: Shelly RubensteinBLACKMAN,Caedyn Tassinari  A 12/21/2015, 6:55 AM

## 2015-12-25 MED FILL — OMEPRAZOLE DR 20 MG CAPSULE: 20 | 30 days supply | Qty: 30 | Fill #0 | Status: TO

## 2015-12-26 MED FILL — oxyCODONE HCL 5 MG TABS: 5 | 3 days supply | Qty: 30 | Fill #0

## 2016-01-08 MED FILL — SIMVASTATIN 20 MG TABLET: 20 | 30 days supply | Qty: 30 | Fill #1

## 2016-01-08 MED FILL — clonazePAM 0.5 MG TABS: 0.5 | 30 days supply | Qty: 30 | Fill #1

## 2016-01-09 MED FILL — traMADol HCL 50 MG TABS: 50 | 8 days supply | Qty: 60 | Fill #0

## 2016-01-15 ENCOUNTER — Encounter: Payer: Self-pay | Admitting: *Deleted

## 2016-01-15 ENCOUNTER — Other Ambulatory Visit: Payer: Self-pay | Admitting: *Deleted

## 2016-01-15 NOTE — Patient Outreach (Addendum)
[facility]) Care Management  01/15/2016  Ian DamesMark J Velasquez 1955-02-16 [id number]  Subjective: Telephone call to patient's home number, message states reach a number that has been disconnected or invalid, and unable to leave a message.  Telephone call to patient's mobile number, spoke with patient, and HIPAA verified.   Discussed [facility] Care [facility]MR Transition of care follow up and Link to Wellness program.   Patient voices understanding and is in agreement to complete follow up.   States he does not have any Link to Wellness needs.  Patient states he is doing okay and does not like giving out personal information over the phone.  Had his hospital follow up appointment with surgeon on 12/26/15 and next appointment is 02/12/16.   States he does not have next appointment with primary MD until 03/07/16 and has not seen since discharge from hospital.   [facility] educated patient on the importance of hospital follow up with primary MD within 1 -2 weeks of hospital discharge.   Patient voices understanding and states he will call primary MD's office for a sooner appointment.    Patient  states his wife no longer works for [employer] and they elected [employer].   States they do have the hospital indemnity supplemental insurance, provided with contact number for [employer] [phone]), and will follow up to file a claim.  [facility].   Patient states he does not have any transition of care, care coordination, disease management, disease monitoring, transportation, community resource, or pharmacy needs at this time.   States he is very Adult nurseappreciative of the follow up call, would not have remembered about supplemental insurance, received a lot of great information from [facility], and apologized for being so distrusting of RNCM at the beginning of conversation.   RNCM advised patient RNCM understanding of hesitancy, is very glad able to build rapport, and patient found  information helpful.   Patient in agreement to receive [facility] Care Management information.   Objective: Per chart review: Patient hospitalized 12/17/15 - 12/21/15 for incisional hernia.   Status post HERNIA REPAIR INCISIONAL and INSERTION OF MESH on 12/17/15.      Assessment:  Received UMR Transition of care referral 12/21/15.   Transition of care follow up completed, no care management needs, and will proceed with case closure.     Plan: RNCM will send patient successful outreach letter, [facility] pamphlet, and magnet. RNCM will send case closure due to follow up completed / no care management needs request to Iverson AlaminLaura Greeson at [facility].   Elynn Patteson H. Gardiner Barefootooper RN, BSN, CCM [facility] Care Management [facility] Telephonic CM Phone: [phone] Fax: [phone]

## 2016-01-31 MED FILL — OMEPRAZOLE DR 20 MG CAPSULE: 20 | 30 days supply | Qty: 30 | Fill #1 | Status: TO

## 2016-02-11 MED FILL — SIMVASTATIN 20 MG TABLET: 20 | 30 days supply | Qty: 30 | Fill #2

## 2016-02-11 MED FILL — clonazePAM 0.5 MG TABS: 0.5 | 30 days supply | Qty: 30 | Fill #2

## 2016-03-06 MED FILL — SIMVASTATIN 20 MG TABLET: 20 | 30 days supply | Qty: 30 | Fill #3

## 2016-03-07 DIAGNOSIS — G2581 Restless legs syndrome: Secondary | ICD-10-CM | POA: Diagnosis not present

## 2016-03-07 DIAGNOSIS — E7800 Pure hypercholesterolemia, unspecified: Secondary | ICD-10-CM | POA: Diagnosis not present

## 2016-03-07 DIAGNOSIS — I10 Essential (primary) hypertension: Secondary | ICD-10-CM | POA: Diagnosis not present

## 2016-03-07 DIAGNOSIS — K219 Gastro-esophageal reflux disease without esophagitis: Secondary | ICD-10-CM | POA: Diagnosis not present

## 2016-03-07 DIAGNOSIS — Z0000 Encounter for general adult medical examination without abnormal findings: Secondary | ICD-10-CM | POA: Diagnosis not present

## 2016-03-07 DIAGNOSIS — Z125 Encounter for screening for malignant neoplasm of prostate: Secondary | ICD-10-CM | POA: Diagnosis not present

## 2016-03-10 MED FILL — clonazePAM 0.5 MG TABS: 0.5 | 30 days supply | Qty: 30 | Fill #3

## 2016-03-20 MED FILL — HYDROCHLOROTHIAZIDE 25 MG T: 25 | 90 days supply | Qty: 90 | Fill #0

## 2016-03-20 MED FILL — OMEPRAZOLE DR 20 MG CAPSULE: 20 | 30 days supply | Qty: 30 | Fill #2 | Status: TO

## 2016-03-24 DIAGNOSIS — H40013 Open angle with borderline findings, low risk, bilateral: Secondary | ICD-10-CM | POA: Diagnosis not present

## 2016-03-26 ENCOUNTER — Ambulatory Visit (INDEPENDENT_AMBULATORY_CARE_PROVIDER_SITE_OTHER): Payer: 59 | Admitting: Ophthalmology

## 2016-03-31 ENCOUNTER — Ambulatory Visit (INDEPENDENT_AMBULATORY_CARE_PROVIDER_SITE_OTHER): Payer: 59 | Admitting: Ophthalmology

## 2016-03-31 DIAGNOSIS — I10 Essential (primary) hypertension: Secondary | ICD-10-CM

## 2016-03-31 DIAGNOSIS — H2513 Age-related nuclear cataract, bilateral: Secondary | ICD-10-CM | POA: Diagnosis not present

## 2016-03-31 DIAGNOSIS — H35033 Hypertensive retinopathy, bilateral: Secondary | ICD-10-CM

## 2016-03-31 DIAGNOSIS — H43812 Vitreous degeneration, left eye: Secondary | ICD-10-CM | POA: Diagnosis not present

## 2016-04-10 MED FILL — SIMVASTATIN 20 MG TABLET: 20 | 30 days supply | Qty: 30 | Fill #0 | Status: TO

## 2016-04-10 MED FILL — clonazePAM 0.5 MG TABS: 0.5 | 30 days supply | Qty: 30 | Fill #0 | Status: TO

## 2016-09-30 DIAGNOSIS — H25011 Cortical age-related cataract, right eye: Secondary | ICD-10-CM | POA: Diagnosis not present

## 2016-10-07 DIAGNOSIS — H25012 Cortical age-related cataract, left eye: Secondary | ICD-10-CM | POA: Diagnosis not present

## 2017-04-02 ENCOUNTER — Ambulatory Visit (INDEPENDENT_AMBULATORY_CARE_PROVIDER_SITE_OTHER): Payer: Self-pay | Admitting: Ophthalmology

## 2019-05-24 ENCOUNTER — Inpatient Hospital Stay (HOSPITAL_COMMUNITY): Payer: PRIVATE HEALTH INSURANCE

## 2019-05-24 ENCOUNTER — Inpatient Hospital Stay (HOSPITAL_COMMUNITY)
Admission: EM | Admit: 2019-05-24 | Discharge: 2019-07-05 | DRG: 963 | Disposition: E | Payer: PRIVATE HEALTH INSURANCE | Attending: General Surgery | Admitting: General Surgery

## 2019-05-24 ENCOUNTER — Emergency Department (HOSPITAL_COMMUNITY): Payer: PRIVATE HEALTH INSURANCE

## 2019-05-24 DIAGNOSIS — I10 Essential (primary) hypertension: Secondary | ICD-10-CM | POA: Diagnosis present

## 2019-05-24 DIAGNOSIS — Z01818 Encounter for other preprocedural examination: Secondary | ICD-10-CM

## 2019-05-24 DIAGNOSIS — I959 Hypotension, unspecified: Secondary | ICD-10-CM

## 2019-05-24 DIAGNOSIS — R062 Wheezing: Secondary | ICD-10-CM

## 2019-05-24 DIAGNOSIS — I609 Nontraumatic subarachnoid hemorrhage, unspecified: Secondary | ICD-10-CM

## 2019-05-24 DIAGNOSIS — D62 Acute posthemorrhagic anemia: Secondary | ICD-10-CM | POA: Diagnosis present

## 2019-05-24 DIAGNOSIS — R0603 Acute respiratory distress: Secondary | ICD-10-CM | POA: Diagnosis not present

## 2019-05-24 DIAGNOSIS — T07XXXA Unspecified multiple injuries, initial encounter: Secondary | ICD-10-CM | POA: Diagnosis present

## 2019-05-24 DIAGNOSIS — Z7189 Other specified counseling: Secondary | ICD-10-CM

## 2019-05-24 DIAGNOSIS — S42102A Fracture of unspecified part of scapula, left shoulder, initial encounter for closed fracture: Secondary | ICD-10-CM

## 2019-05-24 DIAGNOSIS — R000 Tachycardia, unspecified: Secondary | ICD-10-CM | POA: Diagnosis present

## 2019-05-24 DIAGNOSIS — R140 Abdominal distension (gaseous): Secondary | ICD-10-CM | POA: Diagnosis present

## 2019-05-24 DIAGNOSIS — Z66 Do not resuscitate: Secondary | ICD-10-CM

## 2019-05-24 DIAGNOSIS — T17908A Unspecified foreign body in respiratory tract, part unspecified causing other injury, initial encounter: Secondary | ICD-10-CM | POA: Diagnosis not present

## 2019-05-24 DIAGNOSIS — Z515 Encounter for palliative care: Secondary | ICD-10-CM

## 2019-05-24 DIAGNOSIS — L8990 Pressure ulcer of unspecified site, unspecified stage: Secondary | ICD-10-CM | POA: Insufficient documentation

## 2019-05-24 DIAGNOSIS — S2242XA Multiple fractures of ribs, left side, initial encounter for closed fracture: Secondary | ICD-10-CM

## 2019-05-24 DIAGNOSIS — S069X9A Unspecified intracranial injury with loss of consciousness of unspecified duration, initial encounter: Secondary | ICD-10-CM | POA: Diagnosis present

## 2019-05-24 DIAGNOSIS — G2581 Restless legs syndrome: Secondary | ICD-10-CM | POA: Diagnosis present

## 2019-05-24 DIAGNOSIS — G4733 Obstructive sleep apnea (adult) (pediatric): Secondary | ICD-10-CM | POA: Diagnosis present

## 2019-05-24 DIAGNOSIS — Z9911 Dependence on respirator [ventilator] status: Secondary | ICD-10-CM

## 2019-05-24 DIAGNOSIS — S42112A Displaced fracture of body of scapula, left shoulder, initial encounter for closed fracture: Secondary | ICD-10-CM

## 2019-05-24 DIAGNOSIS — J9690 Respiratory failure, unspecified, unspecified whether with hypoxia or hypercapnia: Secondary | ICD-10-CM

## 2019-05-24 DIAGNOSIS — Z0189 Encounter for other specified special examinations: Secondary | ICD-10-CM

## 2019-05-24 DIAGNOSIS — J690 Pneumonitis due to inhalation of food and vomit: Secondary | ICD-10-CM | POA: Diagnosis not present

## 2019-05-24 DIAGNOSIS — R52 Pain, unspecified: Secondary | ICD-10-CM

## 2019-05-24 DIAGNOSIS — R0902 Hypoxemia: Secondary | ICD-10-CM

## 2019-05-24 DIAGNOSIS — N179 Acute kidney failure, unspecified: Secondary | ICD-10-CM | POA: Diagnosis not present

## 2019-05-24 DIAGNOSIS — Z20822 Contact with and (suspected) exposure to covid-19: Secondary | ICD-10-CM | POA: Diagnosis present

## 2019-05-24 DIAGNOSIS — S22079A Unspecified fracture of T9-T10 vertebra, initial encounter for closed fracture: Secondary | ICD-10-CM | POA: Diagnosis present

## 2019-05-24 DIAGNOSIS — K5792 Diverticulitis of intestine, part unspecified, without perforation or abscess without bleeding: Secondary | ICD-10-CM | POA: Diagnosis present

## 2019-05-24 DIAGNOSIS — A4101 Sepsis due to Methicillin susceptible Staphylococcus aureus: Secondary | ICD-10-CM | POA: Diagnosis not present

## 2019-05-24 DIAGNOSIS — S271XXA Traumatic hemothorax, initial encounter: Secondary | ICD-10-CM | POA: Diagnosis present

## 2019-05-24 DIAGNOSIS — J155 Pneumonia due to Escherichia coli: Secondary | ICD-10-CM | POA: Diagnosis not present

## 2019-05-24 DIAGNOSIS — N170 Acute kidney failure with tubular necrosis: Secondary | ICD-10-CM | POA: Diagnosis present

## 2019-05-24 DIAGNOSIS — R4701 Aphasia: Secondary | ICD-10-CM | POA: Diagnosis not present

## 2019-05-24 DIAGNOSIS — K567 Ileus, unspecified: Secondary | ICD-10-CM | POA: Diagnosis not present

## 2019-05-24 DIAGNOSIS — J80 Acute respiratory distress syndrome: Secondary | ICD-10-CM | POA: Diagnosis not present

## 2019-05-24 DIAGNOSIS — R7881 Bacteremia: Secondary | ICD-10-CM | POA: Diagnosis not present

## 2019-05-24 DIAGNOSIS — S80211A Abrasion, right knee, initial encounter: Secondary | ICD-10-CM | POA: Diagnosis present

## 2019-05-24 DIAGNOSIS — S22059A Unspecified fracture of T5-T6 vertebra, initial encounter for closed fracture: Secondary | ICD-10-CM | POA: Diagnosis present

## 2019-05-24 DIAGNOSIS — G9341 Metabolic encephalopathy: Secondary | ICD-10-CM | POA: Diagnosis not present

## 2019-05-24 DIAGNOSIS — S22069A Unspecified fracture of T7-T8 vertebra, initial encounter for closed fracture: Secondary | ICD-10-CM | POA: Diagnosis present

## 2019-05-24 DIAGNOSIS — S27321A Contusion of lung, unilateral, initial encounter: Secondary | ICD-10-CM | POA: Diagnosis present

## 2019-05-24 DIAGNOSIS — R402142 Coma scale, eyes open, spontaneous, at arrival to emergency department: Secondary | ICD-10-CM | POA: Diagnosis present

## 2019-05-24 DIAGNOSIS — S20311A Abrasion of right front wall of thorax, initial encounter: Secondary | ICD-10-CM | POA: Diagnosis present

## 2019-05-24 DIAGNOSIS — R9431 Abnormal electrocardiogram [ECG] [EKG]: Secondary | ICD-10-CM | POA: Diagnosis not present

## 2019-05-24 DIAGNOSIS — E279 Disorder of adrenal gland, unspecified: Secondary | ICD-10-CM | POA: Diagnosis present

## 2019-05-24 DIAGNOSIS — S80212A Abrasion, left knee, initial encounter: Secondary | ICD-10-CM | POA: Diagnosis present

## 2019-05-24 DIAGNOSIS — E7800 Pure hypercholesterolemia, unspecified: Secondary | ICD-10-CM | POA: Diagnosis present

## 2019-05-24 DIAGNOSIS — B9561 Methicillin susceptible Staphylococcus aureus infection as the cause of diseases classified elsewhere: Secondary | ICD-10-CM | POA: Diagnosis not present

## 2019-05-24 DIAGNOSIS — R6521 Severe sepsis with septic shock: Secondary | ICD-10-CM | POA: Diagnosis not present

## 2019-05-24 DIAGNOSIS — S36030A Superficial (capsular) laceration of spleen, initial encounter: Secondary | ICD-10-CM | POA: Diagnosis present

## 2019-05-24 DIAGNOSIS — E46 Unspecified protein-calorie malnutrition: Secondary | ICD-10-CM | POA: Diagnosis present

## 2019-05-24 DIAGNOSIS — R410 Disorientation, unspecified: Secondary | ICD-10-CM | POA: Diagnosis not present

## 2019-05-24 DIAGNOSIS — Z1611 Resistance to penicillins: Secondary | ICD-10-CM | POA: Diagnosis present

## 2019-05-24 DIAGNOSIS — M898X1 Other specified disorders of bone, shoulder: Secondary | ICD-10-CM | POA: Diagnosis not present

## 2019-05-24 DIAGNOSIS — E870 Hyperosmolality and hypernatremia: Secondary | ICD-10-CM | POA: Diagnosis not present

## 2019-05-24 DIAGNOSIS — S066X9A Traumatic subarachnoid hemorrhage with loss of consciousness of unspecified duration, initial encounter: Principal | ICD-10-CM | POA: Diagnosis present

## 2019-05-24 DIAGNOSIS — R079 Chest pain, unspecified: Secondary | ICD-10-CM | POA: Diagnosis not present

## 2019-05-24 DIAGNOSIS — J156 Pneumonia due to other aerobic Gram-negative bacteria: Secondary | ICD-10-CM | POA: Diagnosis not present

## 2019-05-24 DIAGNOSIS — S2239XA Fracture of one rib, unspecified side, initial encounter for closed fracture: Secondary | ICD-10-CM | POA: Diagnosis not present

## 2019-05-24 DIAGNOSIS — S069X9D Unspecified intracranial injury with loss of consciousness of unspecified duration, subsequent encounter: Secondary | ICD-10-CM | POA: Diagnosis not present

## 2019-05-24 DIAGNOSIS — E872 Acidosis: Secondary | ICD-10-CM | POA: Diagnosis present

## 2019-05-24 DIAGNOSIS — S062X0A Diffuse traumatic brain injury without loss of consciousness, initial encounter: Secondary | ICD-10-CM | POA: Diagnosis present

## 2019-05-24 DIAGNOSIS — J9601 Acute respiratory failure with hypoxia: Secondary | ICD-10-CM | POA: Diagnosis not present

## 2019-05-24 DIAGNOSIS — A4901 Methicillin susceptible Staphylococcus aureus infection, unspecified site: Secondary | ICD-10-CM | POA: Diagnosis not present

## 2019-05-24 DIAGNOSIS — R739 Hyperglycemia, unspecified: Secondary | ICD-10-CM | POA: Diagnosis not present

## 2019-05-24 DIAGNOSIS — R402362 Coma scale, best motor response, obeys commands, at arrival to emergency department: Secondary | ICD-10-CM | POA: Diagnosis present

## 2019-05-24 DIAGNOSIS — R402252 Coma scale, best verbal response, oriented, at arrival to emergency department: Secondary | ICD-10-CM | POA: Diagnosis present

## 2019-05-24 DIAGNOSIS — S50312A Abrasion of left elbow, initial encounter: Secondary | ICD-10-CM | POA: Diagnosis present

## 2019-05-24 DIAGNOSIS — S42152P Displaced fracture of neck of scapula, left shoulder, subsequent encounter for fracture with malunion: Secondary | ICD-10-CM | POA: Diagnosis not present

## 2019-05-24 DIAGNOSIS — T39395A Adverse effect of other nonsteroidal anti-inflammatory drugs [NSAID], initial encounter: Secondary | ICD-10-CM | POA: Diagnosis present

## 2019-05-24 DIAGNOSIS — A419 Sepsis, unspecified organism: Secondary | ICD-10-CM | POA: Diagnosis not present

## 2019-05-24 DIAGNOSIS — T17908D Unspecified foreign body in respiratory tract, part unspecified causing other injury, subsequent encounter: Secondary | ICD-10-CM | POA: Diagnosis not present

## 2019-05-24 DIAGNOSIS — R570 Cardiogenic shock: Secondary | ICD-10-CM | POA: Diagnosis not present

## 2019-05-24 DIAGNOSIS — R34 Anuria and oliguria: Secondary | ICD-10-CM | POA: Diagnosis not present

## 2019-05-24 DIAGNOSIS — Z6829 Body mass index (BMI) 29.0-29.9, adult: Secondary | ICD-10-CM

## 2019-05-24 DIAGNOSIS — S50311A Abrasion of right elbow, initial encounter: Secondary | ICD-10-CM | POA: Diagnosis present

## 2019-05-24 DIAGNOSIS — J90 Pleural effusion, not elsewhere classified: Secondary | ICD-10-CM | POA: Diagnosis not present

## 2019-05-24 DIAGNOSIS — I248 Other forms of acute ischemic heart disease: Secondary | ICD-10-CM | POA: Diagnosis not present

## 2019-05-24 DIAGNOSIS — S30811A Abrasion of abdominal wall, initial encounter: Secondary | ICD-10-CM | POA: Diagnosis present

## 2019-05-24 DIAGNOSIS — I351 Nonrheumatic aortic (valve) insufficiency: Secondary | ICD-10-CM | POA: Diagnosis present

## 2019-05-24 LAB — CBC
HCT: 47.9 % (ref 39.0–52.0)
HCT: 38.6 % — ABNORMAL LOW (ref 39.0–52.0)
Hemoglobin: 15.9 g/dL (ref 13.0–17.0)
Hemoglobin: 13.1 g/dL (ref 13.0–17.0)
MCH: 28.2 pg (ref 26.0–34.0)
MCH: 29 pg (ref 26.0–34.0)
MCHC: 33.2 g/dL (ref 30.0–36.0)
MCHC: 33.9 g/dL (ref 30.0–36.0)
MCV: 85.1 fL (ref 80.0–100.0)
MCV: 85.4 fL (ref 80.0–100.0)
Platelets: 307 10*3/uL (ref 150–400)
Platelets: 272 10*3/uL (ref 150–400)
RBC: 5.63 MIL/uL (ref 4.22–5.81)
RBC: 4.52 MIL/uL (ref 4.22–5.81)
RDW: 13.8 % (ref 11.5–15.5)
RDW: 14 % (ref 11.5–15.5)
WBC: 18.3 10*3/uL — ABNORMAL HIGH (ref 4.0–10.5)
WBC: 27.8 10*3/uL — ABNORMAL HIGH (ref 4.0–10.5)
nRBC: 0 % (ref 0.0–0.2)
nRBC: 0 % (ref 0.0–0.2)

## 2019-05-24 LAB — POCT I-STAT 7, (LYTES, BLD GAS, ICA,H+H)
Acid-base deficit: 5 mmol/L — ABNORMAL HIGH (ref 0.0–2.0)
Bicarbonate: 19.3 mmol/L — ABNORMAL LOW (ref 20.0–28.0)
Calcium, Ion: 1.11 mmol/L — ABNORMAL LOW (ref 1.15–1.40)
HCT: 36 % — ABNORMAL LOW (ref 39.0–52.0)
Hemoglobin: 12.2 g/dL — ABNORMAL LOW (ref 13.0–17.0)
O2 Saturation: 93 %
Patient temperature: 98.6
Potassium: 3.3 mmol/L — ABNORMAL LOW (ref 3.5–5.1)
Sodium: 140 mmol/L (ref 135–145)
TCO2: 20 mmol/L — ABNORMAL LOW (ref 22–32)
pCO2 arterial: 31.3 mmHg — ABNORMAL LOW (ref 32.0–48.0)
pH, Arterial: 7.399 (ref 7.350–7.450)
pO2, Arterial: 66 mmHg — ABNORMAL LOW (ref 83.0–108.0)

## 2019-05-24 LAB — I-STAT CHEM 8, ED
BUN: 20 mg/dL (ref 8–23)
Calcium, Ion: 1.15 mmol/L (ref 1.15–1.40)
Chloride: 101 mmol/L (ref 98–111)
Creatinine, Ser: 1.2 mg/dL (ref 0.61–1.24)
Glucose, Bld: 139 mg/dL — ABNORMAL HIGH (ref 70–99)
HCT: 46 % (ref 39.0–52.0)
Hemoglobin: 15.6 g/dL (ref 13.0–17.0)
Potassium: 3.6 mmol/L (ref 3.5–5.1)
Sodium: 140 mmol/L (ref 135–145)
TCO2: 30 mmol/L (ref 22–32)

## 2019-05-24 LAB — COMPREHENSIVE METABOLIC PANEL
ALT: 177 U/L — ABNORMAL HIGH (ref 0–44)
AST: 191 U/L — ABNORMAL HIGH (ref 15–41)
Albumin: 3.6 g/dL (ref 3.5–5.0)
Alkaline Phosphatase: 71 U/L (ref 38–126)
Anion gap: 11 (ref 5–15)
BUN: 16 mg/dL (ref 8–23)
CO2: 26 mmol/L (ref 22–32)
Calcium: 9 mg/dL (ref 8.9–10.3)
Chloride: 102 mmol/L (ref 98–111)
Creatinine, Ser: 1.29 mg/dL — ABNORMAL HIGH (ref 0.61–1.24)
GFR calc Af Amer: 38 mL/min — ABNORMAL LOW (ref >60)
GFR calc non Af Amer: 33 mL/min — ABNORMAL LOW (ref >60)
Glucose, Bld: 146 mg/dL — ABNORMAL HIGH (ref 70–99)
Potassium: 3.5 mmol/L (ref 3.5–5.1)
Sodium: 139 mmol/L (ref 135–145)
Total Bilirubin: 1.1 mg/dL (ref 0.3–1.2)
Total Protein: 6.9 g/dL (ref 6.5–8.1)

## 2019-05-24 LAB — URINALYSIS, ROUTINE W REFLEX MICROSCOPIC
Bacteria, UA: NONE SEEN
Bilirubin Urine: NEGATIVE
Glucose, UA: NEGATIVE mg/dL
Ketones, ur: NEGATIVE mg/dL
Leukocytes,Ua: NEGATIVE
Nitrite: NEGATIVE
Protein, ur: 100 mg/dL — AB
RBC / HPF: >50 RBC/hpf — ABNORMAL HIGH (ref 0–5)
Specific Gravity, Urine: >1.046 — ABNORMAL HIGH (ref 1.005–1.030)
WBC, UA: >50 WBC/hpf — ABNORMAL HIGH (ref 0–5)
pH: 6 (ref 5.0–8.0)

## 2019-05-24 LAB — RESPIRATORY PANEL BY RT PCR (FLU A&B, COVID)
Influenza A by PCR: NEGATIVE
Influenza B by PCR: NEGATIVE
SARS Coronavirus 2 by RT PCR: NEGATIVE

## 2019-05-24 LAB — ETHANOL: Alcohol, Ethyl (B): <10 mg/dL (ref <10)

## 2019-05-24 LAB — SAMPLE TO BLOOD BANK

## 2019-05-24 LAB — MRSA PCR SCREENING: MRSA by PCR: NEGATIVE

## 2019-05-24 LAB — LACTIC ACID, PLASMA: Lactic Acid, Venous: 2.9 mmol/L (ref 0.5–1.9)

## 2019-05-24 LAB — PROTIME-INR
INR: 1 (ref 0.8–1.2)
Prothrombin Time: 13.3 seconds (ref 11.4–15.2)

## 2019-05-24 MED ORDER — CHLORHEXIDINE GLUCONATE CLOTH 2 % EX PADS
6.0000 | MEDICATED_PAD | Freq: Every day | CUTANEOUS | Status: DC
  Administered 2019-05-24 – 2019-06-16 (×23): 6 via TOPICAL

## 2019-05-24 MED ORDER — POTASSIUM CHLORIDE 10 MEQ/100ML IV SOLN
10.0000 meq | INTRAVENOUS | Status: AC
  Administered 2019-05-24 – 2019-05-25 (×4): 10 meq via INTRAVENOUS
  Filled 2019-05-24 (×4): qty 100

## 2019-05-24 MED ORDER — HYDROMORPHONE HCL 1 MG/ML IJ SOLN: 1.0000 mg | INTRAMUSCULAR | Status: DC

## 2019-05-24 MED ORDER — METHOCARBAMOL 1000 MG/10ML IJ SOLN
500.0000 mg | Freq: Three times a day (TID) | INTRAVENOUS | Status: DC
  Administered 2019-05-24 – 2019-05-25 (×2): 500 mg via INTRAVENOUS
  Filled 2019-05-24 (×4): qty 5

## 2019-05-24 MED ORDER — METOPROLOL TARTRATE 5 MG/5ML IV SOLN: 5.0000 mg | INTRAVENOUS | Status: DC

## 2019-05-24 MED ORDER — HYDROMORPHONE HCL 1 MG/ML IJ SOLN
1.0000 mg | INTRAMUSCULAR | Status: DC | PRN
  Administered 2019-05-24 – 2019-05-25 (×3): 1 mg via INTRAVENOUS
  Filled 2019-05-24 (×4): qty 1

## 2019-05-24 MED ORDER — MORPHINE SULFATE (PF) 2 MG/ML IV SOLN
2.0000 mg | INTRAVENOUS | Status: DC | PRN
  Administered 2019-05-24: 4 mg via INTRAVENOUS
  Filled 2019-05-24: qty 2

## 2019-05-24 MED ORDER — SODIUM CHLORIDE 0.9 % IV SOLN
INTRAVENOUS | Status: AC | PRN
  Administered 2019-05-24: 1000 mL via INTRAVENOUS

## 2019-05-24 MED ORDER — ONDANSETRON HCL 4 MG/2ML IJ SOLN
4.0000 mg | Freq: Four times a day (QID) | INTRAMUSCULAR | Status: DC | PRN
  Filled 2019-05-24: qty 2

## 2019-05-24 MED ORDER — IOHEXOL 300 MG/ML  SOLN
100.0000 mL | Freq: Once | INTRAMUSCULAR | Status: AC | PRN
  Administered 2019-05-24: 100 mL via INTRAVENOUS

## 2019-05-24 MED ORDER — ACETAMINOPHEN 10 MG/ML IV SOLN
1000.0000 mg | Freq: Four times a day (QID) | INTRAVENOUS | Status: DC
  Administered 2019-05-24 – 2019-05-25 (×3): 1000 mg via INTRAVENOUS
  Filled 2019-05-24 (×6): qty 100

## 2019-05-24 MED ORDER — SODIUM CHLORIDE 0.9 % IV SOLN: INTRAVENOUS | Status: DC

## 2019-05-24 MED ORDER — FENTANYL CITRATE (PF) 100 MCG/2ML IJ SOLN
50.0000 ug | Freq: Once | INTRAMUSCULAR | Status: AC
  Administered 2019-05-24: 50 ug via INTRAVENOUS
  Filled 2019-05-24: qty 2

## 2019-05-24 MED ORDER — LABETALOL HCL 5 MG/ML IV SOLN: 10.0000 mg | INTRAVENOUS | Status: DC | PRN

## 2019-05-24 MED ORDER — SODIUM CHLORIDE 0.9 % IV SOLN
INTRAVENOUS | Status: AC | PRN
  Administered 2019-05-24: 2000 mL/h via INTRAVENOUS

## 2019-05-24 MED ORDER — METOPROLOL TARTRATE 5 MG/5ML IV SOLN
5.0000 mg | INTRAVENOUS | Status: DC | PRN
  Administered 2019-05-24 – 2019-05-29 (×3): 5 mg via INTRAVENOUS
  Filled 2019-05-24 (×3): qty 5

## 2019-05-24 MED ORDER — ONDANSETRON 4 MG PO TBDP: 4.0000 mg | ORAL_TABLET | Freq: Four times a day (QID) | ORAL | Status: DC | PRN

## 2019-05-24 MED ORDER — FAMOTIDINE IN NACL 20-0.9 MG/50ML-% IV SOLN
20.0000 mg | Freq: Two times a day (BID) | INTRAVENOUS | Status: DC
  Administered 2019-05-24: 20 mg via INTRAVENOUS
  Filled 2019-05-24: qty 50
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
## 2019-05-24 NOTE — ED Notes (Signed)
Pt transported to CT with primary RN 

## 2019-05-24 NOTE — ED Notes (Signed)
Dr. Dwain Sarna to CT scanner to evaluate pt

## 2019-05-24 NOTE — ED Notes (Signed)
Dr Maurice Small with Neurosurgery at the bedside

## 2019-05-24 NOTE — ED Notes (Addendum)
Pressures in low 80's, [place] paged and provider notified. Provider at bedside. Hung 1L fluid per verbal order.
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
## 2019-05-24 NOTE — ED Notes (Signed)
Pt's neck collar replace w/ the assistance of the surgeon. Pt also put into arm sling.
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
## 2019-05-24 NOTE — ED Notes (Signed)
Dr. Maurice Small paged to [phone]-per Ander Slade, PA paged by Marylene Land

## 2019-05-24 NOTE — ED Notes (Signed)
Dr. Aundria Rud paged to [phone]-per Ander Slade, PA paged by Marylene Land
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
## 2019-05-24 NOTE — ED Notes (Signed)
Dr. Dwain Sarna paged to [phone] Evangeline Gula paged by Marylene Land

## 2019-05-25 ENCOUNTER — Inpatient Hospital Stay (HOSPITAL_COMMUNITY): Payer: PRIVATE HEALTH INSURANCE

## 2019-05-25 ENCOUNTER — Other Ambulatory Visit: Payer: Self-pay

## 2019-05-25 LAB — CBC
HCT: 37.5 % — ABNORMAL LOW (ref 39.0–52.0)
HCT: 36.8 % — ABNORMAL LOW (ref 39.0–52.0)
HCT: 32.7 % — ABNORMAL LOW (ref 39.0–52.0)
HCT: 31.5 % — ABNORMAL LOW (ref 39.0–52.0)
Hemoglobin: 12.8 g/dL — ABNORMAL LOW (ref 13.0–17.0)
Hemoglobin: 12.3 g/dL — ABNORMAL LOW (ref 13.0–17.0)
Hemoglobin: 11.1 g/dL — ABNORMAL LOW (ref 13.0–17.0)
Hemoglobin: 10.7 g/dL — ABNORMAL LOW (ref 13.0–17.0)
MCH: 28.8 pg (ref 26.0–34.0)
MCH: 29.3 pg (ref 26.0–34.0)
MCH: 28.9 pg (ref 26.0–34.0)
MCH: 29.2 pg (ref 26.0–34.0)
MCHC: 34.1 g/dL (ref 30.0–36.0)
MCHC: 33.4 g/dL (ref 30.0–36.0)
MCHC: 33.9 g/dL (ref 30.0–36.0)
MCHC: 34 g/dL (ref 30.0–36.0)
MCV: 84.5 fL (ref 80.0–100.0)
MCV: 87.6 fL (ref 80.0–100.0)
MCV: 85.2 fL (ref 80.0–100.0)
MCV: 86.1 fL (ref 80.0–100.0)
Platelets: 235 10*3/uL (ref 150–400)
Platelets: 227 10*3/uL (ref 150–400)
Platelets: 196 10*3/uL (ref 150–400)
Platelets: 185 10*3/uL (ref 150–400)
RBC: 4.44 MIL/uL (ref 4.22–5.81)
RBC: 4.2 MIL/uL — ABNORMAL LOW (ref 4.22–5.81)
RBC: 3.84 MIL/uL — ABNORMAL LOW (ref 4.22–5.81)
RBC: 3.66 MIL/uL — ABNORMAL LOW (ref 4.22–5.81)
RDW: 14.1 % (ref 11.5–15.5)
RDW: 14.4 % (ref 11.5–15.5)
RDW: 14.3 % (ref 11.5–15.5)
RDW: 14.3 % (ref 11.5–15.5)
WBC: 20.9 10*3/uL — ABNORMAL HIGH (ref 4.0–10.5)
WBC: 16.5 10*3/uL — ABNORMAL HIGH (ref 4.0–10.5)
WBC: 14 10*3/uL — ABNORMAL HIGH (ref 4.0–10.5)
WBC: 12.8 10*3/uL — ABNORMAL HIGH (ref 4.0–10.5)
nRBC: 0 % (ref 0.0–0.2)
nRBC: 0 % (ref 0.0–0.2)
nRBC: 0 % (ref 0.0–0.2)
nRBC: 0 % (ref 0.0–0.2)

## 2019-05-25 LAB — LACTIC ACID, PLASMA: Lactic Acid, Venous: 4.7 mmol/L (ref 0.5–1.9)

## 2019-05-25 LAB — BASIC METABOLIC PANEL
Anion gap: 12 (ref 5–15)
BUN: 22 mg/dL (ref 8–23)
CO2: 19 mmol/L — ABNORMAL LOW (ref 22–32)
Calcium: 8 mg/dL — ABNORMAL LOW (ref 8.9–10.3)
Chloride: 110 mmol/L (ref 98–111)
Creatinine, Ser: 1.27 mg/dL — ABNORMAL HIGH (ref 0.61–1.24)
GFR calc Af Amer: >60 mL/min (ref >60)
GFR calc non Af Amer: 59 mL/min — ABNORMAL LOW (ref >60)
Glucose, Bld: 187 mg/dL — ABNORMAL HIGH (ref 70–99)
Potassium: 4.4 mmol/L (ref 3.5–5.1)
Sodium: 141 mmol/L (ref 135–145)

## 2019-05-25 MED ORDER — ACETAMINOPHEN 500 MG PO TABS
1000.0000 mg | ORAL_TABLET | Freq: Four times a day (QID) | ORAL | Status: DC
  Administered 2019-05-25 – 2019-05-29 (×16): 1000 mg via ORAL
  Filled 2019-05-25 (×18): qty 2

## 2019-05-25 MED ORDER — PANTOPRAZOLE SODIUM 40 MG IV SOLR
40.0000 mg | INTRAVENOUS | Status: DC
  Administered 2019-05-25 – 2019-05-26 (×2): 40 mg via INTRAVENOUS
  Filled 2019-05-25 (×2): qty 40

## 2019-05-25 MED ORDER — OXYCODONE HCL 5 MG/5ML PO SOLN
5.0000 mg | ORAL | Status: DC | PRN
  Administered 2019-05-25: 10 mg via ORAL
  Filled 2019-05-25: qty 10

## 2019-05-25 MED ORDER — LEVETIRACETAM IN NACL 500 MG/100ML IV SOLN
500.0000 mg | Freq: Two times a day (BID) | INTRAVENOUS | Status: DC
  Administered 2019-05-25 – 2019-05-26 (×4): 500 mg via INTRAVENOUS
  Filled 2019-05-25 (×4): qty 100

## 2019-05-25 MED ORDER — LACTATED RINGERS IV BOLUS
1000.0000 mL | Freq: Once | INTRAVENOUS | Status: AC
  Administered 2019-05-25: 1000 mL via INTRAVENOUS

## 2019-05-25 MED ORDER — OXYCODONE HCL 5 MG PO TABS
5.0000 mg | ORAL_TABLET | ORAL | Status: DC | PRN
  Administered 2019-05-26: 10 mg via ORAL
  Administered 2019-05-26: 5 mg via ORAL
  Administered 2019-05-26: 10 mg via ORAL
  Administered 2019-05-26: 5 mg via ORAL
  Administered 2019-05-26 – 2019-05-29 (×8): 10 mg via ORAL
  Filled 2019-05-25: qty 1
  Filled 2019-05-25 (×4): qty 2
  Filled 2019-05-25: qty 1
  Filled 2019-05-25 (×7): qty 2

## 2019-05-25 MED ORDER — METHOCARBAMOL 500 MG PO TABS
1000.0000 mg | ORAL_TABLET | Freq: Three times a day (TID) | ORAL | Status: DC
  Administered 2019-05-25 – 2019-05-29 (×13): 1000 mg via ORAL
  Filled 2019-05-25 (×14): qty 2

## 2019-05-25 MED ORDER — ORAL CARE MOUTH RINSE
15.0000 mL | Freq: Two times a day (BID) | OROMUCOSAL | Status: DC
  Administered 2019-05-25 – 2019-05-30 (×9): 15 mL via OROMUCOSAL

## 2019-05-25 MED ORDER — MORPHINE SULFATE (PF) 2 MG/ML IV SOLN
2.0000 mg | INTRAVENOUS | Status: DC | PRN
  Administered 2019-05-25 – 2019-05-30 (×8): 2 mg via INTRAVENOUS
  Administered 2019-05-30: 4 mg via INTRAVENOUS
  Administered 2019-05-30 – 2019-05-31 (×5): 2 mg via INTRAVENOUS
  Filled 2019-05-25 (×4): qty 1
  Filled 2019-05-25: qty 2
  Filled 2019-05-25 (×9): qty 1

## 2019-05-25 NOTE — Progress Notes (Signed)
Trauma/Critical Care Follow Up Note  Subjective:    Overnight Issues:   Objective:  Vital signs for last 24 hours: Temp:  [97.1 F (36.2 C)-100.2 F (37.9 C)] 98.2 F (36.8 C) (04/21 0800) Pulse Rate:  [100-143] 106 (04/21 0900) Resp:  [17-31] 18 (04/21 0800) BP: (82-159)/(43-107) 159/87 (04/21 0900) SpO2:  [92 %-100 %] 97 % (04/21 0900) Weight:  [129.3 kg] 129.3 kg (04/20 1701)  Hemodynamic parameters for last 24 hours:    Intake/Output from previous day: 04/20 0701 - 04/21 0700 In: 4926.4 [I.V.:4100; IV Piggyback:826.4] Out: 575 [Urine:575]  Intake/Output this shift: No intake/output data recorded.  Vent settings for last 24 hours:    Physical Exam:  Gen: comfortable, no distress Neuro: non-focal exam HEENT: PERRL Neck: c-collar in place, removed this AM CV: RRR Pulm: unlabored breathing Abd: soft, NT GU: clear yellow urine Extr: wwp, no edema   Results for orders placed or performed during the hospital encounter of 05/31/2019 (from the past 24 hour(s))  Sample to Blood Bank     Status: None   Collection Time: May 31, 2019  5:06 PM  Result Value Ref Range   Blood Bank Specimen SAMPLE AVAILABLE FOR TESTING    Sample Expiration      05/25/2019,2359 Performed at [facility], [address] [place], [place] [address]   Comprehensive metabolic panel     Status: Abnormal   Collection Time: 05-31-19  5:07 PM  Result Value Ref Range   Sodium 139 135 - 145 mmol/L   Potassium 3.5 3.5 - 5.1 mmol/L   Chloride 102 98 - 111 mmol/L   CO2 26 22 - 32 mmol/L   Glucose, Bld 146 (H) 70 - 99 mg/dL   BUN 16 8 - 23 mg/dL   Creatinine, Ser 6.04 (H) 0.61 - 1.24 mg/dL   Calcium 9.0 8.9 - 54.0 mg/dL   Total Protein 6.9 6.5 - 8.1 g/dL   Albumin 3.6 3.5 - 5.0 g/dL   AST 981 (H) 15 - 41 U/L   ALT 177 (H) 0 - 44 U/L   Alkaline Phosphatase 71 38 - 126 U/L   Total Bilirubin 1.1 0.3 - 1.2 mg/dL   GFR calc non Af Amer 33 (L) >60 mL/min   GFR calc Af Amer 38 (L) >60 mL/min    Anion gap 11 5 - 15  CBC     Status: Abnormal   Collection Time: May 31, 2019  5:07 PM  Result Value Ref Range   WBC 18.3 (H) 4.0 - 10.5 K/uL   RBC 5.63 4.22 - 5.81 MIL/uL   Hemoglobin 15.9 13.0 - 17.0 g/dL   HCT 19.1 47.8 - 29.5 %   MCV 85.1 80.0 - 100.0 fL   MCH 28.2 26.0 - 34.0 pg   MCHC 33.2 30.0 - 36.0 g/dL   RDW 62.1 30.8 - 65.7 %   Platelets 307 150 - 400 K/uL   nRBC 0.0 0.0 - 0.2 %  Ethanol     Status: None   Collection Time: 05/31/2019  5:07 PM  Result Value Ref Range   Alcohol, Ethyl (B) <10 <10 mg/dL  Lactic acid, plasma     Status: Abnormal   Collection Time: May 31, 2019  5:07 PM  Result Value Ref Range   Lactic Acid, Venous 2.9 (HH) 0.5 - 1.9 mmol/L  Protime-INR     Status: None   Collection Time: 2019/05/31  5:07 PM  Result Value Ref Range   Prothrombin Time 13.3 11.4 - 15.2 seconds  INR 1.0 0.8 - 1.2  I-Stat Chem 8, ED     Status: Abnormal   Collection Time: 05-26-2019  5:19 PM  Result Value Ref Range   Sodium 140 135 - 145 mmol/L   Potassium 3.6 3.5 - 5.1 mmol/L   Chloride 101 98 - 111 mmol/L   BUN 20 8 - 23 mg/dL   Creatinine, Ser 4.40 0.61 - 1.24 mg/dL   Glucose, Bld 102 (H) 70 - 99 mg/dL   Calcium, Ion 7.25 3.66 - 1.40 mmol/L   TCO2 30 22 - 32 mmol/L   Hemoglobin 15.6 13.0 - 17.0 g/dL   HCT 44.0 34.7 - 42.5 %  Respiratory Panel by RT PCR (Flu A&B, Covid) - Nasopharyngeal Swab     Status: None   Collection Time: 05-26-2019  5:23 PM   Specimen: Nasopharyngeal Swab  Result Value Ref Range   SARS Coronavirus 2 by RT PCR NEGATIVE NEGATIVE   Influenza A by PCR NEGATIVE NEGATIVE   Influenza B by PCR NEGATIVE NEGATIVE  I-STAT 7, (LYTES, BLD GAS, ICA, H+H)     Status: Abnormal   Collection Time: 05-26-2019  6:50 PM  Result Value Ref Range   pH, Arterial 7.399 7.350 - 7.450   pCO2 arterial 31.3 (L) 32.0 - 48.0 mmHg   pO2, Arterial 66.0 (L) 83.0 - 108.0 mmHg   Bicarbonate 19.3 (L) 20.0 - 28.0 mmol/L   TCO2 20 (L) 22 - 32 mmol/L   O2 Saturation 93.0 %   Acid-base  deficit 5.0 (H) 0.0 - 2.0 mmol/L   Sodium 140 135 - 145 mmol/L   Potassium 3.3 (L) 3.5 - 5.1 mmol/L   Calcium, Ion 1.11 (L) 1.15 - 1.40 mmol/L   HCT 36.0 (L) 39.0 - 52.0 %   Hemoglobin 12.2 (L) 13.0 - 17.0 g/dL   Patient temperature 95.6 F    Sample type ARTERIAL   MRSA PCR Screening     Status: None   Collection Time: 05-26-19  8:16 PM   Specimen: Nasopharyngeal  Result Value Ref Range   MRSA by PCR NEGATIVE NEGATIVE  CBC     Status: Abnormal   Collection Time: 05/26/19  8:24 PM  Result Value Ref Range   WBC 27.8 (H) 4.0 - 10.5 K/uL   RBC 4.52 4.22 - 5.81 MIL/uL   Hemoglobin 13.1 13.0 - 17.0 g/dL   HCT 38.7 (L) 56.4 - 33.2 %   MCV 85.4 80.0 - 100.0 fL   MCH 29.0 26.0 - 34.0 pg   MCHC 33.9 30.0 - 36.0 g/dL   RDW 95.1 88.4 - 16.6 %   Platelets 272 150 - 400 K/uL   nRBC 0.0 0.0 - 0.2 %  Urinalysis, Routine w reflex microscopic     Status: Abnormal   Collection Time: 2019-05-26  8:30 PM  Result Value Ref Range   Color, Urine AMBER (A) YELLOW   APPearance HAZY (A) CLEAR   Specific Gravity, Urine >1.046 (H) 1.005 - 1.030   pH 6.0 5.0 - 8.0   Glucose, UA NEGATIVE NEGATIVE mg/dL   Hgb urine dipstick LARGE (A) NEGATIVE   Bilirubin Urine NEGATIVE NEGATIVE   Ketones, ur NEGATIVE NEGATIVE mg/dL   Protein, ur 063 (A) NEGATIVE mg/dL   Nitrite NEGATIVE NEGATIVE   Leukocytes,Ua NEGATIVE NEGATIVE   RBC / HPF >50 (H) 0 - 5 RBC/hpf   WBC, UA >50 (H) 0 - 5 WBC/hpf   Bacteria, UA NONE SEEN NONE SEEN   WBC Clumps PRESENT    Budding Yeast  PRESENT    Non Squamous Epithelial 0-5 (A) NONE SEEN  Lactic acid, plasma     Status: Abnormal   Collection Time: 05/25/19  1:32 AM  Result Value Ref Range   Lactic Acid, Venous 4.7 (HH) 0.5 - 1.9 mmol/L  CBC     Status: Abnormal   Collection Time: 05/25/19  1:32 AM  Result Value Ref Range   WBC 20.9 (H) 4.0 - 10.5 K/uL   RBC 4.44 4.22 - 5.81 MIL/uL   Hemoglobin 12.8 (L) 13.0 - 17.0 g/dL   HCT 56.2 (L) 13.0 - 86.5 %   MCV 84.5 80.0 - 100.0 fL   MCH  28.8 26.0 - 34.0 pg   MCHC 34.1 30.0 - 36.0 g/dL   RDW 78.4 69.6 - 29.5 %   Platelets 235 150 - 400 K/uL   nRBC 0.0 0.0 - 0.2 %  CBC     Status: Abnormal   Collection Time: 05/25/19  6:37 AM  Result Value Ref Range   WBC 16.5 (H) 4.0 - 10.5 K/uL   RBC 4.20 (L) 4.22 - 5.81 MIL/uL   Hemoglobin 12.3 (L) 13.0 - 17.0 g/dL   HCT 28.4 (L) 13.2 - 44.0 %   MCV 87.6 80.0 - 100.0 fL   MCH 29.3 26.0 - 34.0 pg   MCHC 33.4 30.0 - 36.0 g/dL   RDW 10.2 72.5 - 36.6 %   Platelets 227 150 - 400 K/uL   nRBC 0.0 0.0 - 0.2 %  Basic metabolic panel     Status: Abnormal   Collection Time: 05/25/19  6:37 AM  Result Value Ref Range   Sodium 141 135 - 145 mmol/L   Potassium 4.4 3.5 - 5.1 mmol/L   Chloride 110 98 - 111 mmol/L   CO2 19 (L) 22 - 32 mmol/L   Glucose, Bld 187 (H) 70 - 99 mg/dL   BUN 22 8 - 23 mg/dL   Creatinine, Ser 4.40 (H) 0.61 - 1.24 mg/dL   Calcium 8.0 (L) 8.9 - 10.3 mg/dL   GFR calc non Af Amer 59 (L) >60 mL/min   GFR calc Af Amer >60 >60 mL/min   Anion gap 12 5 - 15    Assessment & Plan: The plan of care was discussed with the bedside nurse for the day who is in agreement with this plan and no additional concerns were raised.   Present on Admission: **None**    LOS: 1 day   Additional comments:I reviewed the patient's new clinical lab test results.   and I reviewed the patients new imaging test results.    [facility]  SAH - NSGY c/s (Dr. Maurice Small), follows commands, no h/o blood thinners, small new bleed on short interval CT head last PM, okay for LMWH 4/21 per NSGY. Keppra x7d for sz ppx. C-spine cleared this AM. Will repeat scan in AM. Concussion - SLP eval Comminuted left scapula fx- sling for now, ortho c/s (Dr. 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 I have reviewed this patient's available data, including medical history, events of note, physical examination and test results as part of my evaluation.

## 2019-05-25 NOTE — Social Work (Signed)
CSW met with pt at bedside. CSW introduced self and explained her role at the hospital. Pts wife was in the room, pt stated he is fine with her being present during the assessment. CSW completed sbirt. Pt scored a 2 on the sbirt scale. Pt stated he drinks a couple times a month and may have a shot or two after dinner. Pt stated that he did not have a problem with his alcohol use. Pt denied substance use. Pt did not need resources at this time.   Emeterio Reeve, Latanya Presser, [place] Social Worker [phone]
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
## 2019-05-25 NOTE — Progress Notes (Signed)
Patient with n/o expressive aphasia, NSG notified and stat head CT ordered. No other changes in neuro assessment.  Aris Lot, RN
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
## 2019-05-26 ENCOUNTER — Inpatient Hospital Stay (HOSPITAL_COMMUNITY): Payer: PRIVATE HEALTH INSURANCE

## 2019-05-26 LAB — CBC
HCT: 30.9 % — ABNORMAL LOW (ref 39.0–52.0)
HCT: 30.4 % — ABNORMAL LOW (ref 39.0–52.0)
Hemoglobin: 10.2 g/dL — ABNORMAL LOW (ref 13.0–17.0)
Hemoglobin: 10.2 g/dL — ABNORMAL LOW (ref 13.0–17.0)
MCH: 28.3 pg (ref 26.0–34.0)
MCH: 29 pg (ref 26.0–34.0)
MCHC: 33 g/dL (ref 30.0–36.0)
MCHC: 33.6 g/dL (ref 30.0–36.0)
MCV: 85.8 fL (ref 80.0–100.0)
MCV: 86.4 fL (ref 80.0–100.0)
Platelets: 166 10*3/uL (ref 150–400)
Platelets: 165 10*3/uL (ref 150–400)
RBC: 3.6 MIL/uL — ABNORMAL LOW (ref 4.22–5.81)
RBC: 3.52 MIL/uL — ABNORMAL LOW (ref 4.22–5.81)
RDW: 14.2 % (ref 11.5–15.5)
RDW: 14.3 % (ref 11.5–15.5)
WBC: 12.8 10*3/uL — ABNORMAL HIGH (ref 4.0–10.5)
WBC: 11.4 10*3/uL — ABNORMAL HIGH (ref 4.0–10.5)
nRBC: 0 % (ref 0.0–0.2)
nRBC: 0 % (ref 0.0–0.2)

## 2019-05-26 LAB — BASIC METABOLIC PANEL
Anion gap: 9 (ref 5–15)
BUN: 13 mg/dL (ref 8–23)
CO2: 23 mmol/L (ref 22–32)
Calcium: 8.2 mg/dL — ABNORMAL LOW (ref 8.9–10.3)
Chloride: 110 mmol/L (ref 98–111)
Creatinine, Ser: 0.87 mg/dL (ref 0.61–1.24)
GFR calc Af Amer: >60 mL/min (ref >60)
GFR calc non Af Amer: >60 mL/min (ref >60)
Glucose, Bld: 158 mg/dL — ABNORMAL HIGH (ref 70–99)
Potassium: 3.9 mmol/L (ref 3.5–5.1)
Sodium: 142 mmol/L (ref 135–145)

## 2019-05-26 LAB — MAGNESIUM: Magnesium: 2 mg/dL (ref 1.7–2.4)

## 2019-05-26 LAB — PHOSPHORUS: Phosphorus: 1.8 mg/dL — ABNORMAL LOW (ref 2.5–4.6)

## 2019-05-26 LAB — GLUCOSE, CAPILLARY: Glucose-Capillary: 143 mg/dL — ABNORMAL HIGH (ref 70–99)

## 2019-05-26 MED ORDER — HYDROCHLOROTHIAZIDE 25 MG PO TABS
25.0000 mg | ORAL_TABLET | Freq: Every day | ORAL | Status: DC
  Administered 2019-05-27 – 2019-06-01 (×4): 25 mg via ORAL
  Filled 2019-05-26 (×4): qty 1

## 2019-05-26 MED ORDER — ENOXAPARIN SODIUM 40 MG/0.4ML ~~LOC~~ SOLN
40.0000 mg | Freq: Two times a day (BID) | SUBCUTANEOUS | Status: DC
  Administered 2019-05-26 – 2019-06-03 (×16): 40 mg via SUBCUTANEOUS
  Filled 2019-05-26 (×16): qty 0.4

## 2019-05-26 NOTE — Progress Notes (Signed)
EEG Completed; Results Pending  

## 2019-05-26 NOTE — Progress Notes (Signed)
LTM started; tested event button; pt being monitored by [facility]. Educated Engineer, civil (consulting) and wife on event button.
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
## 2019-05-26 NOTE — Progress Notes (Signed)
CT scan of the shoulder as well as plain radiographs reviewed with the orthopedic trauma service.  At this time no plan for operative intervention in the left scapula.  He will need to be nonweightbearing and in the sling for 2 weeks.  We will begin pendulums and scapular retractions at 2 weeks.  He will be nonweightbearing for a total of 6 weeks.  I will need to see him in the office in about 2 weeks.

## 2019-05-26 NOTE — Progress Notes (Signed)
Trauma/Critical Care Follow Up Note  Subjective:    Overnight Issues: neuro exam change prompting CT head last PM with stable findings  Objective:  Vital signs for last 24 hours: Temp:  [98.7 F (37.1 C)-99.1 F (37.3 C)] 99.1 F (37.3 C) (04/22 0800) Pulse Rate:  [82-126] 109 (04/22 0825) Resp:  [16-21] 21 (04/22 0825) BP: (132-172)/(60-109) 165/71 (04/22 0825) SpO2:  [56 %-100 %] 97 % (04/22 0825)  Hemodynamic parameters for last 24 hours:    Intake/Output from previous day: 04/21 0701 - 04/22 0700 In: 3700.7 [P.O.:240; I.V.:2260.7; IV Piggyback:1200] Out: 1750 [Urine:1750]  Intake/Output this shift: Total I/O In: 100 [I.V.:100] Out: -   Vent settings for last 24 hours:    Physical Exam:  Gen: comfortable, no distress Neuro: non-focal exam, but repetitive speech and does not answer questions appropriately HEENT: PERRL Neck: supple CV: RRR Pulm: unlabored breathing Abd: soft, NT GU: spont voids Extr: wwp, no edema   Results for orders placed or performed during the hospital encounter of 05/14/2019 (from the past 24 hour(s))  CBC     Status: Abnormal   Collection Time: 05/25/19  2:14 PM  Result Value Ref Range   WBC 14.0 (H) 4.0 - 10.5 K/uL   RBC 3.84 (L) 4.22 - 5.81 MIL/uL   Hemoglobin 11.1 (L) 13.0 - 17.0 g/dL   HCT 32.7 (L) 39.0 - 52.0 %   MCV 85.2 80.0 - 100.0 fL   MCH 28.9 26.0 - 34.0 pg   MCHC 33.9 30.0 - 36.0 g/dL   RDW 14.3 11.5 - 15.5 %   Platelets 196 150 - 400 K/uL   nRBC 0.0 0.0 - 0.2 %  CBC     Status: Abnormal   Collection Time: 05/25/19  6:34 PM  Result Value Ref Range   WBC 12.8 (H) 4.0 - 10.5 K/uL   RBC 3.66 (L) 4.22 - 5.81 MIL/uL   Hemoglobin 10.7 (L) 13.0 - 17.0 g/dL   HCT 31.5 (L) 39.0 - 52.0 %   MCV 86.1 80.0 - 100.0 fL   MCH 29.2 26.0 - 34.0 pg   MCHC 34.0 30.0 - 36.0 g/dL   RDW 14.3 11.5 - 15.5 %   Platelets 185 150 - 400 K/uL   nRBC 0.0 0.0 - 0.2 %  CBC     Status: Abnormal   Collection Time: 05/26/19 12:56 AM  Result  Value Ref Range   WBC 12.8 (H) 4.0 - 10.5 K/uL   RBC 3.60 (L) 4.22 - 5.81 MIL/uL   Hemoglobin 10.2 (L) 13.0 - 17.0 g/dL   HCT 30.9 (L) 39.0 - 52.0 %   MCV 85.8 80.0 - 100.0 fL   MCH 28.3 26.0 - 34.0 pg   MCHC 33.0 30.0 - 36.0 g/dL   RDW 14.2 11.5 - 15.5 %   Platelets 166 150 - 400 K/uL   nRBC 0.0 0.0 - 0.2 %  CBC     Status: Abnormal   Collection Time: 05/26/19  8:02 AM  Result Value Ref Range   WBC 11.4 (H) 4.0 - 10.5 K/uL   RBC 3.52 (L) 4.22 - 5.81 MIL/uL   Hemoglobin 10.2 (L) 13.0 - 17.0 g/dL   HCT 30.4 (L) 39.0 - 52.0 %   MCV 86.4 80.0 - 100.0 fL   MCH 29.0 26.0 - 34.0 pg   MCHC 33.6 30.0 - 36.0 g/dL   RDW 14.3 11.5 - 15.5 %   Platelets 165 150 - 400 K/uL   nRBC 0.0 0.0 -  0.2 %  Glucose, capillary     Status: Abnormal   Collection Time: 05/26/19  8:31 AM  Result Value Ref Range   Glucose-Capillary 143 (H) 70 - 99 mg/dL    Assessment & Plan: The plan of care was discussed with the bedside nurse for the day, who is in agreement with this plan and no additional concerns were raised.   Present on Admission: **None**    LOS: 2 days   Additional comments:I reviewed the patient's new clinical lab test results.   and I reviewed the patients new imaging test results.    [facility]  SAH - NSGY c/s (Dr. Maurice Small), stable CT head last PM, Keppra x7d for sz ppx, but behavioral change concerning for subclinical seizure activity, will plan for EEG today. Concussion - SLP eval Comminuted left scapula fx- sling for now, ortho c/s (Dr. Aundria Rud), pending operative plan Left rib fx 2-5, displaced with flail segment of 4 and 5, small left hemothorax, left pulmonary contusion- pain control, pulm toilet, no chest tube needed  T6-9 spinous process fx - pain control Grade I spleen - no active extrav, hgb stable Incidental radiographic mild diverticulitis - monitor clinically, follow exam Psoas hemorrhage - monitor hemoglobin Adrenal mass, 3x3cm - needs outpt workup AKI - good UOP, await  today's BMP FEN - clears DVT - SCDs, start LMWH Dispo -  ICU, PT/OT   Diamantina Monks, MD Trauma & General Surgery Please use [email or web address] to contact on call provider  05/26/2019  *Care during the described time interval was provided by me. I have reviewed this patient's available data, including medical history, events of note, physical examination and test results as part of my evaluation.

## 2019-05-26 NOTE — Progress Notes (Signed)
OT Cancellation Note  Patient Details Name: Ian Velasquez MRN: [id number] DOB: 1955/09/07   Cancelled Treatment:    Reason Eval/Treat Not Completed: Other (comment)(Pt just falling asleep and wife request to let him sleep as he hasn't slept well. Will return as schedule allows.)  Minie Roadcap M Ladonte Verstraete Meleah Demeyer MSOT, OTR/L Acute Rehab Pager: [phone] Office: [phone] 05/26/2019, 3:04 PM

## 2019-05-26 NOTE — Progress Notes (Signed)
PT Cancellation Note  Patient Details Name: Ian Velasquez MRN: [id number] DOB: [birth year]   Cancelled Treatment:    Reason Eval/Treat Not Completed: Patient declined, no reason specified. Per discussion with RN pt's spouse requesting to hold therapies today as pt recently falling asleep and has not been sleeping recently. PT will attempt to return as time allows.   Arlyss Gandy 05/26/2019, 3:28 PM

## 2019-05-26 NOTE — Progress Notes (Signed)
Patient with n/o expressive aphasia and word repetition similarly to earlier in the shift. Trauma MD notified, no new orders since CT was done 04/21 at 1718. Will continue to monitor.    Sherrie George, RN BSN

## 2019-05-27 ENCOUNTER — Encounter (HOSPITAL_COMMUNITY): Payer: Self-pay

## 2019-05-27 DIAGNOSIS — G4733 Obstructive sleep apnea (adult) (pediatric): Secondary | ICD-10-CM | POA: Diagnosis present

## 2019-05-27 DIAGNOSIS — R000 Tachycardia, unspecified: Secondary | ICD-10-CM | POA: Diagnosis present

## 2019-05-27 DIAGNOSIS — S42112A Displaced fracture of body of scapula, left shoulder, initial encounter for closed fracture: Secondary | ICD-10-CM | POA: Diagnosis not present

## 2019-05-27 DIAGNOSIS — D62 Acute posthemorrhagic anemia: Secondary | ICD-10-CM | POA: Diagnosis present

## 2019-05-27 DIAGNOSIS — S069X9A Unspecified intracranial injury with loss of consciousness of unspecified duration, initial encounter: Secondary | ICD-10-CM | POA: Diagnosis present

## 2019-05-27 DIAGNOSIS — I609 Nontraumatic subarachnoid hemorrhage, unspecified: Secondary | ICD-10-CM | POA: Diagnosis not present

## 2019-05-27 DIAGNOSIS — T07XXXA Unspecified multiple injuries, initial encounter: Secondary | ICD-10-CM | POA: Diagnosis present

## 2019-05-27 DIAGNOSIS — M898X1 Other specified disorders of bone, shoulder: Secondary | ICD-10-CM | POA: Insufficient documentation

## 2019-05-27 DIAGNOSIS — G2581 Restless legs syndrome: Secondary | ICD-10-CM | POA: Diagnosis present

## 2019-05-27 DIAGNOSIS — I10 Essential (primary) hypertension: Secondary | ICD-10-CM

## 2019-05-27 LAB — CBC
HCT: 29.8 % — ABNORMAL LOW (ref 39.0–52.0)
Hemoglobin: 9.6 g/dL — ABNORMAL LOW (ref 13.0–17.0)
MCH: 28.7 pg (ref 26.0–34.0)
MCHC: 32.2 g/dL (ref 30.0–36.0)
MCV: 89 fL (ref 80.0–100.0)
Platelets: 164 10*3/uL (ref 150–400)
RBC: 3.35 MIL/uL — ABNORMAL LOW (ref 4.22–5.81)
RDW: 14.5 % (ref 11.5–15.5)
WBC: 8.2 10*3/uL (ref 4.0–10.5)
nRBC: 0 % (ref 0.0–0.2)

## 2019-05-27 LAB — BASIC METABOLIC PANEL
Anion gap: 7 (ref 5–15)
BUN: 12 mg/dL (ref 8–23)
CO2: 27 mmol/L (ref 22–32)
Calcium: 8.4 mg/dL — ABNORMAL LOW (ref 8.9–10.3)
Chloride: 110 mmol/L (ref 98–111)
Creatinine, Ser: 0.88 mg/dL (ref 0.61–1.24)
GFR calc Af Amer: >60 mL/min (ref >60)
GFR calc non Af Amer: >60 mL/min (ref >60)
Glucose, Bld: 141 mg/dL — ABNORMAL HIGH (ref 70–99)
Potassium: 3.9 mmol/L (ref 3.5–5.1)
Sodium: 144 mmol/L (ref 135–145)

## 2019-05-27 LAB — PHOSPHORUS: Phosphorus: 2.1 mg/dL — ABNORMAL LOW (ref 2.5–4.6)

## 2019-05-27 LAB — MAGNESIUM: Magnesium: 2.3 mg/dL (ref 1.7–2.4)

## 2019-05-27 MED ORDER — SODIUM PHOSPHATES 45 MMOLE/15ML IV SOLN
30.0000 mmol | Freq: Once | INTRAVENOUS | Status: AC
  Administered 2019-05-27: 30 mmol via INTRAVENOUS
  Filled 2019-05-27: qty 10

## 2019-05-27 MED ORDER — DOCUSATE SODIUM 100 MG PO CAPS
100.0000 mg | ORAL_CAPSULE | Freq: Two times a day (BID) | ORAL | Status: DC
  Administered 2019-05-27 – 2019-05-29 (×5): 100 mg via ORAL
  Filled 2019-05-27 (×6): qty 1

## 2019-05-27 MED ORDER — PANTOPRAZOLE SODIUM 40 MG PO TBEC
40.0000 mg | DELAYED_RELEASE_TABLET | Freq: Every day | ORAL | Status: DC
  Administered 2019-05-27 – 2019-06-01 (×4): 40 mg via ORAL
  Filled 2019-05-27 (×4): qty 1

## 2019-05-27 MED ORDER — LEVETIRACETAM 500 MG PO TABS
500.0000 mg | ORAL_TABLET | Freq: Two times a day (BID) | ORAL | Status: DC
  Administered 2019-05-27 – 2019-05-29 (×6): 500 mg via ORAL
  Filled 2019-05-27 (×6): qty 1

## 2019-05-27 MED ORDER — POLYETHYLENE GLYCOL 3350 17 G PO PACK
17.0000 g | PACK | Freq: Once | ORAL | Status: AC
  Administered 2019-05-27: 17 g via ORAL
  Filled 2019-05-27: qty 1
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
## 2019-05-27 NOTE — Progress Notes (Signed)
Called to provide clinical update to wife, Breeze Angell. All questions answered.   Diamantina Monks, MD General and Trauma Surgery [facility] Surgery

## 2019-05-27 NOTE — Progress Notes (Signed)
Inpatient Rehab Admissions Coordinator Note:   Per therapy  recommendations, pt was screened for CIR candidacy by Megan Salon, SLP-CCC.  At this time, we are recommending placing inpatient rehab consult order. .  Please contact me with questions.   Megan Salon, MS, CCC-SLP Rehab Admissions Coordinator  [phone] (celll) [phone] (office)
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
## 2019-05-27 NOTE — Progress Notes (Signed)
Trauma/Critical Care Follow Up Note  Subjective:    Overnight Issues: negative EEG  Objective:  Vital signs for last 24 hours: Temp:  [98 F (36.7 C)-99.1 F (37.3 C)] 98.3 F (36.8 C) (04/23 0800) Pulse Rate:  [84-140] 140 (04/23 1100) Resp:  [13-20] 19 (04/23 1100) BP: (116-172)/(65-140) 116/83 (04/23 1100) SpO2:  [91 %-100 %] 97 % (04/23 1100)  Hemodynamic parameters for last 24 hours:    Intake/Output from previous day: 04/22 0701 - 04/23 0700 In: 300.4 [I.V.:100; IV Piggyback:200.3] Out: 850 [Urine:850]  Intake/Output this shift: Total I/O In: 275.5 [P.O.:240; IV Piggyback:35.5] Out: -   Vent settings for last 24 hours:    Physical Exam:  Gen: comfortable, no distress Neuro: non-focal exam, more oriented and appropriate this AM HEENT: PERRL Neck: supple CV: RRR Pulm: unlabored breathing Abd: soft, NT GU: clear yellow urine Extr: wwp, no edema   Results for orders placed or performed during the hospital encounter of Jun 08, 2019 (from the past 24 hour(s))  Basic metabolic panel     Status: Abnormal   Collection Time: 05/27/19  6:53 AM  Result Value Ref Range   Sodium 144 135 - 145 mmol/L   Potassium 3.9 3.5 - 5.1 mmol/L   Chloride 110 98 - 111 mmol/L   CO2 27 22 - 32 mmol/L   Glucose, Bld 141 (H) 70 - 99 mg/dL   BUN 12 8 - 23 mg/dL   Creatinine, Ser 0.88 0.61 - 1.24 mg/dL   Calcium 8.4 (L) 8.9 - 10.3 mg/dL   GFR calc non Af Amer >60 >60 mL/min   GFR calc Af Amer >60 >60 mL/min   Anion gap 7 5 - 15  Magnesium     Status: None   Collection Time: 05/27/19  6:53 AM  Result Value Ref Range   Magnesium 2.3 1.7 - 2.4 mg/dL  Phosphorus     Status: Abnormal   Collection Time: 05/27/19  6:53 AM  Result Value Ref Range   Phosphorus 2.1 (L) 2.5 - 4.6 mg/dL  CBC     Status: Abnormal   Collection Time: 05/27/19  6:53 AM  Result Value Ref Range   WBC 8.2 4.0 - 10.5 K/uL   RBC 3.35 (L) 4.22 - 5.81 MIL/uL   Hemoglobin 9.6 (L) 13.0 - 17.0 g/dL   HCT 29.8 (L)  39.0 - 52.0 %   MCV 89.0 80.0 - 100.0 fL   MCH 28.7 26.0 - 34.0 pg   MCHC 32.2 30.0 - 36.0 g/dL   RDW 14.5 11.5 - 15.5 %   Platelets 164 150 - 400 K/uL   nRBC 0.0 0.0 - 0.2 %    Assessment & Plan: The plan of care was discussed with the bedside nurse for the day who is in agreement with this plan and no additional concerns were raised.   Present on Admission: **None**    LOS: 3 days   Additional comments:I reviewed the patient's new clinical lab test results.   and I reviewed the patients new imaging test results.    [facility]   SAH - NSGY c/s (Dr. Zada Finders), stable CT head last PM, Keppra x7d for sz ppx, EEG negative and neuro exam improved. Concussion - SLP eval Comminuted left scapula fx- sling for now, ortho c/s (Dr. Stann [place]), non-op, NWB, f/u in 2w Left rib fx 2-5, displaced with flail segment of 4 and 5, small left hemothorax, left pulmonary contusion- pain control, pulm toilet, no chest tube needed  T6-9 spinous process fx -  pain control Grade I spleen - no active extrav, hgb stable Incidental radiographic mild diverticulitis - monitor clinically, follow exam Psoas hemorrhage - monitor hemoglobin Adrenal mass, 3x3cm - needs outpt workup AKI - resolved FEN - reg diet DVT - SCDs, LMWH Dispo -  ICU, PT/OT  Diamantina Monks, MD Trauma & General Surgery Please use [email or web address] to contact on call provider  05/27/2019  *Care during the described time interval was provided by me. I have reviewed this patient's available data, including medical history, events of note, physical examination and test results as part of my evaluation.
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
## 2019-05-28 LAB — MAGNESIUM: Magnesium: 2.2 mg/dL (ref 1.7–2.4)

## 2019-05-28 LAB — CBC
HCT: 28.9 % — ABNORMAL LOW (ref 39.0–52.0)
Hemoglobin: 9.1 g/dL — ABNORMAL LOW (ref 13.0–17.0)
MCH: 28.4 pg (ref 26.0–34.0)
MCHC: 31.5 g/dL (ref 30.0–36.0)
MCV: 90.3 fL (ref 80.0–100.0)
Platelets: 193 10*3/uL (ref 150–400)
RBC: 3.2 MIL/uL — ABNORMAL LOW (ref 4.22–5.81)
RDW: 14.6 % (ref 11.5–15.5)
WBC: 7.6 10*3/uL (ref 4.0–10.5)
nRBC: 0.5 % — ABNORMAL HIGH (ref 0.0–0.2)

## 2019-05-28 LAB — BASIC METABOLIC PANEL
Anion gap: 11 (ref 5–15)
BUN: 18 mg/dL (ref 8–23)
CO2: 26 mmol/L (ref 22–32)
Calcium: 8.3 mg/dL — ABNORMAL LOW (ref 8.9–10.3)
Chloride: 108 mmol/L (ref 98–111)
Creatinine, Ser: 1.13 mg/dL (ref 0.61–1.24)
GFR calc Af Amer: >60 mL/min (ref >60)
GFR calc non Af Amer: >60 mL/min (ref >60)
Glucose, Bld: 138 mg/dL — ABNORMAL HIGH (ref 70–99)
Potassium: 3.5 mmol/L (ref 3.5–5.1)
Sodium: 145 mmol/L (ref 135–145)

## 2019-05-28 LAB — PHOSPHORUS: Phosphorus: 3 mg/dL (ref 2.5–4.6)

## 2019-05-28 MED ORDER — BACITRACIN-NEOMYCIN-POLYMYXIN OINTMENT TUBE
TOPICAL_OINTMENT | Freq: Every day | CUTANEOUS | Status: DC
  Administered 2019-06-06 – 2019-06-17 (×5): 1 via TOPICAL
  Filled 2019-05-28 (×3): qty 14

## 2019-05-28 MED ORDER — KETOROLAC TROMETHAMINE 30 MG/ML IJ SOLN
30.0000 mg | Freq: Once | INTRAMUSCULAR | Status: AC
  Administered 2019-05-28: 30 mg via INTRAVENOUS
  Filled 2019-05-28: qty 1
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
## 2019-05-28 NOTE — Progress Notes (Addendum)
Patient disconnected himself from the monitior and ambulated to the bathroom by himself without incident.  Patient urinated a large amount into the toilet and then ambulated back to bed following instuctions with them being said multiple times.
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
## 2019-05-29 ENCOUNTER — Inpatient Hospital Stay (HOSPITAL_COMMUNITY): Payer: PRIVATE HEALTH INSURANCE

## 2019-05-29 LAB — BLOOD GAS, ARTERIAL
Acid-Base Excess: 4.8 mmol/L — ABNORMAL HIGH (ref 0.0–2.0)
Bicarbonate: 27.8 mmol/L (ref 20.0–28.0)
FIO2: 21
O2 Saturation: 89.8 %
Patient temperature: 38.4
pCO2 arterial: 36.3 mmHg (ref 32.0–48.0)
pH, Arterial: 7.502 — ABNORMAL HIGH (ref 7.350–7.450)
pO2, Arterial: 57 mmHg — ABNORMAL LOW (ref 83.0–108.0)

## 2019-05-29 LAB — CBC
HCT: 30.5 % — ABNORMAL LOW (ref 39.0–52.0)
Hemoglobin: 9.8 g/dL — ABNORMAL LOW (ref 13.0–17.0)
MCH: 28.6 pg (ref 26.0–34.0)
MCHC: 32.1 g/dL (ref 30.0–36.0)
MCV: 88.9 fL (ref 80.0–100.0)
Platelets: 170 10*3/uL (ref 150–400)
RBC: 3.43 MIL/uL — ABNORMAL LOW (ref 4.22–5.81)
RDW: 14.8 % (ref 11.5–15.5)
WBC: 7.5 10*3/uL (ref 4.0–10.5)
nRBC: 0.7 % — ABNORMAL HIGH (ref 0.0–0.2)

## 2019-05-29 LAB — BASIC METABOLIC PANEL
Anion gap: 10 (ref 5–15)
BUN: 16 mg/dL (ref 8–23)
CO2: 28 mmol/L (ref 22–32)
Calcium: 8.6 mg/dL — ABNORMAL LOW (ref 8.9–10.3)
Chloride: 108 mmol/L (ref 98–111)
Creatinine, Ser: 0.95 mg/dL (ref 0.61–1.24)
GFR calc Af Amer: >60 mL/min (ref >60)
GFR calc non Af Amer: >60 mL/min (ref >60)
Glucose, Bld: 127 mg/dL — ABNORMAL HIGH (ref 70–99)
Potassium: 3.4 mmol/L — ABNORMAL LOW (ref 3.5–5.1)
Sodium: 146 mmol/L — ABNORMAL HIGH (ref 135–145)

## 2019-05-29 LAB — MAGNESIUM: Magnesium: 2.2 mg/dL (ref 1.7–2.4)

## 2019-05-29 LAB — PHOSPHORUS: Phosphorus: 3 mg/dL (ref 2.5–4.6)

## 2019-05-29 MED ORDER — SODIUM CHLORIDE 0.9 % IV BOLUS
1000.0000 mL | Freq: Once | INTRAVENOUS | Status: AC
  Administered 2019-05-29: 1000 mL via INTRAVENOUS

## 2019-05-29 MED ORDER — IPRATROPIUM-ALBUTEROL 0.5-2.5 (3) MG/3ML IN SOLN
3.0000 mL | RESPIRATORY_TRACT | Status: DC | PRN
  Administered 2019-05-29 – 2019-06-01 (×5): 3 mL via RESPIRATORY_TRACT
  Filled 2019-05-29 (×5): qty 3

## 2019-05-29 MED ORDER — ZOLPIDEM TARTRATE 5 MG PO TABS
5.0000 mg | ORAL_TABLET | Freq: Every evening | ORAL | Status: DC | PRN
  Administered 2019-05-29: 5 mg via ORAL
  Filled 2019-05-29: qty 1

## 2019-05-29 MED ORDER — SODIUM CHLORIDE 0.9 % BOLUS PEDS: 1000.0000 mL | Freq: Once | INTRAVENOUS | Status: DC

## 2019-05-29 MED ORDER — IPRATROPIUM-ALBUTEROL 0.5-2.5 (3) MG/3ML IN SOLN
RESPIRATORY_TRACT | Status: AC
  Filled 2019-05-29: qty 3

## 2019-05-29 MED ORDER — SODIUM CHLORIDE 0.9 % IV SOLN: INTRAVENOUS | Status: DC

## 2019-05-29 MED ORDER — ACETAMINOPHEN 650 MG RE SUPP
650.0000 mg | RECTAL | Status: DC | PRN
  Administered 2019-05-29 – 2019-06-01 (×9): 650 mg via RECTAL
  Filled 2019-05-29 (×11): qty 1

## 2019-05-29 MED ORDER — METOPROLOL TARTRATE 5 MG/5ML IV SOLN
10.0000 mg | INTRAVENOUS | Status: DC | PRN
  Administered 2019-05-29 – 2019-06-01 (×9): 10 mg via INTRAVENOUS
  Filled 2019-05-29 (×11): qty 10

## 2019-05-29 NOTE — Progress Notes (Addendum)
Dr. Derrell Lolling paged bc pt hr continues to be in 140's and up to 150's despite 5mg  Metoprolol given at 1505. Pt also maintains wheezing and increased rr despite Duoneb. Pt also did c/o chest pain when asked, but unable to specify where and pt's reliability is reasonably in question. 1L saline bolus ordered. presented to bedside and ordered STAT ABG and EKG. Awaiting results.   Pt also newly unable to swallow pills.  1800: ABG results show low P02. Pt put on 2L 02 [place] per Derrell Lolling.   1840: EKG completed by RN and NT, showing tachycardia and possible right inferior infarct. Derrell Lolling paged and notified. Ordered to give 10mg  IV Metoprolol. Medication given. Report handed off to Derrell Lolling, .

## 2019-05-29 NOTE — Progress Notes (Signed)
RT NOTES: ABG obtained and sent to lab. Lab tech Amanda notified.  
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
## 2019-05-29 NOTE — Progress Notes (Signed)
Called by RN as pt was having dyspnea and tachycardia Pt received a duoneb with min results. CXR with no sig changes Pt has had concentrated appearing urine. 1L bolus NS started.  HR seems to be decreasing. Condom cath in place  ABG and EKG pending

## 2019-05-29 NOTE — Progress Notes (Signed)
Pts wife requested RN to come into room because of change in pt status. Upon entry to room, pt's RR was increased in the 40s, HR in the 140s, and pt was audibly wheezing. Pt lungs were auscultated and sounded clear. IV fluids were paused. Dr. Derrell Lolling was paged for request for breathing tx. Dr. Derrell Lolling notifed of new pt status, along with RN giving IV Metoprolol, and Duoneb was ordered. Duoneb was given immediately. Metoprolol brought HR down to 110s initially. Pt continued to wheeze throughout Duoneb treatment. Derrell Lolling was paged again with request for chest xray and possibly Lasix. STAT portable chest xray ordered, Dr. Derrell Lolling ordered to get pt up to chair and use incentive spirometer. Incentive spirometer was educated on and used briefly before pt refused. Pt's bed was brought into chair position bc pt unable to get up to chair and wife refused. Will continue to monitor closely.

## 2019-05-29 NOTE — Progress Notes (Signed)
   Subjective/Chief Complaint: Pt with no acute changes Wife states min mobilization Taking little PO concentrated UOP   Objective: Vital signs in last 24 hours: Temp:  [98.3 F (36.8 C)-100.8 F (38.2 C)] 100.8 F (38.2 C) (04/25 0810) Pulse Rate:  [88-100] 88 (04/25 0810) Resp:  [14-26] 26 (04/25 0810) BP: (126-148)/(54-87) 133/66 (04/25 0810) SpO2:  [93 %-99 %] 95 % (04/25 0810) Last BM Date: (PTA)  Intake/Output from previous day: 04/24 0701 - 04/25 0700 In: 480 [P.O.:480] Out: 800 [Urine:800] Intake/Output this shift: No intake/output data recorded.  Physical Exam:  Gen: comfortable, no distress Neuro: non-focal exam, sleeping this AM HEENT: PERRL Neck: supple CV: RRR Pulm: unlabored breathing Abd: soft, NT GU: concentrated urine Extr: wwp, no edema  Lab Results:  Recent Labs    05/28/19 0659 05/29/19 0904  WBC 7.6 7.5  HGB 9.1* 9.8*  HCT 28.9* 30.5*  PLT 193 170   BMET Recent Labs    05/27/19 0653 05/28/19 0659  NA 144 145  K 3.9 3.5  CL 110 108  CO2 27 26  GLUCOSE 141* 138*  BUN 12 18  CREATININE 0.88 1.13  CALCIUM 8.4* 8.3*    Assessment/Plan: [facility]  SAH- NSGY c/s (Dr. Russella Dar head last PM, Keppra x7d for sz ppx, EEG negative and neuro exam improved. Concussion- SLP eval Comminuted left scapula fx- slingfor now, orthoc/s (Dr. Aundria Rud), non-op, NWB, f/u in 2w Left rib fx2-5,displaced with flail segment of 4 and 5,small left hemothorax,leftpulmonarycontusion- pain control, pulm toilet, no chest tube needed  T6-9 spinous process fx- pain control Grade I spleen - no active extrav, hgb stable Incidental radiographic mild diverticulitis- monitor clinically,follow exam Psoas hemorrhage- monitor hemoglobin Adrenal mass, 3x3cm - needs outpt workup AKI-resolved FEN -reg diet, will start IVF as pt not taking much PO, UOP concentrated. BMP Mon DVT - SCDs,LMWH Dispo -Progressive, PT/OT   LOS: 5 days     Axel Filler 05/29/2019

## 2019-05-29 NOTE — Progress Notes (Signed)
Patient seems very confused and is trying to get out of bed at 11pm. Dr. Andrey Campanile gave orders for [facility]. That didn't work. I gave him oxy and that helped him out a lot. Prior to oxy he was trying to pull off stickers, bracelets, IV's, knocking over drinks and climbing out of the bed. He urinated in the bed and while moving him to clean him up he was yelling about the pain he was having so oxy was given to relieve the pain

## 2019-05-30 ENCOUNTER — Inpatient Hospital Stay (HOSPITAL_COMMUNITY): Payer: PRIVATE HEALTH INSURANCE

## 2019-05-30 LAB — BASIC METABOLIC PANEL
Anion gap: 10 (ref 5–15)
BUN: 19 mg/dL (ref 8–23)
CO2: 28 mmol/L (ref 22–32)
Calcium: 8 mg/dL — ABNORMAL LOW (ref 8.9–10.3)
Chloride: 108 mmol/L (ref 98–111)
Creatinine, Ser: 1.26 mg/dL — ABNORMAL HIGH (ref 0.61–1.24)
GFR calc Af Amer: >60 mL/min (ref >60)
GFR calc non Af Amer: 60 mL/min — ABNORMAL LOW (ref >60)
Glucose, Bld: 132 mg/dL — ABNORMAL HIGH (ref 70–99)
Potassium: 3.2 mmol/L — ABNORMAL LOW (ref 3.5–5.1)
Sodium: 146 mmol/L — ABNORMAL HIGH (ref 135–145)

## 2019-05-30 LAB — URINALYSIS, ROUTINE W REFLEX MICROSCOPIC
Bacteria, UA: NONE SEEN
Bilirubin Urine: NEGATIVE
Glucose, UA: NEGATIVE mg/dL
Ketones, ur: NEGATIVE mg/dL
Leukocytes,Ua: NEGATIVE
Nitrite: NEGATIVE
Protein, ur: 30 mg/dL — AB
Specific Gravity, Urine: 1.021 (ref 1.005–1.030)
pH: 5 (ref 5.0–8.0)

## 2019-05-30 LAB — BLOOD GAS, ARTERIAL
Acid-Base Excess: 3.8 mmol/L — ABNORMAL HIGH (ref 0.0–2.0)
Bicarbonate: 26.8 mmol/L (ref 20.0–28.0)
Drawn by: 244901
FIO2: 32
O2 Saturation: 95 %
Patient temperature: 40.4
pCO2 arterial: 39.9 mmHg (ref 32.0–48.0)
pH, Arterial: 7.458 — ABNORMAL HIGH (ref 7.350–7.450)
pO2, Arterial: 82 mmHg — ABNORMAL LOW (ref 83.0–108.0)

## 2019-05-30 LAB — CBC
HCT: 31.9 % — ABNORMAL LOW (ref 39.0–52.0)
Hemoglobin: 10.1 g/dL — ABNORMAL LOW (ref 13.0–17.0)
MCH: 28.7 pg (ref 26.0–34.0)
MCHC: 31.7 g/dL (ref 30.0–36.0)
MCV: 90.6 fL (ref 80.0–100.0)
Platelets: 183 10*3/uL (ref 150–400)
RBC: 3.52 MIL/uL — ABNORMAL LOW (ref 4.22–5.81)
RDW: 15.5 % (ref 11.5–15.5)
WBC: 10.1 10*3/uL (ref 4.0–10.5)
nRBC: 1.1 % — ABNORMAL HIGH (ref 0.0–0.2)

## 2019-05-30 LAB — PREALBUMIN: Prealbumin: 10.3 mg/dL — ABNORMAL LOW (ref 18–38)

## 2019-05-30 LAB — PHOSPHORUS: Phosphorus: 3.2 mg/dL (ref 2.5–4.6)

## 2019-05-30 LAB — GLUCOSE, CAPILLARY: Glucose-Capillary: 119 mg/dL — ABNORMAL HIGH (ref 70–99)

## 2019-05-30 LAB — MAGNESIUM: Magnesium: 1.9 mg/dL (ref 1.7–2.4)

## 2019-05-30 MED ORDER — IPRATROPIUM-ALBUTEROL 0.5-2.5 (3) MG/3ML IN SOLN: 3.0000 mL | Freq: Four times a day (QID) | RESPIRATORY_TRACT | Status: DC

## 2019-05-30 MED ORDER — LEVETIRACETAM IN NACL 500 MG/100ML IV SOLN
500.0000 mg | Freq: Two times a day (BID) | INTRAVENOUS | Status: AC
  Administered 2019-05-30 – 2019-05-31 (×4): 500 mg via INTRAVENOUS
  Filled 2019-05-30 (×4): qty 100

## 2019-05-30 MED ORDER — BISACODYL 10 MG RE SUPP
10.0000 mg | Freq: Once | RECTAL | Status: AC
  Administered 2019-05-30: 10 mg via RECTAL
  Filled 2019-05-30: qty 1

## 2019-05-30 MED ORDER — IOHEXOL 9 MG/ML PO SOLN
500.0000 mL | ORAL | Status: AC
  Administered 2019-05-30 (×2): 500 mL via ORAL

## 2019-05-30 MED ORDER — IPRATROPIUM-ALBUTEROL 0.5-2.5 (3) MG/3ML IN SOLN
3.0000 mL | RESPIRATORY_TRACT | Status: AC
  Administered 2019-05-30 – 2019-06-01 (×10): 3 mL via RESPIRATORY_TRACT
  Filled 2019-05-30 (×11): qty 3

## 2019-05-30 MED ORDER — IOHEXOL 300 MG/ML  SOLN
80.0000 mL | Freq: Once | INTRAMUSCULAR | Status: AC | PRN
  Administered 2019-05-30: 80 mL via INTRAVENOUS

## 2019-05-30 MED ORDER — METOPROLOL TARTRATE 5 MG/5ML IV SOLN
INTRAVENOUS | Status: AC
  Filled 2019-05-30: qty 5

## 2019-05-30 MED ORDER — RACEPINEPHRINE HCL 2.25 % IN NEBU
0.5000 mL | INHALATION_SOLUTION | Freq: Once | RESPIRATORY_TRACT | Status: AC
  Administered 2019-05-30: 0.5 mL via RESPIRATORY_TRACT
  Filled 2019-05-30: qty 0.5

## 2019-05-30 MED ORDER — METHOCARBAMOL 1000 MG/10ML IJ SOLN
1000.0000 mg | Freq: Three times a day (TID) | INTRAVENOUS | Status: DC | PRN
  Administered 2019-06-01: 1000 mg via INTRAVENOUS
  Filled 2019-05-30 (×3): qty 10

## 2019-05-30 MED ORDER — POTASSIUM CHLORIDE 10 MEQ/100ML IV SOLN
10.0000 meq | INTRAVENOUS | Status: AC
  Administered 2019-05-30 (×3): 10 meq via INTRAVENOUS
  Filled 2019-05-30 (×3): qty 100

## 2019-05-30 MED ORDER — IOHEXOL 350 MG/ML SOLN
75.0000 mL | Freq: Once | INTRAVENOUS | Status: AC | PRN
  Administered 2019-05-30: 75 mL via INTRAVENOUS

## 2019-05-30 NOTE — Progress Notes (Signed)
OT Cancellation Note  Patient Details Name: Avrey Hyser MRN: [id number] DOB: 1955-10-28   Cancelled Treatment:    Reason Eval/Treat Not Completed: Medical issues which prohibited therapy. Per RN hold today. Pt with rapid called twice last night, elevated temp (103), Poor RR, and HR in 120s at rest. Will return as schedule allows and pt medically stable.  Lujuana Kapler M Jarmaine Ehrler Geneal Huebert MSOT, OTR/L Acute Rehab Pager: [phone] Office: [phone] 05/30/2019, 10:23 AM

## 2019-05-30 NOTE — Progress Notes (Signed)
Spoke with patient's wife, Parthiv Mucci, on the phone. She would like an update from an MD, specifically about the results from a CT scan performed earlier in the day. Her number is [phone]. Provided her a general update on the patient's orientation status and plan for temperature and HR control overnight.   Allegra Grana RN
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
## 2019-05-30 NOTE — Progress Notes (Signed)
ABG drawn & sent to lab at 1643. Lab notified before I sent it down.

## 2019-05-30 NOTE — Progress Notes (Signed)
Pt continues to have labored, tachypnic breathing with minimal upper respiratory wheezing. Brooke, [place], presented to pt room. See new orders for chest CT, blood cultures, and urinalysis. Pt rectal temp 103.8 - rectal Tylenol given, as well as ice packs and fans.
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
## 2019-05-30 NOTE — Progress Notes (Signed)
Pt to CT with SWOT RN

## 2019-05-30 NOTE — Procedures (Signed)
Cortrak  Person Inserting Tube:  Jimmie Rueter, Verdon Cummins, RD Tube Type:  Cortrak - 43 inches Tube Location:  Right nare Initial Placement:  Stomach Secured by: Bridle Technique Used to Measure Tube Placement:  Documented cm marking at nare/ corner of mouth Cortrak Secured At:  72 cm    Cortrak Tube Team Note:  Consult received to place a Cortrak feeding tube.   No x-ray is required. RN may begin using tube.    If the tube becomes dislodged please keep the tube and contact the Cortrak team at [email or web address] ([email or web address]) for replacement.  If after hours and replacement cannot be delayed, place a NG tube and confirm placement with an abdominal x-ray.    Eugene Gavia, MS, RD, LDN RD pager number and weekend/on-call pager number located in [place].

## 2019-05-30 NOTE — Progress Notes (Signed)
RN went with pt to CT

## 2019-05-30 NOTE — Progress Notes (Signed)
OT Cancellation Note  Patient Details Name: Castor Gittleman MRN: [id number] DOB: Jun 18, 1955   Cancelled Treatment:    Reason Eval/Treat Not Completed: Patient at procedure or test/ unavailable(CT. Will return as schedule allows.)  [facility] MSOT, OTR/L Acute Rehab Pager: [phone] Office: [phone] 05/30/2019, 9:05 AM
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
## 2019-05-30 NOTE — Progress Notes (Signed)
Inpatient Rehab Admissions:  Inpatient Rehab Consult received.  I met with patient and his wife at the bedside for rehabilitation assessment and to discuss goals and expectations of an inpatient rehab admission.  Pt restless, perseverative, and not oriented.  Wife open to rehab but not ready to discuss at this time given pt state.  Will continue to follow and return to discuss with her at a later date.  Note workup for fever in progress.   Signed: Shann Medal, PT, DPT Admissions Coordinator [phone] 05/30/19  12:16 PM

## 2019-05-30 NOTE — Progress Notes (Signed)
RN went to give IV Metoprolol at shift change. Pt found to have coretrak leaking out yellow fluid that appeared to be gastric contents where the cap of the coretrak had come off. The bedding was moderately soaked. Pt's skin appearing more yellow, as well as sclera of eyes. Report given to Allegra Grana, RN

## 2019-05-30 NOTE — Progress Notes (Signed)
PT Cancellation Note  Patient Details Name: Ian Velasquez MRN: [id number] DOB: 1955/09/26   Cancelled Treatment:    Reason Eval/Treat Not Completed: Patient not medically ready. Pt with several episodes of tachycardia and increased RR. Pt with regression in cognition and function. Pt went to CT earlier today. RN asked to hold today. PT to return as able, as appropriate.  Lewis Shock, PT, DPT Acute Rehabilitation Services Pager #: [phone] Office #: [phone]    [name] 05/30/2019, 10:39 AM

## 2019-05-31 ENCOUNTER — Inpatient Hospital Stay (HOSPITAL_COMMUNITY): Payer: PRIVATE HEALTH INSURANCE

## 2019-05-31 ENCOUNTER — Other Ambulatory Visit: Payer: Self-pay | Admitting: Infectious Disease

## 2019-05-31 DIAGNOSIS — B9561 Methicillin susceptible Staphylococcus aureus infection as the cause of diseases classified elsewhere: Secondary | ICD-10-CM | POA: Diagnosis not present

## 2019-05-31 DIAGNOSIS — J90 Pleural effusion, not elsewhere classified: Secondary | ICD-10-CM

## 2019-05-31 DIAGNOSIS — R7881 Bacteremia: Secondary | ICD-10-CM | POA: Diagnosis not present

## 2019-05-31 DIAGNOSIS — S2242XA Multiple fractures of ribs, left side, initial encounter for closed fracture: Secondary | ICD-10-CM | POA: Diagnosis present

## 2019-05-31 DIAGNOSIS — R410 Disorientation, unspecified: Secondary | ICD-10-CM

## 2019-05-31 DIAGNOSIS — S36030A Superficial (capsular) laceration of spleen, initial encounter: Secondary | ICD-10-CM

## 2019-05-31 DIAGNOSIS — S066X9A Traumatic subarachnoid hemorrhage with loss of consciousness of unspecified duration, initial encounter: Secondary | ICD-10-CM

## 2019-05-31 LAB — BLOOD CULTURE ID PANEL (REFLEXED)

## 2019-05-31 LAB — CBC
HCT: 32.3 % — ABNORMAL LOW (ref 39.0–52.0)
Hemoglobin: 9.9 g/dL — ABNORMAL LOW (ref 13.0–17.0)
MCH: 28.7 pg (ref 26.0–34.0)
MCHC: 30.7 g/dL (ref 30.0–36.0)
MCV: 93.6 fL (ref 80.0–100.0)
Platelets: 169 10*3/uL (ref 150–400)
RBC: 3.45 MIL/uL — ABNORMAL LOW (ref 4.22–5.81)
RDW: 15.9 % — ABNORMAL HIGH (ref 11.5–15.5)
WBC: 10.1 10*3/uL (ref 4.0–10.5)
nRBC: 0.4 % — ABNORMAL HIGH (ref 0.0–0.2)

## 2019-05-31 LAB — COMPREHENSIVE METABOLIC PANEL
ALT: 63 U/L — ABNORMAL HIGH (ref 0–44)
AST: 75 U/L — ABNORMAL HIGH (ref 15–41)
Albumin: 2.5 g/dL — ABNORMAL LOW (ref 3.5–5.0)
Alkaline Phosphatase: 50 U/L (ref 38–126)
Anion gap: 10 (ref 5–15)
BUN: 24 mg/dL — ABNORMAL HIGH (ref 8–23)
CO2: 25 mmol/L (ref 22–32)
Calcium: 7.8 mg/dL — ABNORMAL LOW (ref 8.9–10.3)
Chloride: 112 mmol/L — ABNORMAL HIGH (ref 98–111)
Creatinine, Ser: 1.58 mg/dL — ABNORMAL HIGH (ref 0.61–1.24)
GFR calc Af Amer: 53 mL/min — ABNORMAL LOW (ref >60)
GFR calc non Af Amer: 46 mL/min — ABNORMAL LOW (ref >60)
Glucose, Bld: 107 mg/dL — ABNORMAL HIGH (ref 70–99)
Potassium: 3.8 mmol/L (ref 3.5–5.1)
Sodium: 147 mmol/L — ABNORMAL HIGH (ref 135–145)
Total Bilirubin: 8.8 mg/dL — ABNORMAL HIGH (ref 0.3–1.2)
Total Protein: 6 g/dL — ABNORMAL LOW (ref 6.5–8.1)

## 2019-05-31 LAB — URINE CULTURE: Culture: <10000 — AB

## 2019-05-31 LAB — ECHOCARDIOGRAM COMPLETE
Height: 75 in
Weight: 4560 oz

## 2019-05-31 LAB — PHOSPHORUS: Phosphorus: 3.5 mg/dL (ref 2.5–4.6)

## 2019-05-31 LAB — MAGNESIUM: Magnesium: 2.2 mg/dL (ref 1.7–2.4)

## 2019-05-31 MED ORDER — ORAL CARE MOUTH RINSE
15.0000 mL | Freq: Two times a day (BID) | OROMUCOSAL | Status: DC
  Administered 2019-05-31 – 2019-06-01 (×4): 15 mL via OROMUCOSAL

## 2019-05-31 MED ORDER — DOCUSATE SODIUM 50 MG/5ML PO LIQD
100.0000 mg | Freq: Two times a day (BID) | ORAL | Status: DC
  Administered 2019-05-31 – 2019-06-01 (×4): 100 mg
  Filled 2019-05-31 (×4): qty 10

## 2019-05-31 MED ORDER — OXYCODONE HCL 5 MG/5ML PO SOLN
5.0000 mg | ORAL | Status: DC | PRN
  Administered 2019-06-01 – 2019-06-10 (×7): 10 mg
  Filled 2019-05-31 (×7): qty 10

## 2019-05-31 MED ORDER — BISACODYL 10 MG RE SUPP
10.0000 mg | Freq: Once | RECTAL | Status: AC
  Administered 2019-05-31: 10 mg via RECTAL
  Filled 2019-05-31: qty 1

## 2019-05-31 MED ORDER — SODIUM CHLORIDE 0.9 % IV SOLN
12.0000 g | INTRAVENOUS | Status: DC
  Administered 2019-05-31 – 2019-06-01 (×2): 12 g via INTRAVENOUS
  Filled 2019-05-31 (×3): qty 12000

## 2019-05-31 MED ORDER — CHLORHEXIDINE GLUCONATE 0.12 % MT SOLN
15.0000 mL | Freq: Two times a day (BID) | OROMUCOSAL | Status: DC
  Administered 2019-05-31 – 2019-06-01 (×4): 15 mL via OROMUCOSAL
  Filled 2019-05-31 (×3): qty 15

## 2019-05-31 MED ORDER — ALBUMIN HUMAN 5 % IV SOLN
25.0000 g | Freq: Once | INTRAVENOUS | Status: AC
  Administered 2019-05-31: 25 g via INTRAVENOUS
  Filled 2019-05-31: qty 500

## 2019-05-31 MED ORDER — NAFCILLIN SODIUM 2 G IJ SOLR: 2.0000 g | INTRAMUSCULAR | Status: DC

## 2019-05-31 MED ORDER — SODIUM CHLORIDE 0.9 % IV SOLN
2.0000 g | INTRAVENOUS | Status: DC
  Administered 2019-05-31 (×3): 2 g via INTRAVENOUS
  Filled 2019-05-31 (×6): qty 2000

## 2019-05-31 MED ORDER — OSMOLITE 1.5 CAL PO LIQD
1000.0000 mL | ORAL | Status: DC
  Administered 2019-05-31: 1000 mL
  Filled 2019-05-31 (×3): qty 1000
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
## 2019-05-31 NOTE — Progress Notes (Signed)
Pt refused second peripheral IV, per IV RN. Pt's human albumin has been on hold since his Nafcillin was hung at 1343. It was explained to the family that the pt needed a second IV access because these medications are not compatible. The family was compliant at that time. However, they are now requesting a PICC line. PA, Nehemiah Settle, was consulted concerning this, but a PICC is not currently an option due to the pt's bacteremia. Nehemiah Settle was also informed of the pt's albumin not running due to limited access and family refusal. The reasoning that a PICC is not an option was explained to the family and they are agreeable to a peripheral IV. Second IV consult placed. Human albumin will be re-hung once second access is established.
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
## 2019-05-31 NOTE — Progress Notes (Signed)
VAST consulted to obtain IV access. Spoke with pt's nurse, Rayfield Citizen who stated she spoke with MD and pt is not currently a PICC candidate as he has bacteremia. She has explained this to pt and family and they have now stated they will allow IV access to be obtained.
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
## 2019-05-31 NOTE — Progress Notes (Signed)
  Echocardiogram 2D Echocardiogram has been performed.  Tye Savoy 05/31/2019, 1:34 PM

## 2019-05-31 NOTE — Progress Notes (Addendum)
Pt's right pupil newly 71mm and sluggish, while left pupil is 81mm and brisk. Janee Morn paged, seemed unconcerned since pt's neuro status appears similar to his normal. Janee Morn said to reach out to [facility] for further concerns regarding this. Second RN, Ursula Beath Maerlander verified pupil differences. Message left with answering service for Dr. Maurice Small.  1714: Call back from neurosurgery. Also unconcerned unless neuro exam changes.

## 2019-05-31 NOTE — Progress Notes (Signed)
Pt's family requesting update on echocardiogram done today.

## 2019-05-31 NOTE — Progress Notes (Signed)
Patient w MSSAB  Not on antimicrobials  Will start nafcillin 2 g Iv q 4 hrs  Formal consult in am
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
## 2019-06-01 DIAGNOSIS — R7881 Bacteremia: Secondary | ICD-10-CM | POA: Diagnosis not present

## 2019-06-01 DIAGNOSIS — S066X9A Traumatic subarachnoid hemorrhage with loss of consciousness of unspecified duration, initial encounter: Secondary | ICD-10-CM | POA: Diagnosis not present

## 2019-06-01 DIAGNOSIS — S069X9A Unspecified intracranial injury with loss of consciousness of unspecified duration, initial encounter: Secondary | ICD-10-CM | POA: Diagnosis not present

## 2019-06-01 DIAGNOSIS — S2239XA Fracture of one rib, unspecified side, initial encounter for closed fracture: Secondary | ICD-10-CM

## 2019-06-01 DIAGNOSIS — B9561 Methicillin susceptible Staphylococcus aureus infection as the cause of diseases classified elsewhere: Secondary | ICD-10-CM | POA: Diagnosis not present

## 2019-06-01 LAB — CBC
HCT: 28 % — ABNORMAL LOW (ref 39.0–52.0)
Hemoglobin: 8.9 g/dL — ABNORMAL LOW (ref 13.0–17.0)
MCH: 29.4 pg (ref 26.0–34.0)
MCHC: 31.8 g/dL (ref 30.0–36.0)
MCV: 92.4 fL (ref 80.0–100.0)
Platelets: 136 10*3/uL — ABNORMAL LOW (ref 150–400)
RBC: 3.03 MIL/uL — ABNORMAL LOW (ref 4.22–5.81)
RDW: 16.2 % — ABNORMAL HIGH (ref 11.5–15.5)
WBC: 5.7 10*3/uL (ref 4.0–10.5)
nRBC: 0.5 % — ABNORMAL HIGH (ref 0.0–0.2)

## 2019-06-01 LAB — CULTURE, BLOOD (ROUTINE X 2)

## 2019-06-01 LAB — BASIC METABOLIC PANEL
Anion gap: 12 (ref 5–15)
BUN: 33 mg/dL — ABNORMAL HIGH (ref 8–23)
CO2: 25 mmol/L (ref 22–32)
Calcium: 8 mg/dL — ABNORMAL LOW (ref 8.9–10.3)
Chloride: 112 mmol/L — ABNORMAL HIGH (ref 98–111)
Creatinine, Ser: 1.4 mg/dL — ABNORMAL HIGH (ref 0.61–1.24)
GFR calc Af Amer: >60 mL/min (ref >60)
GFR calc non Af Amer: 53 mL/min — ABNORMAL LOW (ref >60)
Glucose, Bld: 186 mg/dL — ABNORMAL HIGH (ref 70–99)
Potassium: 3.1 mmol/L — ABNORMAL LOW (ref 3.5–5.1)
Sodium: 149 mmol/L — ABNORMAL HIGH (ref 135–145)

## 2019-06-01 MED ORDER — PRO-STAT SUGAR FREE PO LIQD
30.0000 mL | Freq: Every day | ORAL | Status: DC
  Filled 2019-06-01: qty 30

## 2019-06-01 MED ORDER — POTASSIUM CHLORIDE 20 MEQ PO PACK
40.0000 meq | PACK | Freq: Two times a day (BID) | ORAL | Status: AC
  Administered 2019-06-01 (×2): 40 meq
  Filled 2019-06-01 (×2): qty 2

## 2019-06-01 MED ORDER — ENSURE ENLIVE PO LIQD
237.0000 mL | Freq: Two times a day (BID) | ORAL | Status: DC
  Administered 2019-06-01: 237 mL via ORAL

## 2019-06-01 MED ORDER — LACTATED RINGERS IV SOLN: INTRAVENOUS | Status: DC

## 2019-06-01 NOTE — Progress Notes (Signed)
Pt found to have removed Cortrak. Tube feeding stopped and on-call MD notified.

## 2019-06-01 NOTE — Procedures (Signed)
Cortrak  Person Inserting Tube:  Caisley Baxendale C, RD Tube Type:  Cortrak - 43 inches Tube Location:  Left nare Initial Placement:  Stomach Secured by: Bridle Technique Used to Measure Tube Placement:  Documented cm marking at nare/ corner of mouth Cortrak Secured At:  70 cm    Cortrak Tube Team Note:  Consult received to place a Cortrak feeding tube.   No x-ray is required. RN may begin using tube.   If the tube becomes dislodged please keep the tube and contact the Cortrak team at [email or web address] (password [email or web address]) for replacement.  If after hours and replacement cannot be delayed, place a NG tube and confirm placement with an abdominal x-ray.    Jaber Dunlow P., RD, LDN, CNSC See AMiON for contact information    

## 2019-06-01 NOTE — Plan of Care (Signed)
  Problem: Education: Goal: Knowledge of General Education information will improve Description: Including pain rating scale, medication(s)/side effects and non-pharmacologic comfort measures Outcome: Progressing   Problem: Health Behavior/Discharge Planning: Goal: Ability to manage health-related needs will improve Outcome: Progressing   Problem: Clinical Measurements: Goal: Ability to maintain clinical measurements within normal limits will improve Outcome: Progressing Goal: Will remain free from infection Outcome: Progressing Goal: Diagnostic test results will improve Outcome: Progressing Goal: Respiratory complications will improve Outcome: Progressing Goal: Cardiovascular complication will be avoided Outcome: Progressing   Problem: Activity: Goal: Risk for activity intolerance will decrease Outcome: Progressing   Problem: Nutrition: Goal: Adequate nutrition will be maintained Outcome: Progressing   Problem: Coping: Goal: Level of anxiety will decrease Outcome: Progressing   Problem: Elimination: Goal: Will not experience complications related to bowel motility Outcome: Progressing Goal: Will not experience complications related to urinary retention Outcome: Progressing   Problem: Pain Managment: Goal: General experience of comfort will improve Outcome: Progressing   Problem: Safety: Goal: Ability to remain free from injury will improve Outcome: Progressing   Problem: Skin Integrity: Goal: Risk for impaired skin integrity will decrease Outcome: Progressing   Problem: Education: Goal: Knowledge of the prescribed therapeutic regimen Outcome: Progressing Goal: Knowledge of disease or condition will improve Outcome: Progressing   Problem: Clinical Measurements: Goal: Neurologic status will improve Outcome: Progressing   Problem: Tissue Perfusion: Goal: Ability to maintain intracranial pressure will improve Outcome: Progressing   Problem:  Respiratory: Goal: Will regain and/or maintain adequate ventilation Outcome: Progressing   Problem: Skin Integrity: Goal: Risk for impaired skin integrity will decrease Outcome: Progressing Goal: Demonstration of wound healing without infection will improve Outcome: Progressing   Problem: Psychosocial: Goal: Ability to verbalize positive feelings about self will improve Outcome: Progressing Goal: Ability to participate in self-care as condition permits will improve Outcome: Progressing Goal: Ability to identify appropriate support needs will improve Outcome: Progressing   Problem: Health Behavior/Discharge Planning: Goal: Ability to manage health-related needs will improve Outcome: Progressing   Problem: Nutritional: Goal: Risk of aspiration will decrease Outcome: Progressing Goal: Dietary intake will improve Outcome: Progressing   Problem: Communication: Goal: Ability to communicate needs accurately will improve Outcome: Progressing   
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
## 2019-06-01 NOTE — Progress Notes (Signed)
Inpatient Rehab Admissions:  I met with patient and his wife at the bedside for rehabilitation assessment and to discuss goals and expectations of an inpatient rehab admission.  Pt restless, painful with movement in the bed.  Wife is interested in [place] when medically ready.  Will need to obtain insurance authorization for possible admission.  Will continue to follow for timing.    Signed: Shann Medal, PT, DPT Admissions Coordinator [phone] 06/01/19  2:09 PM
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
## 2019-06-01 NOTE — Progress Notes (Signed)
    [facility] has been requested to perform a transesophageal echocardiogram on Ian Velasquez for bacteremia.  After careful review of history and examination, the risks and benefits of transesophageal echocardiogram have been explained including risks of esophageal damage, perforation (1:10,000 risk), bleeding, pharyngeal hematoma as well as other potential complications associated with conscious sedation including aspiration, arrhythmia, respiratory failure and death. Alternatives to treatment were discussed, questions were answered with patients wife who is acting as his POA. Patient and family are willing to proceed.   Georgie Chard, NP  06/01/2019 3:14 PM
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
## 2019-06-02 ENCOUNTER — Inpatient Hospital Stay (HOSPITAL_COMMUNITY): Payer: PRIVATE HEALTH INSURANCE | Admitting: Certified Registered"

## 2019-06-02 ENCOUNTER — Inpatient Hospital Stay (HOSPITAL_COMMUNITY): Payer: PRIVATE HEALTH INSURANCE

## 2019-06-02 DIAGNOSIS — R079 Chest pain, unspecified: Secondary | ICD-10-CM

## 2019-06-02 DIAGNOSIS — I248 Other forms of acute ischemic heart disease: Secondary | ICD-10-CM | POA: Diagnosis not present

## 2019-06-02 LAB — BASIC METABOLIC PANEL
Anion gap: 17 — ABNORMAL HIGH (ref 5–15)
BUN: 50 mg/dL — ABNORMAL HIGH (ref 8–23)
CO2: 22 mmol/L (ref 22–32)
Calcium: 8.3 mg/dL — ABNORMAL LOW (ref 8.9–10.3)
Chloride: 116 mmol/L — ABNORMAL HIGH (ref 98–111)
Creatinine, Ser: 1.8 mg/dL — ABNORMAL HIGH (ref 0.61–1.24)
GFR calc Af Amer: 45 mL/min — ABNORMAL LOW (ref >60)
GFR calc non Af Amer: 39 mL/min — ABNORMAL LOW (ref >60)
Glucose, Bld: 162 mg/dL — ABNORMAL HIGH (ref 70–99)
Potassium: 3.3 mmol/L — ABNORMAL LOW (ref 3.5–5.1)
Sodium: 155 mmol/L — ABNORMAL HIGH (ref 135–145)

## 2019-06-02 LAB — POCT I-STAT 7, (LYTES, BLD GAS, ICA,H+H)
Acid-Base Excess: 2 mmol/L (ref 0.0–2.0)
Bicarbonate: 27.8 mmol/L (ref 20.0–28.0)
Calcium, Ion: 1.1 mmol/L — ABNORMAL LOW (ref 1.15–1.40)
HCT: 30 % — ABNORMAL LOW (ref 39.0–52.0)
Hemoglobin: 10.2 g/dL — ABNORMAL LOW (ref 13.0–17.0)
O2 Saturation: 97 %
Patient temperature: 98.2
Potassium: 4.1 mmol/L (ref 3.5–5.1)
Sodium: 154 mmol/L — ABNORMAL HIGH (ref 135–145)
TCO2: 29 mmol/L (ref 22–32)
pCO2 arterial: 48.6 mmHg — ABNORMAL HIGH (ref 32.0–48.0)
pH, Arterial: 7.364 (ref 7.350–7.450)
pO2, Arterial: 92 mmHg (ref 83.0–108.0)

## 2019-06-02 LAB — CBC
HCT: 35.8 % — ABNORMAL LOW (ref 39.0–52.0)
Hemoglobin: 11 g/dL — ABNORMAL LOW (ref 13.0–17.0)
MCH: 29.2 pg (ref 26.0–34.0)
MCHC: 30.7 g/dL (ref 30.0–36.0)
MCV: 95 fL (ref 80.0–100.0)
Platelets: 210 10*3/uL (ref 150–400)
RBC: 3.77 MIL/uL — ABNORMAL LOW (ref 4.22–5.81)
RDW: 17 % — ABNORMAL HIGH (ref 11.5–15.5)
WBC: 8 10*3/uL (ref 4.0–10.5)
nRBC: 1.5 % — ABNORMAL HIGH (ref 0.0–0.2)

## 2019-06-02 LAB — ECHOCARDIOGRAM LIMITED
Height: 75 in
Weight: 4560 oz

## 2019-06-02 LAB — TROPONIN I (HIGH SENSITIVITY)
Troponin I (High Sensitivity): 15 ng/L (ref <18)
Troponin I (High Sensitivity): 17 ng/L (ref <18)

## 2019-06-02 LAB — TRIGLYCERIDES: Triglycerides: 563 mg/dL — ABNORMAL HIGH (ref <150)

## 2019-06-02 LAB — LACTIC ACID, PLASMA
Lactic Acid, Venous: 3.9 mmol/L (ref 0.5–1.9)
Lactic Acid, Venous: 4 mmol/L (ref 0.5–1.9)

## 2019-06-02 LAB — MAGNESIUM: Magnesium: 3.1 mg/dL — ABNORMAL HIGH (ref 1.7–2.4)

## 2019-06-02 MED ORDER — PERFLUTREN LIPID MICROSPHERE
1.0000 mL | INTRAVENOUS | Status: AC | PRN
  Administered 2019-06-02: 8 mL via INTRAVENOUS
  Filled 2019-06-02: qty 10

## 2019-06-02 MED ORDER — PHENYLEPHRINE HCL-NACL 10-0.9 MG/250ML-% IV SOLN: 0.0000 ug/min | INTRAVENOUS | Status: DC

## 2019-06-02 MED ORDER — CALCIUM GLUCONATE-NACL 1-0.675 GM/50ML-% IV SOLN
1.0000 g | Freq: Once | INTRAVENOUS | Status: AC
  Administered 2019-06-02: 1000 mg via INTRAVENOUS
  Filled 2019-06-02: qty 50

## 2019-06-02 MED ORDER — PANTOPRAZOLE SODIUM 40 MG PO PACK: 40.0000 mg | PACK | Freq: Every day | ORAL | Status: DC

## 2019-06-02 MED ORDER — FUROSEMIDE 10 MG/ML IJ SOLN: 40.0000 mg | Freq: Once | INTRAMUSCULAR | Status: AC

## 2019-06-02 MED ORDER — PHENYLEPHRINE HCL-NACL 10-0.9 MG/250ML-% IV SOLN
INTRAVENOUS | Status: AC
  Filled 2019-06-02: qty 250

## 2019-06-02 MED ORDER — METRONIDAZOLE IN NACL 5-0.79 MG/ML-% IV SOLN
500.0000 mg | Freq: Three times a day (TID) | INTRAVENOUS | Status: DC
  Administered 2019-06-02 – 2019-06-05 (×10): 500 mg via INTRAVENOUS
  Filled 2019-06-02 (×10): qty 100

## 2019-06-02 MED ORDER — PHENYLEPHRINE CONCENTRATED 100MG/250ML (0.4 MG/ML) INFUSION SIMPLE
0.0000 ug/min | INTRAVENOUS | Status: DC
  Administered 2019-06-02: 20 ug/min via INTRAVENOUS
  Filled 2019-06-02: qty 250

## 2019-06-02 MED ORDER — PHENYLEPHRINE HCL-NACL 10-0.9 MG/250ML-% IV SOLN: 25.0000 ug/min | INTRAVENOUS | Status: DC

## 2019-06-02 MED ORDER — FENTANYL CITRATE (PF) 100 MCG/2ML IJ SOLN
50.0000 ug | Freq: Once | INTRAMUSCULAR | Status: AC
  Administered 2019-06-02: 50 ug via INTRAVENOUS

## 2019-06-02 MED ORDER — PHENYLEPHRINE 40 MCG/ML (10ML) SYRINGE FOR IV PUSH (FOR BLOOD PRESSURE SUPPORT)
PREFILLED_SYRINGE | INTRAVENOUS | Status: DC | PRN
  Administered 2019-06-02 (×4): 80 ug via INTRAVENOUS

## 2019-06-02 MED ORDER — NOREPINEPHRINE 4 MG/250ML-% IV SOLN
0.0000 ug/min | INTRAVENOUS | Status: DC
  Administered 2019-06-02: 35 ug/min via INTRAVENOUS
  Administered 2019-06-02: 10 ug/min via INTRAVENOUS
  Administered 2019-06-02: 30 ug/min via INTRAVENOUS
  Administered 2019-06-02 (×2): 40 ug/min via INTRAVENOUS
  Administered 2019-06-03: 12 ug/min via INTRAVENOUS
  Administered 2019-06-03 (×2): 25 ug/min via INTRAVENOUS
  Administered 2019-06-03: 15 ug/min via INTRAVENOUS
  Filled 2019-06-02 (×9): qty 250

## 2019-06-02 MED ORDER — PROPOFOL 1000 MG/100ML IV EMUL
5.0000 ug/kg/min | INTRAVENOUS | Status: DC
  Administered 2019-06-02: 5 ug/kg/min via INTRAVENOUS
  Administered 2019-06-03 (×2): 20 ug/kg/min via INTRAVENOUS
  Filled 2019-06-02 (×3): qty 100

## 2019-06-02 MED ORDER — SUCCINYLCHOLINE CHLORIDE 20 MG/ML IJ SOLN
INTRAMUSCULAR | Status: DC | PRN
  Administered 2019-06-02: 180 mg via INTRAVENOUS

## 2019-06-02 MED ORDER — SODIUM CHLORIDE 0.9 % IV SOLN
2.0000 g | Freq: Two times a day (BID) | INTRAVENOUS | Status: DC
  Administered 2019-06-02 (×2): 2 g via INTRAVENOUS
  Filled 2019-06-02 (×4): qty 2

## 2019-06-02 MED ORDER — SODIUM CHLORIDE 0.45 % IV SOLN: INTRAVENOUS | Status: DC

## 2019-06-02 MED ORDER — PANTOPRAZOLE SODIUM 40 MG IV SOLR
40.0000 mg | Freq: Two times a day (BID) | INTRAVENOUS | Status: DC
  Administered 2019-06-02 (×2): 40 mg via INTRAVENOUS
  Filled 2019-06-02 (×2): qty 40

## 2019-06-02 MED ORDER — ORAL CARE MOUTH RINSE
15.0000 mL | OROMUCOSAL | Status: DC
  Administered 2019-06-02 – 2019-06-18 (×154): 15 mL via OROMUCOSAL

## 2019-06-02 MED ORDER — ALBUMIN HUMAN 5 % IV SOLN
INTRAVENOUS | Status: AC
  Administered 2019-06-02: 12.5 g
  Filled 2019-06-02: qty 500

## 2019-06-02 MED ORDER — POLYETHYLENE GLYCOL 3350 17 G PO PACK: 17.0000 g | PACK | Freq: Every day | ORAL | Status: DC

## 2019-06-02 MED ORDER — SODIUM CHLORIDE 0.9 % IV SOLN
250.0000 mL | INTRAVENOUS | Status: DC
  Administered 2019-06-04 – 2019-06-07 (×2): 250 mL via INTRAVENOUS

## 2019-06-02 MED ORDER — FENTANYL 2500MCG IN NS 250ML (10MCG/ML) PREMIX INFUSION
50.0000 ug/h | INTRAVENOUS | Status: DC
  Administered 2019-06-02: 50 ug/h via INTRAVENOUS
  Administered 2019-06-03 (×2): 200 ug/h via INTRAVENOUS
  Filled 2019-06-02 (×3): qty 250

## 2019-06-02 MED ORDER — DEXMEDETOMIDINE HCL IN NACL 400 MCG/100ML IV SOLN
0.0000 ug/kg/h | INTRAVENOUS | Status: AC
  Administered 2019-06-02: 1.2 ug/kg/h via INTRAVENOUS
  Administered 2019-06-02: 0.4 ug/kg/h via INTRAVENOUS
  Filled 2019-06-02: qty 200
  Filled 2019-06-02: qty 100

## 2019-06-02 MED ORDER — IOHEXOL 300 MG/ML  SOLN
80.0000 mL | Freq: Once | INTRAMUSCULAR | Status: AC | PRN
  Administered 2019-06-02: 80 mL via INTRAVENOUS

## 2019-06-02 MED ORDER — CLINDAMYCIN PHOSPHATE 600 MG/50ML IV SOLN
600.0000 mg | Freq: Three times a day (TID) | INTRAVENOUS | Status: DC
  Filled 2019-06-02: qty 50

## 2019-06-02 MED ORDER — ALBUMIN HUMAN 5 % IV SOLN
25.0000 g | Freq: Once | INTRAVENOUS | Status: AC
  Administered 2019-06-02 (×2): 12.5 g via INTRAVENOUS
  Filled 2019-06-02: qty 500

## 2019-06-02 MED ORDER — PHENYLEPHRINE 40 MCG/ML (10ML) SYRINGE FOR IV PUSH (FOR BLOOD PRESSURE SUPPORT)
PREFILLED_SYRINGE | INTRAVENOUS | Status: AC
  Filled 2019-06-02: qty 10

## 2019-06-02 MED ORDER — VASOPRESSIN 20 UNIT/ML IV SOLN
0.0300 [IU]/min | INTRAVENOUS | Status: DC
  Administered 2019-06-02 – 2019-06-17 (×12): 0.03 [IU]/min via INTRAVENOUS
  Filled 2019-06-02 (×19): qty 2

## 2019-06-02 MED ORDER — FUROSEMIDE 10 MG/ML IJ SOLN
INTRAMUSCULAR | Status: AC
  Administered 2019-06-02: 40 mg
  Filled 2019-06-02: qty 4

## 2019-06-02 MED ORDER — CLINDAMYCIN PHOSPHATE 600 MG/50ML IV SOLN
600.0000 mg | Freq: Once | INTRAVENOUS | Status: AC
  Administered 2019-06-02: 600 mg via INTRAVENOUS
  Filled 2019-06-02: qty 50

## 2019-06-02 MED ORDER — FREE WATER: 200.0000 mL | Freq: Four times a day (QID) | Status: DC

## 2019-06-02 MED ORDER — PANTOPRAZOLE SODIUM 40 MG PO PACK: 40.0000 mg | PACK | Freq: Two times a day (BID) | ORAL | Status: DC

## 2019-06-02 MED ORDER — ALBUMIN HUMAN 5 % IV SOLN
25.0000 g | Freq: Once | INTRAVENOUS | Status: AC
  Administered 2019-06-02: 12.5 g via INTRAVENOUS

## 2019-06-02 MED ORDER — FENTANYL BOLUS VIA INFUSION
50.0000 ug | INTRAVENOUS | Status: DC | PRN
  Filled 2019-06-02: qty 50

## 2019-06-02 MED ORDER — PHENYLEPHRINE HCL-NACL 10-0.9 MG/250ML-% IV SOLN
INTRAVENOUS | Status: AC
  Administered 2019-06-02: 20 ug/min
  Filled 2019-06-02: qty 250

## 2019-06-02 MED ORDER — DOCUSATE SODIUM 50 MG/5ML PO LIQD: 100.0000 mg | Freq: Two times a day (BID) | ORAL | Status: DC

## 2019-06-02 MED ORDER — CHLORHEXIDINE GLUCONATE 0.12% ORAL RINSE (MEDLINE KIT)
15.0000 mL | Freq: Two times a day (BID) | OROMUCOSAL | Status: DC
  Administered 2019-06-02 – 2019-06-18 (×32): 15 mL via OROMUCOSAL

## 2019-06-02 MED ORDER — SODIUM CHLORIDE 0.9 % IV BOLUS
1000.0000 mL | Freq: Once | INTRAVENOUS | Status: AC
  Administered 2019-06-02: 1000 mL via INTRAVENOUS

## 2019-06-02 NOTE — Progress Notes (Signed)
Patient ID: Ian Velasquez, male   DOB: May 25, 1955, 64 y.o.   MRN: [id number] U/O low and lactate unchanged 1L 0.9 NS Concerned about AKI BP better on pressors  Violeta Gelinas, MD, MPH, FACS Please use [email or web address] to contact on call provider

## 2019-06-02 NOTE — Anesthesia Procedure Notes (Signed)
Procedure Name: Intubation Date/Time: 06/02/2019 8:40 AM Performed by: Sonda Primes, CRNA Pre-anesthesia Checklist: Patient identified, Emergency Drugs available, Suction available, Patient being monitored and Timeout performed Patient Re-evaluated:Patient Re-evaluated prior to induction Oxygen Delivery Method: Ambu bag Preoxygenation: Pre-oxygenation with 100% oxygen Induction Type: IV induction, Rapid sequence and Cricoid Pressure applied Ventilation: Mask ventilation without difficulty Laryngoscope Size: Glidescope and 3 Grade View: Grade III Tube type: Subglottic suction tube Tube size: 8.0 mm Number of attempts: 1 Airway Equipment and Method: Stylet and Video-laryngoscopy Placement Confirmation: ETT inserted through vocal cords under direct vision,  breath sounds checked- equal and bilateral and CO2 detector Secured at: 25 cm Tube secured with: Tape Dental Injury: Teeth and Oropharynx as per pre-operative assessment  Comments: After succinylcholine, patient began immediately vomiting brown secrections, cricoid pressure applied. As we let off pressure, more vomiting occurred. Cords visualized, ETT secured at 25 cm. Suctioned ETT, placed on vent per RT. OGT placed for return 600 mL dark brown emesis. A-line placed per Dr. Maple Hudson, Central line per Dr. Janee Morn, Phenylephrine gtt started per ICU staff prior to our departure. O2 sat Increasing to 90's with continued ventilation.

## 2019-06-02 NOTE — Addendum Note (Signed)
Addendum  created 06/02/19 1059 by Val Eagle, MD   Attestation recorded in Intraprocedure, Child order released for a procedure order, Clinical Note Signed, Intraprocedure Attestations filed, Intraprocedure Blocks edited, LDA created via procedure documentation

## 2019-06-02 NOTE — Progress Notes (Signed)
  Echocardiogram 2D Echocardiogram limited with definity has been performed.  Leta Jungling M 06/02/2019, 11:04 AM

## 2019-06-02 NOTE — Progress Notes (Signed)
Patient ID: Ian Velasquez, male   DOB: 1955/08/31, 64 y.o.   MRN: [id number] Follow up - Trauma Critical Care  Patient Details:    Ian Velasquez is an 64 y.o. male.  Lines/tubes : Urethral Catheter Gailen Shelter RN 16 Fr. (Active)  Indication for Insertion or Continuance of Catheter Therapy based on hourly urine output monitoring and documentation for critical condition (NOT STRICT I&O) 06/02/19 0300  Site Assessment Clean;Intact 06/02/19 0300  Catheter Maintenance Bag below level of bladder;Catheter secured;Drainage bag/tubing not touching floor;Insertion date on drainage bag;No dependent loops;Seal intact 06/02/19 0300  Collection Container Standard drainage bag 06/02/19 0300  Securement Method Securing device (Describe) 06/02/19 0300  Output (mL) 165 mL 06/02/19 0600    Microbiology/Sepsis markers: Results for orders placed or performed during the hospital encounter of 06/16/2019  Respiratory Panel by RT PCR (Flu A&B, Covid) - Nasopharyngeal Swab     Status: None   Collection Time: Jun 16, 2019  5:23 PM   Specimen: Nasopharyngeal Swab  Result Value Ref Range Status   SARS Coronavirus 2 by RT PCR NEGATIVE NEGATIVE Final    Comment: (NOTE) SARS-CoV-2 target nucleic acids are NOT DETECTED. The SARS-CoV-2 RNA is generally detectable in upper respiratoy specimens during the acute phase of infection. The lowest concentration of SARS-CoV-2 viral copies this assay can detect is 131 copies/mL. A negative result does not preclude SARS-Cov-2 infection and should not be used as the sole basis for treatment or other patient management decisions. A negative result may occur with  improper specimen collection/handling, submission of specimen other than nasopharyngeal swab, presence of viral mutation(s) within the areas targeted by this assay, and inadequate number of viral copies (<131 copies/mL). A negative result must be combined with clinical observations, patient history, and epidemiological  information. The expected result is Negative. Fact Sheet for Patients:  PinkCheek.be Fact Sheet for Healthcare Providers:  GravelBags.it This test is not yet ap proved or cleared by the [place] FDA and  has been authorized for detection and/or diagnosis of SARS-CoV-2 by FDA under an Emergency Use Authorization (EUA). This EUA will remain  in effect (meaning this test can be used) for the duration of the COVID-19 declaration under Section 564(b)(1) of the Act, 21 U.S.C. section 360bbb-3(b)(1), unless the authorization is terminated or revoked sooner.    Influenza A by PCR NEGATIVE NEGATIVE Final   Influenza B by PCR NEGATIVE NEGATIVE Final    Comment: (NOTE) The Xpert Xpress SARS-CoV-2/FLU/RSV assay is intended as an aid in  the diagnosis of influenza from Nasopharyngeal swab specimens and  should not be used as a sole basis for treatment. Nasal washings and  aspirates are unacceptable for Xpert Xpress SARS-CoV-2/FLU/RSV  testing. Fact Sheet for Patients: PinkCheek.be Fact Sheet for Healthcare Providers: GravelBags.it This test is not yet approved or cleared by the [place] FDA and  has been authorized for detection and/or diagnosis of SARS-CoV-2 by  FDA under an Emergency Use Authorization (EUA). This EUA will remain  in effect (meaning this test can be used) for the duration of the  Covid-19 declaration under Section 564(b)(1) of the Act, 21  U.S.C. section 360bbb-3(b)(1), unless the authorization is  terminated or revoked. Performed at [facility], [place] [address], [place], [place] [address]   MRSA PCR Screening     Status: None   Collection Time: Jun 16, 2019  8:16 PM   Specimen: Nasopharyngeal  Result Value Ref Range Status   MRSA by PCR NEGATIVE NEGATIVE Final    Comment:  The GeneXpert MRSA Assay (FDA approved for NASAL specimens only),  is one component of a comprehensive MRSA colonization surveillance program. It is not intended to diagnose MRSA infection nor to guide or monitor treatment for MRSA infections. Performed at [facility], [address], [place], [place] [address]   Culture, Urine     Status: Abnormal   Collection Time: 05/30/19  8:44 AM   Specimen: Urine, Clean Catch  Result Value Ref Range Status   Specimen Description URINE, CLEAN CATCH  Final   Special Requests NONE  Final   Culture (A)  Final    <10,000 COLONIES/mL INSIGNIFICANT GROWTH Performed at [facility], [address] [place], [place] [address]    Report Status 05/31/2019 FINAL  Final  Culture, blood (routine x 2)     Status: Abnormal   Collection Time: 05/30/19  9:50 AM   Specimen: BLOOD  Result Value Ref Range Status   Specimen Description BLOOD LEFT ANTECUBITAL  Final   Special Requests   Final    BOTTLES DRAWN AEROBIC ONLY Blood Culture results may not be optimal due to an inadequate volume of blood received in culture bottles   Culture  Setup Time   Final    GRAM POSITIVE COCCI IN CLUSTERS AEROBIC BOTTLE ONLY CRITICAL RESULT CALLED TO, READ BACK BY AND VERIFIED WITH: Melven Sartor[facility] 05/31/19 0013 JDW Performed at [facility], [address], [place], [place] [address]    Culture STAPHYLOCOCCUS AUREUS (A)  Final   Report Status 06/01/2019 FINAL  Final   Organism ID, Bacteria STAPHYLOCOCCUS AUREUS  Final      Susceptibility   Staphylococcus aureus - MIC*    CIPROFLOXACIN <=0.5 SENSITIVE Sensitive     ERYTHROMYCIN <=0.25 SENSITIVE Sensitive     GENTAMICIN <=0.5 SENSITIVE Sensitive     OXACILLIN 0.5 SENSITIVE Sensitive     TETRACYCLINE <=1 SENSITIVE Sensitive     VANCOMYCIN <=0.5 SENSITIVE Sensitive     TRIMETH/SULFA <=10 SENSITIVE Sensitive     CLINDAMYCIN <=0.25 SENSITIVE Sensitive     RIFAMPIN <=0.5 SENSITIVE Sensitive     Inducible Clindamycin NEGATIVE Sensitive     * STAPHYLOCOCCUS AUREUS  Blood  Culture ID Panel (Reflexed)     Status: Abnormal   Collection Time: 05/30/19  9:50 AM  Result Value Ref Range Status   Enterococcus species NOT DETECTED NOT DETECTED Final   Listeria monocytogenes NOT DETECTED NOT DETECTED Final   Staphylococcus species DETECTED (A) NOT DETECTED Final    Comment: CRITICAL RESULT CALLED TO, READ BACK BY AND VERIFIED WITH: J LEDFORD PHARMD 05/31/19 0013 JDW    Staphylococcus aureus (BCID) DETECTED (A) NOT DETECTED Final    Comment: Methicillin (oxacillin) susceptible Staphylococcus aureus (MSSA). Preferred therapy is anti staphylococcal beta lactam antibiotic (Cefazolin or Nafcillin), unless clinically contraindicated. CRITICAL RESULT CALLED TO, READ BACK BY AND VERIFIED WITH: J LEDFORD [facility] 05/31/19 0013 JDW    Methicillin resistance NOT DETECTED NOT DETECTED Final   Streptococcus species NOT DETECTED NOT DETECTED Final   Streptococcus agalactiae NOT DETECTED NOT DETECTED Final   Streptococcus pneumoniae NOT DETECTED NOT DETECTED Final   Streptococcus pyogenes NOT DETECTED NOT DETECTED Final   Acinetobacter baumannii NOT DETECTED NOT DETECTED Final   Enterobacteriaceae species NOT DETECTED NOT DETECTED Final   Enterobacter cloacae complex NOT DETECTED NOT DETECTED Final   Escherichia coli NOT DETECTED NOT DETECTED Final   Klebsiella oxytoca NOT DETECTED NOT DETECTED Final   Klebsiella pneumoniae NOT DETECTED NOT  DETECTED Final   Proteus species NOT DETECTED NOT DETECTED Final   Serratia marcescens NOT DETECTED NOT DETECTED Final   Haemophilus influenzae NOT DETECTED NOT DETECTED Final   Neisseria meningitidis NOT DETECTED NOT DETECTED Final   Pseudomonas aeruginosa NOT DETECTED NOT DETECTED Final   Candida albicans NOT DETECTED NOT DETECTED Final   Candida glabrata NOT DETECTED NOT DETECTED Final   Candida krusei NOT DETECTED NOT DETECTED Final   Candida parapsilosis NOT DETECTED NOT DETECTED Final   Candida tropicalis NOT DETECTED NOT DETECTED Final     Comment: Performed at [facility], [address] [place], [place] [address]  Culture, blood (routine x 2)     Status: Abnormal   Collection Time: 05/30/19  9:55 AM   Specimen: BLOOD  Result Value Ref Range Status   Specimen Description BLOOD LEFT ANTECUBITAL  Final   Special Requests   Final    BOTTLES DRAWN AEROBIC ONLY Blood Culture results may not be optimal due to an inadequate volume of blood received in culture bottles   Culture  Setup Time   Final    AEROBIC BOTTLE ONLY GRAM POSITIVE COCCI IN CLUSTERS CRITICAL VALUE NOTED.  VALUE IS CONSISTENT WITH PREVIOUSLY REPORTED AND CALLED VALUE.    Culture (A)  Final    STAPHYLOCOCCUS AUREUS SUSCEPTIBILITIES PERFORMED ON PREVIOUS CULTURE WITHIN THE LAST 5 DAYS. Performed at [facility], [address] [place], [place] [address]    Report Status 06/01/2019 FINAL  Final  Culture, blood (routine x 2)     Status: None (Preliminary result)   Collection Time: 05/31/19  7:12 AM   Specimen: BLOOD RIGHT HAND  Result Value Ref Range Status   Specimen Description BLOOD RIGHT HAND  Final   Special Requests   Final    BOTTLES DRAWN AEROBIC ONLY Blood Culture results may not be optimal due to an inadequate volume of blood received in culture bottles   Culture   Final    NO GROWTH 1 DAY Performed at [facility], [address] [place], [place] [address]    Report Status PENDING  Incomplete  Culture, blood (routine x 2)     Status: None (Preliminary result)   Collection Time: 05/31/19  7:45 AM   Specimen: BLOOD LEFT ARM  Result Value Ref Range Status   Specimen Description BLOOD LEFT ARM  Final   Special Requests   Final    BOTTLES DRAWN AEROBIC ONLY Blood Culture results may not be optimal due to an inadequate volume of blood received in culture bottles   Culture   Final    NO GROWTH 1 DAY Performed at [facility], [address] [place], [place] [address]    Report Status PENDING  Incomplete     Anti-infectives:  Anti-infectives (From admission, onward)   Start     Dose/Rate Route Frequency Ordered Stop   06/02/19 1000  clindamycin (CLEOCIN) IVPB 600 mg     600 mg 100 mL/hr over 30 Minutes Intravenous Every 8 hours 06/02/19 0142     06/02/19 0200  clindamycin (CLEOCIN) IVPB 600 mg     600 mg 100 mL/hr over 30 Minutes Intravenous  Once 06/02/19 0142 06/02/19 0254   05/31/19 1330  nafcillin 12 g in sodium chloride 0.9 % 500 mL continuous infusion     12 g 20.8 mL/hr over 24 Hours Intravenous Every 24 hours 05/31/19 1052     05/31/19 0200  nafcillin 2 g in  sodium chloride 0.9 % 100 mL IVPB  Status:  Discontinued     2 g 200 mL/hr over 30 Minutes Intravenous Every 4 hours 05/31/19 0055 05/31/19 1052   05/31/19 0115  nafcillin injection 2 g  Status:  Discontinued     2 g Intravenous Every 4 hours 05/31/19 0027 05/31/19 0054      Best Practice/Protocols:  VTE Prophylaxis: Lovenox (prophylaxtic dose) .  Consults: Treatment Team:  Jadene Pierini, MD Yolonda Kida, MD    Subjective:    Overnight Issues:  resp distress, transferred to ICU Objective:  Vital signs for last 24 hours: Temp:  [98 F (36.7 C)-99.6 F (37.6 C)] 98.8 F (37.1 C) (04/29 0400) Pulse Rate:  [93-136] 132 (04/29 0700) Resp:  [24-41] 35 (04/29 0700) BP: (119-133)/(55-109) 122/75 (04/29 0700) SpO2:  [90 %-100 %] 91 % (04/29 0700)  Hemodynamic parameters for last 24 hours:    Intake/Output from previous day: 04/28 0701 - 04/29 0700 In: 2523.8 [P.O.:240; I.V.:1694.4; IV Piggyback:589.4] Out: 1390 [Urine:1390]  Intake/Output this shift: No intake/output data recorded.  Vent settings for last 24 hours:    Physical Exam:  General: resp distress Neuro: arouses and F/C HEENT/Neck: cortrak Resp: severe rhonchi CVS: tachy 130 GI: quite distended, quiet, not tender Extremities: edema 2+ and large posterior LE contusions  Results for orders placed or performed during the  hospital encounter of 05/28/2019 (from the past 24 hour(s))  CBC     Status: Abnormal   Collection Time: 06/02/19  3:28 AM  Result Value Ref Range   WBC 8.0 4.0 - 10.5 K/uL   RBC 3.77 (L) 4.22 - 5.81 MIL/uL   Hemoglobin 11.0 (L) 13.0 - 17.0 g/dL   HCT 01.7 (L) 51.0 - 25.8 %   MCV 95.0 80.0 - 100.0 fL   MCH 29.2 26.0 - 34.0 pg   MCHC 30.7 30.0 - 36.0 g/dL   RDW 52.7 (H) 78.2 - 42.3 %   Platelets 210 150 - 400 K/uL   nRBC 1.5 (H) 0.0 - 0.2 %  Basic metabolic panel     Status: Abnormal   Collection Time: 06/02/19  3:28 AM  Result Value Ref Range   Sodium 155 (H) 135 - 145 mmol/L   Potassium 3.3 (L) 3.5 - 5.1 mmol/L   Chloride 116 (H) 98 - 111 mmol/L   CO2 22 22 - 32 mmol/L   Glucose, Bld 162 (H) 70 - 99 mg/dL   BUN 50 (H) 8 - 23 mg/dL   Creatinine, Ser 5.36 (H) 0.61 - 1.24 mg/dL   Calcium 8.3 (L) 8.9 - 10.3 mg/dL   GFR calc non Af Amer 39 (L) >60 mL/min   GFR calc Af Amer 45 (L) >60 mL/min   Anion gap 17 (H) 5 - 15  Magnesium     Status: Abnormal   Collection Time: 06/02/19  3:28 AM  Result Value Ref Range   Magnesium 3.1 (H) 1.7 - 2.4 mg/dL    Assessment & Plan: Present on Admission: **None**    LOS: 9 days   Additional comments:I reviewed the patient's new clinical lab test results. and xrays [facility] Acute hypoxic respiratory failure - likely due to aspiration. Intubation now and full support GI - looks like significant ileus with massive gastric dilatation and emesis. OGT to LIWS, hold TF. Also portal venous gas on xray ? etiology. CT abdomen was done 4/26 but may need to repeat and include pelvis SAH- NSGY c/s (Dr. Russella Dar head4/21, Keppra x7d for  sz ppx,EEG negative and neuro examimproved Concussion- TBI team therapies Comminuted left scapula fx- slingfor now, orthoc/s (Dr. 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 ECHO ok, they are considering brain MRI. Repeat Bcx pending. Now with aspiration got clinda overnight. I D/W Dr Ainsley Spinner and will switch to cefepime/Flagyl Gluteal and possible latissimus dorsi hematoma - possibly the cause of bacteremia. Continue abx. Malnutrition - prealbumin 10.3 FEN -add free water for hypernatremia, hold TF for severe ileus DVT - SCDs,LMWH Dispo -intubation, ICU I called his wife and updated her. Critical Care Total Time*: 1 Hour 20 Minutes  Violeta Gelinas, MD, MPH, FACS Trauma & General Surgery Use [email or web address] to contact on call provider  06/02/2019  *Care during the described time interval was provided by me. I have reviewed this patient's available data, including medical history, events of note, physical examination and test results as part of my evaluation.

## 2019-06-02 NOTE — Progress Notes (Signed)
Pt was transported to CT scan and back without complications.  
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
## 2019-06-02 NOTE — Progress Notes (Signed)
Approximately 2330, pt began experiencing respiratory distress, including congested breathing and accessory muscle use. RN started [employer] treatment and called respiratory at 2346. Pt placed on 2L [place] with O2 sat 93%. RRT called at 2357. Assessment included right-sided wheezing, and little air movement, especially on the left-side, persisting even after the breathing treatment. CXR ordered and RT and RRT performed NTS x3, with a large amount of bile suctioned out. On-call MD notified. Order for 40mg  Lasix received. No urine output, order for foley catheter received and MD came to bedside to assess pt. Order received to transfer pt to 4N ICU room 26 for closer monitoring.

## 2019-06-02 NOTE — Progress Notes (Signed)
OT Cancellation Note  Patient Details Name: Ian Velasquez MRN: [id number] DOB: 1955-07-19   Cancelled Treatment:    Reason Eval/Treat Not Completed: Patient declined, no reason specified(Labored breathing. Transfer to ICU with respiratory failure and possible aspiration. Reintubated. Will return as schedule allows.)  Tamyra Fojtik M Kate Larock Nyeisha Goodall MSOT, OTR/L Acute Rehab Pager: [phone] Office: [phone] 06/02/2019, 9:13 AM

## 2019-06-02 NOTE — Significant Event (Addendum)
Rapid Response Event Note  Overview: Called d/t resp distress, RR-40s and decreasing SpO2 t/o night(88% on RA).   Initial Focused Assessment: Pt laying in bed with eyes closed, will answer questions appropriately and follow commands. Pt with +WOB, +accessory muscles use. Lungs with little air movement (less air movement in L vs R), R sided wheezes, congested breathing, weak cough.  T-99.6, HR-128, BP-111/68, RR-40s, SpO2-93% on 2L [place].   Interventions: Duoneb given PTA RRT PCXR-Mild, stable left basilar and left suprahilar atelectasis and/or infiltrate. NTS'd x 2-large amount bile suctioned out 40mg  lasix IV x 1  Plan of Care (if not transferred): Give lasix and monitor response. Bipap not an option-pt with weak cough, congested breathing, and cortrac tube. Call MD and  RRT if pt doesn't improve, worsens, or if further assistance needed.   0200-Dr. Kinsinger notified by bedside RN that pt still in resp distress, RR-upper 30s-40s, congested breathing, and is now more confused. I am worried about his airway as he will not cough on command, has congested breathing, and has a very weak cough when deep oral suctioning done. Dr. came to bedside to assess pt. Will transfer to 4N ICU for closer monitoring.   Event Summary:  Kinsinger, MD notified at 0044 Called: 2357 Arrived: 60 Ended: 0056  120

## 2019-06-02 NOTE — Anesthesia Procedure Notes (Signed)
Arterial Line Insertion Start/End4/29/2021 9:11 AM, 06/02/2019 9:21 AM Performed by: Val Eagle, MD, anesthesiologist  Patient location: ICU. Preanesthetic checklist: patient identified, IV checked, site marked, risks and benefits discussed, surgical consent, monitors and equipment checked, pre-op evaluation, timeout performed and anesthesia consent Right, radial was placed Catheter size: 20 G Hand hygiene performed  and maximum sterile barriers used   Attempts: 1 Procedure performed without using ultrasound guided technique. Following insertion, dressing applied and Biopatch. Post procedure assessment: normal and unchanged
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
## 2019-06-02 NOTE — Procedures (Signed)
Central Venous Catheter Insertion Procedure Note Ian Velasquez [id number] 07/29/1955  Procedure: Insertion of Central Venous Catheter Indications: Drug and/or fluid administration  Procedure Details Consent: Unable to obtain consent because of emergent medical necessity. Time Out: Verified patient identification, verified procedure, site/side was marked, verified correct patient position, special equipment/implants available, medications/allergies/relevent history reviewed, required imaging and test results available.  Performed  Maximum sterile technique was used including antiseptics, cap, gloves, gown, hand hygiene, mask and sheet. Skin prep: Chlorhexidine; local anesthetic administered A antimicrobial bonded/coated triple lumen catheter was placed in the left subclavian vein using the Seldinger technique.  Evaluation Blood flow good Complications: No apparent complications Patient did tolerate procedure well. Chest X-ray ordered to verify placement.  CXR: pending.  Liz Malady 06/02/2019, 9:04 AM
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
## 2019-06-02 NOTE — Progress Notes (Signed)
Patient ID: Ian Velasquez, male   DOB: 1955-08-15, 64 y.o.   MRN: [id number] Started Neo for BP support. CXR and abd xray pending. EKG with ST elevation but not consistent across leads. I reviewed with CCM MD and suspect artifact. Also looks concerning on monitor so I will consult cardiology.  Violeta Gelinas, MD, MPH, FACS Please use [email or web address] to contact on call provider

## 2019-06-02 NOTE — Progress Notes (Signed)
Dr. Sheliah Hatch notified patient tachypnic, labored, and very junky sounding. New orders received to NT suction. Will continue to monitor.
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
## 2019-06-02 NOTE — Progress Notes (Signed)
PT Cancellation Note  Patient Details Name: Ian Velasquez MRN: [id number] DOB: [birth year]   Cancelled Treatment:    Reason Eval/Treat Not Completed: Medical issues which prohibited therapy.  Medical decline.  PT to hold for now.  Will follow for medical stability.  See also OT note.   Thanks,  Corinna Capra, PT, DPT  Acute Rehabilitation [phone] pager #[phone] office       [name] 06/02/2019, 9:56 AM

## 2019-06-02 NOTE — Progress Notes (Signed)
Patient ID: [name], male   DOB: [birth year], 64 y.o.   MRN: [id number] Suspect sepsis from aspiration PNA. Appreciate ID F/U. Add vasopressin. Albumin bolus going. Will re-check lactate 3h after previous.  Violeta Gelinas, MD, MPH, FACS Please use [email or web address] to contact on call provider

## 2019-06-02 NOTE — Progress Notes (Signed)
Attempted to NT suction patient. Minimal secretions suctioned. Patient vomited. This RN suctioned via nasal route and oral. Dr. Sheliah Hatch updated about patient's respiratory status and potential aspiration.

## 2019-06-03 ENCOUNTER — Inpatient Hospital Stay (HOSPITAL_COMMUNITY): Payer: PRIVATE HEALTH INSURANCE

## 2019-06-03 DIAGNOSIS — S42102A Fracture of unspecified part of scapula, left shoulder, initial encounter for closed fracture: Secondary | ICD-10-CM | POA: Diagnosis present

## 2019-06-03 DIAGNOSIS — R9431 Abnormal electrocardiogram [ECG] [EKG]: Secondary | ICD-10-CM

## 2019-06-03 DIAGNOSIS — T17908A Unspecified foreign body in respiratory tract, part unspecified causing other injury, initial encounter: Secondary | ICD-10-CM | POA: Diagnosis not present

## 2019-06-03 DIAGNOSIS — A419 Sepsis, unspecified organism: Secondary | ICD-10-CM | POA: Diagnosis not present

## 2019-06-03 DIAGNOSIS — R140 Abdominal distension (gaseous): Secondary | ICD-10-CM | POA: Diagnosis present

## 2019-06-03 DIAGNOSIS — R0603 Acute respiratory distress: Secondary | ICD-10-CM | POA: Diagnosis not present

## 2019-06-03 DIAGNOSIS — S069X9D Unspecified intracranial injury with loss of consciousness of unspecified duration, subsequent encounter: Secondary | ICD-10-CM

## 2019-06-03 DIAGNOSIS — T17908D Unspecified foreign body in respiratory tract, part unspecified causing other injury, subsequent encounter: Secondary | ICD-10-CM

## 2019-06-03 DIAGNOSIS — S42152P Displaced fracture of neck of scapula, left shoulder, subsequent encounter for fracture with malunion: Secondary | ICD-10-CM

## 2019-06-03 LAB — POCT I-STAT 7, (LYTES, BLD GAS, ICA,H+H)
Acid-base deficit: 4 mmol/L — ABNORMAL HIGH (ref 0.0–2.0)
Acid-base deficit: 4 mmol/L — ABNORMAL HIGH (ref 0.0–2.0)
Acid-base deficit: 5 mmol/L — ABNORMAL HIGH (ref 0.0–2.0)
Bicarbonate: 22.1 mmol/L (ref 20.0–28.0)
Bicarbonate: 22.9 mmol/L (ref 20.0–28.0)
Bicarbonate: 23.1 mmol/L (ref 20.0–28.0)
Calcium, Ion: 0.96 mmol/L — ABNORMAL LOW (ref 1.15–1.40)
Calcium, Ion: 0.98 mmol/L — ABNORMAL LOW (ref 1.15–1.40)
Calcium, Ion: 0.97 mmol/L — ABNORMAL LOW (ref 1.15–1.40)
HCT: 23 % — ABNORMAL LOW (ref 39.0–52.0)
HCT: 25 % — ABNORMAL LOW (ref 39.0–52.0)
HCT: 26 % — ABNORMAL LOW (ref 39.0–52.0)
Hemoglobin: 7.8 g/dL — ABNORMAL LOW (ref 13.0–17.0)
Hemoglobin: 8.5 g/dL — ABNORMAL LOW (ref 13.0–17.0)
Hemoglobin: 8.8 g/dL — ABNORMAL LOW (ref 13.0–17.0)
O2 Saturation: 96 %
O2 Saturation: 87 %
O2 Saturation: 91 %
Patient temperature: 97.8
Patient temperature: 98.4
Potassium: 4 mmol/L (ref 3.5–5.1)
Potassium: 4.1 mmol/L (ref 3.5–5.1)
Potassium: 4.6 mmol/L (ref 3.5–5.1)
Sodium: 153 mmol/L — ABNORMAL HIGH (ref 135–145)
Sodium: 152 mmol/L — ABNORMAL HIGH (ref 135–145)
Sodium: 150 mmol/L — ABNORMAL HIGH (ref 135–145)
TCO2: 23 mmol/L (ref 22–32)
TCO2: 25 mmol/L (ref 22–32)
TCO2: 25 mmol/L (ref 22–32)
pCO2 arterial: 42.4 mmHg (ref 32.0–48.0)
pCO2 arterial: 53.1 mmHg — ABNORMAL HIGH (ref 32.0–48.0)
pCO2 arterial: 58.5 mmHg — ABNORMAL HIGH (ref 32.0–48.0)
pH, Arterial: 7.322 — ABNORMAL LOW (ref 7.350–7.450)
pH, Arterial: 7.243 — ABNORMAL LOW (ref 7.350–7.450)
pH, Arterial: 7.205 — ABNORMAL LOW (ref 7.350–7.450)
pO2, Arterial: 87 mmHg (ref 83.0–108.0)
pO2, Arterial: 63 mmHg — ABNORMAL LOW (ref 83.0–108.0)
pO2, Arterial: 76 mmHg — ABNORMAL LOW (ref 83.0–108.0)

## 2019-06-03 LAB — RENAL FUNCTION PANEL
Albumin: 2 g/dL — ABNORMAL LOW (ref 3.5–5.0)
Anion gap: 16 — ABNORMAL HIGH (ref 5–15)
BUN: 110 mg/dL — ABNORMAL HIGH (ref 8–23)
CO2: 22 mmol/L (ref 22–32)
Calcium: 7.1 mg/dL — ABNORMAL LOW (ref 8.9–10.3)
Chloride: 115 mmol/L — ABNORMAL HIGH (ref 98–111)
Creatinine, Ser: 5.44 mg/dL — ABNORMAL HIGH (ref 0.61–1.24)
GFR calc Af Amer: 12 mL/min — ABNORMAL LOW (ref >60)
GFR calc non Af Amer: 10 mL/min — ABNORMAL LOW (ref >60)
Glucose, Bld: 139 mg/dL — ABNORMAL HIGH (ref 70–99)
Phosphorus: 8.5 mg/dL — ABNORMAL HIGH (ref 2.5–4.6)
Potassium: 4.7 mmol/L (ref 3.5–5.1)
Sodium: 153 mmol/L — ABNORMAL HIGH (ref 135–145)

## 2019-06-03 LAB — CBC
HCT: 27.3 % — ABNORMAL LOW (ref 39.0–52.0)
Hemoglobin: 8.3 g/dL — ABNORMAL LOW (ref 13.0–17.0)
MCH: 29.1 pg (ref 26.0–34.0)
MCHC: 30.4 g/dL (ref 30.0–36.0)
MCV: 95.8 fL (ref 80.0–100.0)
Platelets: 182 10*3/uL (ref 150–400)
RBC: 2.85 MIL/uL — ABNORMAL LOW (ref 4.22–5.81)
RDW: 17.8 % — ABNORMAL HIGH (ref 11.5–15.5)
WBC: 6.3 10*3/uL (ref 4.0–10.5)
nRBC: 2.9 % — ABNORMAL HIGH (ref 0.0–0.2)

## 2019-06-03 LAB — BASIC METABOLIC PANEL
Anion gap: 15 (ref 5–15)
BUN: 87 mg/dL — ABNORMAL HIGH (ref 8–23)
CO2: 20 mmol/L — ABNORMAL LOW (ref 22–32)
Calcium: 6.9 mg/dL — ABNORMAL LOW (ref 8.9–10.3)
Chloride: 116 mmol/L — ABNORMAL HIGH (ref 98–111)
Creatinine, Ser: 4.68 mg/dL — ABNORMAL HIGH (ref 0.61–1.24)
GFR calc Af Amer: 14 mL/min — ABNORMAL LOW (ref >60)
GFR calc non Af Amer: 12 mL/min — ABNORMAL LOW (ref >60)
Glucose, Bld: 189 mg/dL — ABNORMAL HIGH (ref 70–99)
Potassium: 4 mmol/L (ref 3.5–5.1)
Sodium: 151 mmol/L — ABNORMAL HIGH (ref 135–145)

## 2019-06-03 LAB — TRIGLYCERIDES: Triglycerides: 592 mg/dL — ABNORMAL HIGH (ref <150)

## 2019-06-03 LAB — GLUCOSE, CAPILLARY
Glucose-Capillary: 123 mg/dL — ABNORMAL HIGH (ref 70–99)
Glucose-Capillary: 126 mg/dL — ABNORMAL HIGH (ref 70–99)
Glucose-Capillary: 127 mg/dL — ABNORMAL HIGH (ref 70–99)
Glucose-Capillary: 140 mg/dL — ABNORMAL HIGH (ref 70–99)
Glucose-Capillary: 154 mg/dL — ABNORMAL HIGH (ref 70–99)

## 2019-06-03 LAB — BILIRUBIN, FRACTIONATED(TOT/DIR/INDIR)
Bilirubin, Direct: 9.4 mg/dL — ABNORMAL HIGH (ref 0.0–0.2)
Indirect Bilirubin: 6.7 mg/dL — ABNORMAL HIGH (ref 0.3–0.9)
Total Bilirubin: 16.1 mg/dL — ABNORMAL HIGH (ref 0.3–1.2)

## 2019-06-03 MED ORDER — HEPARIN SODIUM (PORCINE) 5000 UNIT/ML IJ SOLN
5000.0000 [IU] | Freq: Three times a day (TID) | INTRAMUSCULAR | Status: DC
  Administered 2019-06-04 – 2019-06-13 (×26): 5000 [IU] via SUBCUTANEOUS
  Filled 2019-06-03 (×25): qty 1

## 2019-06-03 MED ORDER — PANTOPRAZOLE SODIUM 40 MG IV SOLR
40.0000 mg | INTRAVENOUS | Status: DC
  Administered 2019-06-03 – 2019-06-12 (×10): 40 mg via INTRAVENOUS
  Filled 2019-06-03 (×10): qty 40

## 2019-06-03 MED ORDER — SODIUM CHLORIDE 0.9 % IV SOLN
2.0000 g | Freq: Two times a day (BID) | INTRAVENOUS | Status: DC
  Administered 2019-06-03 – 2019-06-05 (×4): 2 g via INTRAVENOUS
  Filled 2019-06-03 (×5): qty 2

## 2019-06-03 MED ORDER — SODIUM CHLORIDE 0.9% FLUSH
10.0000 mL | INTRAVENOUS | Status: DC | PRN
  Administered 2019-06-10: 10 mL

## 2019-06-03 MED ORDER — SODIUM CHLORIDE 0.9 % IV BOLUS
1000.0000 mL | Freq: Once | INTRAVENOUS | Status: AC
  Administered 2019-06-03: 1000 mL via INTRAVENOUS

## 2019-06-03 MED ORDER — PRISMASOL BGK 4/2.5 32-4-2.5 MEQ/L REPLACEMENT SOLN
Status: DC
  Filled 2019-06-03 (×22): qty 5000

## 2019-06-03 MED ORDER — PRISMASOL BGK 4/2.5 32-4-2.5 MEQ/L IV SOLN
INTRAVENOUS | Status: DC
  Filled 2019-06-03 (×142): qty 5000

## 2019-06-03 MED ORDER — HYDROCORTISONE NA SUCCINATE PF 100 MG IJ SOLR
100.0000 mg | Freq: Three times a day (TID) | INTRAMUSCULAR | Status: DC
  Administered 2019-06-03 – 2019-06-06 (×9): 100 mg via INTRAVENOUS
  Filled 2019-06-03 (×9): qty 2

## 2019-06-03 MED ORDER — FENTANYL CITRATE (PF) 100 MCG/2ML IJ SOLN: 50.0000 ug | Freq: Once | INTRAMUSCULAR | Status: DC

## 2019-06-03 MED ORDER — INSULIN ASPART 100 UNIT/ML ~~LOC~~ SOLN
0.0000 [IU] | SUBCUTANEOUS | Status: DC
  Administered 2019-06-03 (×3): 2 [IU] via SUBCUTANEOUS
  Administered 2019-06-03: 3 [IU] via SUBCUTANEOUS
  Administered 2019-06-03: 2 [IU] via SUBCUTANEOUS
  Administered 2019-06-04: 3 [IU] via SUBCUTANEOUS
  Administered 2019-06-04 (×3): 2 [IU] via SUBCUTANEOUS
  Administered 2019-06-04: 3 [IU] via SUBCUTANEOUS
  Administered 2019-06-05: 5 [IU] via SUBCUTANEOUS
  Administered 2019-06-05: 3 [IU] via SUBCUTANEOUS
  Administered 2019-06-05: 5 [IU] via SUBCUTANEOUS

## 2019-06-03 MED ORDER — FUROSEMIDE 10 MG/ML IJ SOLN
80.0000 mg | Freq: Once | INTRAMUSCULAR | Status: AC
  Administered 2019-06-03: 80 mg via INTRAVENOUS
  Filled 2019-06-03: qty 8

## 2019-06-03 MED ORDER — PRISMASOL BGK 4/2.5 32-4-2.5 MEQ/L REPLACEMENT SOLN
Status: DC
  Filled 2019-06-03 (×34): qty 5000

## 2019-06-03 MED ORDER — ARTIFICIAL TEARS OPHTHALMIC OINT
1.0000 "application " | TOPICAL_OINTMENT | Freq: Three times a day (TID) | OPHTHALMIC | Status: DC
  Administered 2019-06-03 – 2019-06-05 (×6): 1 via OPHTHALMIC
  Filled 2019-06-03: qty 3.5

## 2019-06-03 MED ORDER — SODIUM CHLORIDE 0.9 % IV SOLN
0.0000 ug/kg/min | INTRAVENOUS | Status: DC
  Administered 2019-06-03: 3 ug/kg/min via INTRAVENOUS
  Administered 2019-06-04: 3.5 ug/kg/min via INTRAVENOUS
  Administered 2019-06-04: 2.5 ug/kg/min via INTRAVENOUS
  Administered 2019-06-04: 3.5 ug/kg/min via INTRAVENOUS
  Filled 2019-06-03 (×6): qty 20

## 2019-06-03 MED ORDER — MIDAZOLAM BOLUS VIA INFUSION
1.0000 mg | INTRAVENOUS | Status: DC | PRN
  Filled 2019-06-03: qty 2

## 2019-06-03 MED ORDER — DEXTROSE 5 % IV SOLN: INTRAVENOUS | Status: AC

## 2019-06-03 MED ORDER — CALCIUM GLUCONATE-NACL 1-0.675 GM/50ML-% IV SOLN
1.0000 g | Freq: Once | INTRAVENOUS | Status: AC
  Administered 2019-06-03: 1000 mg via INTRAVENOUS
  Filled 2019-06-03: qty 50

## 2019-06-03 MED ORDER — SODIUM CHLORIDE 0.9 % IV SOLN
2.0000 g | INTRAVENOUS | Status: DC
  Filled 2019-06-03: qty 2

## 2019-06-03 MED ORDER — CISATRACURIUM BOLUS VIA INFUSION
10.0000 mg | Freq: Once | INTRAVENOUS | Status: DC
  Filled 2019-06-03: qty 10

## 2019-06-03 MED ORDER — SODIUM CHLORIDE 0.9% FLUSH
10.0000 mL | Freq: Two times a day (BID) | INTRAVENOUS | Status: DC
  Administered 2019-06-03 – 2019-06-11 (×16): 10 mL
  Administered 2019-06-12: 20 mL
  Administered 2019-06-12 – 2019-06-18 (×12): 10 mL

## 2019-06-03 MED ORDER — HEPARIN SODIUM (PORCINE) 1000 UNIT/ML DIALYSIS
1000.0000 [IU] | INTRAMUSCULAR | Status: DC | PRN
  Administered 2019-06-07 – 2019-06-13 (×3): 2800 [IU] via INTRAVENOUS_CENTRAL
  Filled 2019-06-03 (×2): qty 3
  Filled 2019-06-03 (×3): qty 6
  Filled 2019-06-03: qty 4
  Filled 2019-06-03: qty 6

## 2019-06-03 MED ORDER — FENTANYL 2500MCG IN NS 250ML (10MCG/ML) PREMIX INFUSION
50.0000 ug/h | INTRAVENOUS | Status: DC
  Administered 2019-06-03 – 2019-06-05 (×3): 200 ug/h via INTRAVENOUS
  Administered 2019-06-05: 150 ug/h via INTRAVENOUS
  Administered 2019-06-06: 200 ug/h via INTRAVENOUS
  Administered 2019-06-07: 250 ug/h via INTRAVENOUS
  Administered 2019-06-07: 125 ug/h via INTRAVENOUS
  Administered 2019-06-08: 250 ug/h via INTRAVENOUS
  Administered 2019-06-08 – 2019-06-09 (×2): 125 ug/h via INTRAVENOUS
  Administered 2019-06-10: 100 ug/h via INTRAVENOUS
  Administered 2019-06-11: 125 ug/h via INTRAVENOUS
  Administered 2019-06-12: 75 ug/h via INTRAVENOUS
  Filled 2019-06-03 (×15): qty 250

## 2019-06-03 MED ORDER — FENTANYL BOLUS VIA INFUSION
50.0000 ug | INTRAVENOUS | Status: DC | PRN
  Administered 2019-06-06 – 2019-06-14 (×12): 50 ug via INTRAVENOUS
  Administered 2019-06-15: 12.5 ug via INTRAVENOUS
  Administered 2019-06-15: 50 ug via INTRAVENOUS
  Administered 2019-06-15: 25 ug via INTRAVENOUS
  Administered 2019-06-15: 12.5 ug via INTRAVENOUS
  Administered 2019-06-16 – 2019-06-17 (×6): 50 ug via INTRAVENOUS
  Filled 2019-06-03: qty 50

## 2019-06-03 MED ORDER — MIDAZOLAM HCL 2 MG/2ML IJ SOLN
2.0000 mg | INTRAMUSCULAR | Status: DC | PRN
  Administered 2019-06-03 (×2): 2 mg via INTRAVENOUS
  Filled 2019-06-03 (×2): qty 2

## 2019-06-03 MED ORDER — MIDAZOLAM 50MG/50ML (1MG/ML) PREMIX INFUSION
2.0000 mg/h | INTRAVENOUS | Status: DC
  Administered 2019-06-03: 2 mg/h via INTRAVENOUS
  Administered 2019-06-03 – 2019-06-04 (×2): 4 mg/h via INTRAVENOUS
  Administered 2019-06-05: 3 mg/h via INTRAVENOUS
  Filled 2019-06-03 (×4): qty 50

## 2019-06-03 MED ORDER — MIDAZOLAM HCL 2 MG/2ML IJ SOLN: 2.0000 mg | INTRAMUSCULAR | Status: DC | PRN

## 2019-06-03 MED ORDER — HYDROCORTISONE NICU INJ SYRINGE 50 MG/ML: 100.0000 mg | Freq: Three times a day (TID) | INTRAVENOUS | Status: DC

## 2019-06-03 MED ORDER — NOREPINEPHRINE 16 MG/250ML-% IV SOLN
0.0000 ug/min | INTRAVENOUS | Status: DC
  Administered 2019-06-04: 25 ug/min via INTRAVENOUS
  Administered 2019-06-04: 26 ug/min via INTRAVENOUS
  Administered 2019-06-04: 16 ug/min via INTRAVENOUS
  Administered 2019-06-05: 8 ug/min via INTRAVENOUS
  Filled 2019-06-03 (×3): qty 250
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
## 2019-06-03 NOTE — Progress Notes (Signed)
OT Cancellation Note  Patient Details Name: Ian Velasquez MRN: [id number] DOB: 1955/03/09   Cancelled Treatment:    Reason Eval/Treat Not Completed: Medical issues which prohibited therapy;Patient not medically ready. Intubated. RN state hold due to medical status. Will return as schedule allows. Thank you.  Yonna Alwin M Shatona Andujar Arriana Lohmann MSOT, OTR/L Acute Rehab Pager: [phone] Office: [phone] 06/03/2019, 7:51 AM

## 2019-06-03 NOTE — Progress Notes (Signed)
PT Cancellation Note  Patient Details Name: Ian Velasquez MRN: [id number] DOB: March 15, 1955   Cancelled Treatment:    Reason Eval/Treat Not Completed: Medical issues which prohibited therapy. Intubated, not medically ready for therapy at this time.   Arlyss Gandy 06/03/2019, 8:19 AM

## 2019-06-03 NOTE — Progress Notes (Signed)
PHARMACY NOTE:  ANTIMICROBIAL RENAL DOSAGE ADJUSTMENT  Current antimicrobial regimen includes a mismatch between antimicrobial dosage and estimated renal function.  As per policy approved by the Pharmacy & Therapeutics and Medical Executive Committees, the antimicrobial dosage will be adjusted accordingly.  Current antimicrobial dosage:  Cefepime 2g IV q12h   Indication: aspiration pneumonia   Renal Function: Scr decreased from 1.8 to 4.68 with current CrCl of 23 ml/min  Estimated Creatinine Clearance: 23.1 mL/min (A) (by C-G formula based on SCr of 4.68 mg/dL (H)). []      On intermittent HD, scheduled: []      On CRRT    Antimicrobial dosage has been changed to:  Cefepime 2g IV q24h   Additional comments: N/A  Thank you for allowing pharmacy to be a part of this patient's care.  , PharmD PGY1 Pharmacy Resident [employer]: [phone]  06/03/2019 6:39 AM

## 2019-06-03 NOTE — Progress Notes (Addendum)
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 intubation>>ETT remains in place -High oxygen and PEEP demands with sub adequate SPO2 -Continued on vasopressors -Suspect aspiration PNA>>temp peak at 103 today -Lactic acid 4.0  5. Sepsis induced AKI and multi organ failure: -Creatinine noted to be markedly elevated from 06/02/19 at 4.68 -Was 1.80 yesterday  -Will likely need nephrology consultation for CRRT support    Signed, Kathyrn Drown NP-C [facility] Pager: [phone] 06/03/2019, 10:59 AM     For questions or updates, please contact   Please consult [email or web address] for contact info under Cardiology/STEMI.  Attending Note:   The patient was seen and examined.  Agree with assessment and plan as noted above.  Changes made to the above note as needed.  Patient seen and independently examined with  Kathyrn Drown, NP .   We discussed all aspects of the encounter. I agree with the assessment and plan as stated above.  1.  Abnormal ECG :   Difficult to explain.  Possible mild cardiac contusion. Although troponin levels are not elevated.  Echo shows normal LV functio - although the echo is technically very difficult  2.  Sepsis:   He is  unlikely to have endocarditis We can do a TEE  But I think we should allow him to recover quite a bit more before we consider doing tee.       I have spent a total of 40 minutes with patient reviewing hospital  notes , telemetry, EKGs, labs and examining patient as well as establishing an assessment and plan that was discussed with the patient. > 50% of time was spent in direct patient care.    Thayer Headings, Brooke Bonito., MD, [facility] 06/03/2019, 12:54 PM [address]  [place] Pager [phone]

## 2019-06-03 NOTE — Progress Notes (Signed)
PHARMACY NOTE:  ANTIMICROBIAL RENAL DOSAGE ADJUSTMENT  Current antimicrobial regimen includes a mismatch between antimicrobial dosage and estimated renal function.  As per policy approved by the Pharmacy & Therapeutics and Medical Executive Committees, the antimicrobial dosage will be adjusted accordingly.  Current antimicrobial dosage:  Cefepime 2g IV q24h  Indication: aspiration PNA  Renal Function: [x]      On CRRT    Antimicrobial dosage has been changed to:  Cefepime 2g IV q12h  Additional comments: Will adjust dose when CRRT started today    , PharmD, BCPS Please check AMION for all [facility] contact numbers Clinical Pharmacist 06/03/2019 3:39 PM
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
## 2019-06-03 NOTE — Progress Notes (Signed)
   Procedure Note  Date: 06/03/2019  Procedure: central venous catheter placement--right, internal jugular vein, with ultrasound guidance  Pre-op diagnosis: renal dysfunction, need for initiation of renal replacement therapy Post-op diagnosis: same  Surgeon: Diamantina Monks, MD  Anesthesia: local  EBL: <5cc Drains/Implants: 20 cm, triple lumen central venous catheter  Description of procedure: Time-out was performed verifying correct patient, procedure, site, laterality, and signature of informed consent. The right neck was prepped and draped in the usual sterile fashion. The internal jugular vein was localized with ultrasound guidance. Five ccs of local anesthetic was infiltrated at the site of venous access. The internal jugular vein was accessed using an introducer needle and a guidewire passed through the needle. The needle was removed and a skin nick was made. The tract was dilated and the central venous catheter advanced over the guidewire followed by removal of the guidewire. All ports drew blood easily and all were flushed with saline. The catheter was secured to the skin with suture and a sterile dressing. The patient tolerated the procedure well. There were no immediate complications. Follow up chest x-ray was ordered to confirm positioning and the absence of a pneumothorax.   Diamantina Monks, MD General and Trauma Surgery [facility]
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
## 2019-06-03 NOTE — Progress Notes (Signed)
SLP Cancellation Note  Patient Details Name: Ian Velasquez MRN: [id number] DOB: 01-Feb-1956   Cancelled treatment:       Reason Eval/Treat Not Completed: Medical issues which prohibited therapy; pt with vomiting and concern for aspiration, now intubated. Will f/u as able.    Mahala Menghini., M.A. CCC-SLP Acute Rehabilitation Services Pager [phone] Office [phone]  06/03/2019, 8:00 AM

## 2019-06-04 DIAGNOSIS — R9431 Abnormal electrocardiogram [ECG] [EKG]: Secondary | ICD-10-CM | POA: Diagnosis not present

## 2019-06-04 LAB — GLUCOSE, CAPILLARY
Glucose-Capillary: 149 mg/dL — ABNORMAL HIGH (ref 70–99)
Glucose-Capillary: 143 mg/dL — ABNORMAL HIGH (ref 70–99)
Glucose-Capillary: 131 mg/dL — ABNORMAL HIGH (ref 70–99)
Glucose-Capillary: 151 mg/dL — ABNORMAL HIGH (ref 70–99)
Glucose-Capillary: 181 mg/dL — ABNORMAL HIGH (ref 70–99)
Glucose-Capillary: 191 mg/dL — ABNORMAL HIGH (ref 70–99)

## 2019-06-04 LAB — POCT I-STAT 7, (LYTES, BLD GAS, ICA,H+H)
Acid-base deficit: 1 mmol/L (ref 0.0–2.0)
Bicarbonate: 25 mmol/L (ref 20.0–28.0)
Calcium, Ion: 0.98 mmol/L — ABNORMAL LOW (ref 1.15–1.40)
HCT: 27 % — ABNORMAL LOW (ref 39.0–52.0)
Hemoglobin: 9.2 g/dL — ABNORMAL LOW (ref 13.0–17.0)
O2 Saturation: 100 %
Patient temperature: 96.8
Potassium: 4.5 mmol/L (ref 3.5–5.1)
Sodium: 146 mmol/L — ABNORMAL HIGH (ref 135–145)
TCO2: 27 mmol/L (ref 22–32)
pCO2 arterial: 48 mmHg (ref 32.0–48.0)
pH, Arterial: 7.32 — ABNORMAL LOW (ref 7.350–7.450)
pO2, Arterial: 402 mmHg — ABNORMAL HIGH (ref 83.0–108.0)

## 2019-06-04 LAB — DIFFERENTIAL
Abs Immature Granulocytes: 0 10*3/uL (ref 0.00–0.07)
Basophils Absolute: 0 10*3/uL (ref 0.0–0.1)
Basophils Relative: 0 %
Eosinophils Absolute: 0 10*3/uL (ref 0.0–0.5)
Eosinophils Relative: 0 %
Lymphocytes Relative: 11 %
Lymphs Abs: 1.7 10*3/uL (ref 0.7–4.0)
Monocytes Absolute: 0.5 10*3/uL (ref 0.1–1.0)
Monocytes Relative: 3 %
Neutro Abs: 13 10*3/uL — ABNORMAL HIGH (ref 1.7–7.7)
Neutrophils Relative %: 86 %
nRBC: 11 /100 WBC — ABNORMAL HIGH

## 2019-06-04 LAB — CBC
HCT: 30 % — ABNORMAL LOW (ref 39.0–52.0)
Hemoglobin: 8.9 g/dL — ABNORMAL LOW (ref 13.0–17.0)
MCH: 28.6 pg (ref 26.0–34.0)
MCHC: 29.7 g/dL — ABNORMAL LOW (ref 30.0–36.0)
MCV: 96.5 fL (ref 80.0–100.0)
Platelets: 357 10*3/uL (ref 150–400)
RBC: 3.11 MIL/uL — ABNORMAL LOW (ref 4.22–5.81)
RDW: 18.4 % — ABNORMAL HIGH (ref 11.5–15.5)
WBC: 15.1 10*3/uL — ABNORMAL HIGH (ref 4.0–10.5)
nRBC: 5.8 % — ABNORMAL HIGH (ref 0.0–0.2)

## 2019-06-04 LAB — RENAL FUNCTION PANEL
Albumin: 2 g/dL — ABNORMAL LOW (ref 3.5–5.0)
Anion gap: 16 — ABNORMAL HIGH (ref 5–15)
BUN: 77 mg/dL — ABNORMAL HIGH (ref 8–23)
CO2: 21 mmol/L — ABNORMAL LOW (ref 22–32)
Calcium: 7.3 mg/dL — ABNORMAL LOW (ref 8.9–10.3)
Chloride: 105 mmol/L (ref 98–111)
Creatinine, Ser: 3.29 mg/dL — ABNORMAL HIGH (ref 0.61–1.24)
GFR calc Af Amer: 22 mL/min — ABNORMAL LOW (ref >60)
GFR calc non Af Amer: 19 mL/min — ABNORMAL LOW (ref >60)
Glucose, Bld: 176 mg/dL — ABNORMAL HIGH (ref 70–99)
Phosphorus: 5.5 mg/dL — ABNORMAL HIGH (ref 2.5–4.6)
Potassium: 4.8 mmol/L (ref 3.5–5.1)
Sodium: 142 mmol/L (ref 135–145)

## 2019-06-04 LAB — COMPREHENSIVE METABOLIC PANEL
ALT: 49 U/L — ABNORMAL HIGH (ref 0–44)
AST: 93 U/L — ABNORMAL HIGH (ref 15–41)
Albumin: 2.1 g/dL — ABNORMAL LOW (ref 3.5–5.0)
Alkaline Phosphatase: 59 U/L (ref 38–126)
Anion gap: 16 — ABNORMAL HIGH (ref 5–15)
BUN: 84 mg/dL — ABNORMAL HIGH (ref 8–23)
CO2: 20 mmol/L — ABNORMAL LOW (ref 22–32)
Calcium: 7.4 mg/dL — ABNORMAL LOW (ref 8.9–10.3)
Chloride: 110 mmol/L (ref 98–111)
Creatinine, Ser: 3.64 mg/dL — ABNORMAL HIGH (ref 0.61–1.24)
GFR calc Af Amer: 19 mL/min — ABNORMAL LOW (ref >60)
GFR calc non Af Amer: 17 mL/min — ABNORMAL LOW (ref >60)
Glucose, Bld: 156 mg/dL — ABNORMAL HIGH (ref 70–99)
Potassium: 4.6 mmol/L (ref 3.5–5.1)
Sodium: 146 mmol/L — ABNORMAL HIGH (ref 135–145)
Total Bilirubin: 16.5 mg/dL — ABNORMAL HIGH (ref 0.3–1.2)
Total Protein: 6 g/dL — ABNORMAL LOW (ref 6.5–8.1)

## 2019-06-04 LAB — CULTURE, RESPIRATORY W GRAM STAIN
Gram Stain: NONE SEEN
Special Requests: NORMAL

## 2019-06-04 LAB — TRIGLYCERIDES: Triglycerides: 702 mg/dL — ABNORMAL HIGH (ref <150)

## 2019-06-04 LAB — PREALBUMIN: Prealbumin: 6.4 mg/dL — ABNORMAL LOW (ref 18–38)

## 2019-06-04 LAB — PHOSPHORUS: Phosphorus: 6 mg/dL — ABNORMAL HIGH (ref 2.5–4.6)

## 2019-06-04 LAB — MAGNESIUM: Magnesium: 3.2 mg/dL — ABNORMAL HIGH (ref 1.7–2.4)

## 2019-06-04 MED ORDER — CALCIUM GLUCONATE-NACL 1-0.675 GM/50ML-% IV SOLN
1.0000 g | Freq: Once | INTRAVENOUS | Status: AC
  Administered 2019-06-04: 1000 mg via INTRAVENOUS
  Filled 2019-06-04: qty 50

## 2019-06-04 MED ORDER — TRACE MINERALS CU-MN-SE-ZN 300-55-60-3000 MCG/ML IV SOLN
INTRAVENOUS | Status: AC
  Filled 2019-06-04: qty 480

## 2019-06-04 NOTE — Progress Notes (Signed)
PT Cancellation Note  Patient Details Name: Ian Velasquez MRN: [id number] DOB: 06/01/1955   Cancelled Treatment:    Reason Eval/Treat Not Completed: Medical issues which prohibited therapy, CRRT at this time   Fabio Asa 06/04/2019, 7:45 AM
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
## 2019-06-04 NOTE — Progress Notes (Signed)
Per Dr. Bedelia Person, okay to wean peep to 10 then continue to wean FiO2 to 40% as tolerated.

## 2019-06-04 NOTE — Progress Notes (Signed)
Trauma/Critical Care Follow Up Note  Subjective:    Overnight Issues:   Objective:  Vital signs for last 24 hours: Temp:  [94.2 F (34.6 C)-98.7 F (37.1 C)] 97.6 F (36.4 C) (05/01 0800) Pulse Rate:  [64-114] 83 (05/01 0800) Resp:  [16-20] 20 (05/01 0800) BP: (87-135)/(40-71) 95/65 (05/01 0800) SpO2:  [87 %-100 %] 99 % (05/01 0800) Arterial Line BP: (87-126)/(44-61) 92/56 (05/01 0800) FiO2 (%):  [60 %-100 %] 60 % (05/01 0800) Weight:  [127.6 kg-128.3 kg] 128.3 kg (05/01 0500)  Hemodynamic parameters for last 24 hours:    Intake/Output from previous day: 04/30 0701 - 05/01 0700 In: 3654.7 [I.V.:3204.3; IV Piggyback:450.5] Out: 3137 [Urine:553; Emesis/NG output:500]  Intake/Output this shift: Total I/O In: 380.1 [I.V.:280.1; IV Piggyback:100] Out: 334 [Urine:6; Emesis/NG output:25; Other:303]  Vent settings for last 24 hours: Vent Mode: PRVC FiO2 (%):  [60 %-100 %] 60 % Set Rate:  [16 bmp-20 bmp] 20 bmp Vt Set:  [194 mL] 670 mL PEEP:  [10 cmH20-14 cmH20] 14 cmH20 Plateau Pressure:  [24 cmH20-33 cmH20] 33 cmH20  Physical Exam:  Gen: no distress Neuro: chemically paralyzed HEENT: intubated Neck: supple CV: RRR Pulm: unlabored breathing, mechanically ventilated Abd: soft, nontender, improved distention GU: oliguric Extr: wwp, no edema   Results for orders placed or performed during the hospital encounter of 07/01/2019 (from the past 24 hour(s))  Glucose, capillary     Status: Abnormal   Collection Time: 06/03/19  9:17 AM  Result Value Ref Range   Glucose-Capillary 154 (H) 70 - 99 mg/dL  Bilirubin, fractionated(tot/dir/indir)     Status: Abnormal   Collection Time: 06/03/19 10:04 AM  Result Value Ref Range   Total Bilirubin 16.1 (H) 0.3 - 1.2 mg/dL   Bilirubin, Direct 9.4 (H) 0.0 - 0.2 mg/dL   Indirect Bilirubin 6.7 (H) 0.3 - 0.9 mg/dL  Glucose, capillary     Status: Abnormal   Collection Time: 06/03/19 11:35 AM  Result Value Ref Range   Glucose-Capillary 127 (H) 70 - 99 mg/dL  I-STAT 7, (LYTES, BLD GAS, ICA, H+H)     Status: Abnormal   Collection Time: 06/03/19  2:39 PM  Result Value Ref Range   pH, Arterial 7.243 (L) 7.350 - 7.450   pCO2 arterial 53.1 (H) 32.0 - 48.0 mmHg   pO2, Arterial 63 (L) 83.0 - 108.0 mmHg   Bicarbonate 22.9 20.0 - 28.0 mmol/L   TCO2 25 22 - 32 mmol/L   O2 Saturation 87.0 %   Acid-base deficit 4.0 (H) 0.0 - 2.0 mmol/L   Sodium 152 (H) 135 - 145 mmol/L   Potassium 4.1 3.5 - 5.1 mmol/L   Calcium, Ion 0.98 (L) 1.15 - 1.40 mmol/L   HCT 25.0 (L) 39.0 - 52.0 %   Hemoglobin 8.5 (L) 13.0 - 17.0 g/dL   Patient temperature 98.4 F    Collection site [employer] features editor by Operator    Sample type ARTERIAL   Glucose, capillary     Status: Abnormal   Collection Time: 06/03/19  4:05 PM  Result Value Ref Range   Glucose-Capillary 123 (H) 70 - 99 mg/dL  Renal function panel (daily at 1600)     Status: Abnormal   Collection Time: 06/03/19  6:00 PM  Result Value Ref Range   Sodium 153 (H) 135 - 145 mmol/L   Potassium 4.7 3.5 - 5.1 mmol/L   Chloride 115 (H) 98 - 111 mmol/L   CO2 22 22 - 32 mmol/L  Glucose, Bld 139 (H) 70 - 99 mg/dL   BUN 295 (H) 8 - 23 mg/dL   Creatinine, Ser 1.88 (H) 0.61 - 1.24 mg/dL   Calcium 7.1 (L) 8.9 - 10.3 mg/dL   Phosphorus 8.5 (H) 2.5 - 4.6 mg/dL   Albumin 2.0 (L) 3.5 - 5.0 g/dL   GFR calc non Af Amer 10 (L) >60 mL/min   GFR calc Af Amer 12 (L) >60 mL/min   Anion gap 16 (H) 5 - 15  Glucose, capillary     Status: Abnormal   Collection Time: 06/03/19  7:35 PM  Result Value Ref Range   Glucose-Capillary 140 (H) 70 - 99 mg/dL  I-STAT 7, (LYTES, BLD GAS, ICA, H+H)     Status: Abnormal   Collection Time: 06/03/19  8:41 PM  Result Value Ref Range   pH, Arterial 7.205 (L) 7.350 - 7.450   pCO2 arterial 58.5 (H) 32.0 - 48.0 mmHg   pO2, Arterial 76 (L) 83.0 - 108.0 mmHg   Bicarbonate 23.1 20.0 - 28.0 mmol/L   TCO2 25 22 - 32 mmol/L   O2 Saturation 91.0 %   Acid-base deficit 5.0  (H) 0.0 - 2.0 mmol/L   Sodium 150 (H) 135 - 145 mmol/L   Potassium 4.6 3.5 - 5.1 mmol/L   Calcium, Ion 0.97 (L) 1.15 - 1.40 mmol/L   HCT 26.0 (L) 39.0 - 52.0 %   Hemoglobin 8.8 (L) 13.0 - 17.0 g/dL   Collection site art line    Drawn by RT    Sample type ARTERIAL   Glucose, capillary     Status: Abnormal   Collection Time: 06/03/19 11:30 PM  Result Value Ref Range   Glucose-Capillary 126 (H) 70 - 99 mg/dL  I-STAT 7, (LYTES, BLD GAS, ICA, H+H)     Status: Abnormal   Collection Time: 06/04/19  3:30 AM  Result Value Ref Range   pH, Arterial 7.320 (L) 7.350 - 7.450   pCO2 arterial 48.0 32.0 - 48.0 mmHg   pO2, Arterial 402 (H) 83.0 - 108.0 mmHg   Bicarbonate 25.0 20.0 - 28.0 mmol/L   TCO2 27 22 - 32 mmol/L   O2 Saturation 100.0 %   Acid-base deficit 1.0 0.0 - 2.0 mmol/L   Sodium 146 (H) 135 - 145 mmol/L   Potassium 4.5 3.5 - 5.1 mmol/L   Calcium, Ion 0.98 (L) 1.15 - 1.40 mmol/L   HCT 27.0 (L) 39.0 - 52.0 %   Hemoglobin 9.2 (L) 13.0 - 17.0 g/dL   Patient temperature 41.6 F    Sample type ARTERIAL   Glucose, capillary     Status: Abnormal   Collection Time: 06/04/19  3:35 AM  Result Value Ref Range   Glucose-Capillary 149 (H) 70 - 99 mg/dL  CBC     Status: Abnormal   Collection Time: 06/04/19  5:00 AM  Result Value Ref Range   WBC 15.1 (H) 4.0 - 10.5 K/uL   RBC 3.11 (L) 4.22 - 5.81 MIL/uL   Hemoglobin 8.9 (L) 13.0 - 17.0 g/dL   HCT 60.6 (L) 30.1 - 60.1 %   MCV 96.5 80.0 - 100.0 fL   MCH 28.6 26.0 - 34.0 pg   MCHC 29.7 (L) 30.0 - 36.0 g/dL   RDW 09.3 (H) 23.5 - 57.3 %   Platelets 357 150 - 400 K/uL   nRBC 5.8 (H) 0.0 - 0.2 %  Comprehensive metabolic panel     Status: Abnormal   Collection Time: 06/04/19  5:00 AM  Result Value Ref Range   Sodium 146 (H) 135 - 145 mmol/L   Potassium 4.6 3.5 - 5.1 mmol/L   Chloride 110 98 - 111 mmol/L   CO2 20 (L) 22 - 32 mmol/L   Glucose, Bld 156 (H) 70 - 99 mg/dL   BUN 84 (H) 8 - 23 mg/dL   Creatinine, Ser 4.33 (H) 0.61 - 1.24 mg/dL    Calcium 7.4 (L) 8.9 - 10.3 mg/dL   Total Protein 6.0 (L) 6.5 - 8.1 g/dL   Albumin 2.1 (L) 3.5 - 5.0 g/dL   AST 93 (H) 15 - 41 U/L   ALT 49 (H) 0 - 44 U/L   Alkaline Phosphatase 59 38 - 126 U/L   Total Bilirubin 16.5 (H) 0.3 - 1.2 mg/dL   GFR calc non Af Amer 17 (L) >60 mL/min   GFR calc Af Amer 19 (L) >60 mL/min   Anion gap 16 (H) 5 - 15  Prealbumin     Status: Abnormal   Collection Time: 06/04/19  5:00 AM  Result Value Ref Range   Prealbumin 6.4 (L) 18 - 38 mg/dL  Magnesium     Status: Abnormal   Collection Time: 06/04/19  5:00 AM  Result Value Ref Range   Magnesium 3.2 (H) 1.7 - 2.4 mg/dL  Phosphorus     Status: Abnormal   Collection Time: 06/04/19  5:00 AM  Result Value Ref Range   Phosphorus 6.0 (H) 2.5 - 4.6 mg/dL  Triglycerides     Status: Abnormal   Collection Time: 06/04/19  5:00 AM  Result Value Ref Range   Triglycerides 702 (H) <150 mg/dL  Differential     Status: Abnormal   Collection Time: 06/04/19  5:00 AM  Result Value Ref Range   Neutrophils Relative % 86 %   Neutro Abs 13.0 (H) 1.7 - 7.7 K/uL   Lymphocytes Relative 11 %   Lymphs Abs 1.7 0.7 - 4.0 K/uL   Monocytes Relative 3 %   Monocytes Absolute 0.5 0.1 - 1.0 K/uL   Eosinophils Relative 0 %   Eosinophils Absolute 0.0 0.0 - 0.5 K/uL   Basophils Relative 0 %   Basophils Absolute 0.0 0.0 - 0.1 K/uL   nRBC 11 (H) 0 /100 WBC   Abs Immature Granulocytes 0.00 0.00 - 0.07 K/uL   Burr Cells PRESENT    Polychromasia PRESENT   Glucose, capillary     Status: Abnormal   Collection Time: 06/04/19  8:27 AM  Result Value Ref Range   Glucose-Capillary 143 (H) 70 - 99 mg/dL    Assessment & Plan: The plan of care was discussed with the bedside nurse for the day, who is in agreement with this plan and no additional concerns were raised.   Present on Admission: **None**    LOS: 11 days   Additional comments:I reviewed the patient's new clinical lab test results.   and I reviewed the patients new imaging test  results.    [facility] Acute hypoxic respiratory failure with ARDS- likely due to aspiration. Intubation now and full support. PEEP 14, begin to wean today, now that FiO2 down to 60%. Continue ARDS protocol, permissive hypercapnia, reversed I:E. Lift paralytic this PM if able to wean to PEEP down to 10. May still need paralytic with reversed I:E for vent synchrony. SAH, concussion- NSGY c/s (Dr. Russella Dar head4/21, Keppra x7d for sz ppx,EEG negative and neuro examimproved, SLP for TBI therapies Comminuted left scapula fx- slingfor now, orthoc/s (Dr. 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 TTE negative for endocarditis, but still needs TEE (not performed due to clinical instability). 4/27 bcx NGTD. Recs per ID for brain MRI when more clinically stable. Aspiration PNA, now on cefepime/flagyl day 3.  Gluteal and possible latissimus dorsi hematoma - possibly the cause of bacteremia. Continue abx. Malnutrition - prealbumin 10.3 Hyperbilirubinemia - mostly conjugated, but significant amount unconj and total is doubled from 4/27. No GBW thickening or peri-cholecystitic fluid on RUQ u/s and CBD normal sized. Cont abx, likely reabsorption of scattered hematomas.  AKI - renal c/s for CRRT, tolerated intiation yesterday, will d/w Renal re: increasing UF, goal net -500/24h. Will quad levo and hopefully get off cis and versed to decrease intake volume. FEN -hold TF for severe ileus, start TPN today DVT - SCDs,SQH (AKI) Dispo - ICU  Critical Care Total Time: 75 minutes  Diamantina Monks, MD Trauma & General Surgery Please  use [email or web address] to contact on call provider  06/04/2019  *Care during the described time interval was provided by me. I have reviewed this patient's available data, including medical history, events of note, physical examination and test results as part of my evaluation.
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
## 2019-06-04 DEATH — deceased

## 2019-06-05 LAB — RENAL FUNCTION PANEL
Albumin: 1.9 g/dL — ABNORMAL LOW (ref 3.5–5.0)
Albumin: 1.8 g/dL — ABNORMAL LOW (ref 3.5–5.0)
Anion gap: 12 (ref 5–15)
Anion gap: 11 (ref 5–15)
BUN: 68 mg/dL — ABNORMAL HIGH (ref 8–23)
BUN: 73 mg/dL — ABNORMAL HIGH (ref 8–23)
CO2: 21 mmol/L — ABNORMAL LOW (ref 22–32)
CO2: 23 mmol/L (ref 22–32)
Calcium: 7.6 mg/dL — ABNORMAL LOW (ref 8.9–10.3)
Calcium: 7.8 mg/dL — ABNORMAL LOW (ref 8.9–10.3)
Chloride: 105 mmol/L (ref 98–111)
Chloride: 105 mmol/L (ref 98–111)
Creatinine, Ser: 2.93 mg/dL — ABNORMAL HIGH (ref 0.61–1.24)
Creatinine, Ser: 2.94 mg/dL — ABNORMAL HIGH (ref 0.61–1.24)
GFR calc Af Amer: 25 mL/min — ABNORMAL LOW (ref >60)
GFR calc Af Amer: 25 mL/min — ABNORMAL LOW (ref >60)
GFR calc non Af Amer: 22 mL/min — ABNORMAL LOW (ref >60)
GFR calc non Af Amer: 21 mL/min — ABNORMAL LOW (ref >60)
Glucose, Bld: 232 mg/dL — ABNORMAL HIGH (ref 70–99)
Glucose, Bld: 217 mg/dL — ABNORMAL HIGH (ref 70–99)
Phosphorus: 4.9 mg/dL — ABNORMAL HIGH (ref 2.5–4.6)
Phosphorus: 4.3 mg/dL (ref 2.5–4.6)
Potassium: 4.5 mmol/L (ref 3.5–5.1)
Potassium: 4.6 mmol/L (ref 3.5–5.1)
Sodium: 138 mmol/L (ref 135–145)
Sodium: 139 mmol/L (ref 135–145)

## 2019-06-05 LAB — CBC
HCT: 27.7 % — ABNORMAL LOW (ref 39.0–52.0)
Hemoglobin: 8.4 g/dL — ABNORMAL LOW (ref 13.0–17.0)
MCH: 28.7 pg (ref 26.0–34.0)
MCHC: 30.3 g/dL (ref 30.0–36.0)
MCV: 94.5 fL (ref 80.0–100.0)
Platelets: 337 10*3/uL (ref 150–400)
RBC: 2.93 MIL/uL — ABNORMAL LOW (ref 4.22–5.81)
RDW: 18.6 % — ABNORMAL HIGH (ref 11.5–15.5)
WBC: 18.5 10*3/uL — ABNORMAL HIGH (ref 4.0–10.5)
nRBC: 3.6 % — ABNORMAL HIGH (ref 0.0–0.2)

## 2019-06-05 LAB — GLUCOSE, CAPILLARY
Glucose-Capillary: 209 mg/dL — ABNORMAL HIGH (ref 70–99)
Glucose-Capillary: 201 mg/dL — ABNORMAL HIGH (ref 70–99)
Glucose-Capillary: 190 mg/dL — ABNORMAL HIGH (ref 70–99)
Glucose-Capillary: 190 mg/dL — ABNORMAL HIGH (ref 70–99)
Glucose-Capillary: 212 mg/dL — ABNORMAL HIGH (ref 70–99)
Glucose-Capillary: 170 mg/dL — ABNORMAL HIGH (ref 70–99)

## 2019-06-05 LAB — CULTURE, BLOOD (ROUTINE X 2)
Culture: NO GROWTH
Culture: NO GROWTH

## 2019-06-05 LAB — IRON AND TIBC
Iron: 68 ug/dL (ref 45–182)
Saturation Ratios: 37 % (ref 17.9–39.5)
TIBC: 182 ug/dL — ABNORMAL LOW (ref 250–450)
UIBC: 114 ug/dL

## 2019-06-05 LAB — POCT I-STAT 7, (LYTES, BLD GAS, ICA,H+H)
Acid-Base Excess: 0 mmol/L (ref 0.0–2.0)
Bicarbonate: 25.2 mmol/L (ref 20.0–28.0)
Calcium, Ion: 1.07 mmol/L — ABNORMAL LOW (ref 1.15–1.40)
HCT: 23 % — ABNORMAL LOW (ref 39.0–52.0)
Hemoglobin: 7.8 g/dL — ABNORMAL LOW (ref 13.0–17.0)
O2 Saturation: 99 %
Potassium: 4.4 mmol/L (ref 3.5–5.1)
Sodium: 138 mmol/L (ref 135–145)
TCO2: 27 mmol/L (ref 22–32)
pCO2 arterial: 43 mmHg (ref 32.0–48.0)
pH, Arterial: 7.376 (ref 7.350–7.450)
pO2, Arterial: 124 mmHg — ABNORMAL HIGH (ref 83.0–108.0)

## 2019-06-05 LAB — FERRITIN: Ferritin: 613 ng/mL — ABNORMAL HIGH (ref 24–336)

## 2019-06-05 LAB — TRIGLYCERIDES: Triglycerides: 762 mg/dL — ABNORMAL HIGH (ref <150)

## 2019-06-05 LAB — MAGNESIUM: Magnesium: 3 mg/dL — ABNORMAL HIGH (ref 1.7–2.4)

## 2019-06-05 MED ORDER — TRACE MINERALS CU-MN-SE-ZN 300-55-60-3000 MCG/ML IV SOLN
INTRAVENOUS | Status: AC
  Filled 2019-06-05: qty 880

## 2019-06-05 MED ORDER — INSULIN ASPART 100 UNIT/ML ~~LOC~~ SOLN
0.0000 [IU] | SUBCUTANEOUS | Status: DC
  Administered 2019-06-05 (×3): 4 [IU] via SUBCUTANEOUS
  Administered 2019-06-05: 7 [IU] via SUBCUTANEOUS
  Administered 2019-06-06 – 2019-06-08 (×13): 4 [IU] via SUBCUTANEOUS
  Administered 2019-06-08 (×3): 7 [IU] via SUBCUTANEOUS
  Administered 2019-06-08 (×2): 4 [IU] via SUBCUTANEOUS
  Administered 2019-06-09 (×5): 7 [IU] via SUBCUTANEOUS
  Administered 2019-06-09: 11 [IU] via SUBCUTANEOUS
  Administered 2019-06-10 (×2): 7 [IU] via SUBCUTANEOUS
  Administered 2019-06-10: 4 [IU] via SUBCUTANEOUS
  Administered 2019-06-10 – 2019-06-11 (×4): 7 [IU] via SUBCUTANEOUS
  Administered 2019-06-11 (×2): 4 [IU] via SUBCUTANEOUS
  Administered 2019-06-11: 3 [IU] via SUBCUTANEOUS
  Administered 2019-06-11: 7 [IU] via SUBCUTANEOUS
  Administered 2019-06-11: 4 [IU] via SUBCUTANEOUS
  Administered 2019-06-12: 1 [IU] via SUBCUTANEOUS
  Administered 2019-06-12: 4 [IU] via SUBCUTANEOUS
  Administered 2019-06-13 – 2019-06-18 (×15): 3 [IU] via SUBCUTANEOUS

## 2019-06-05 MED ORDER — DARBEPOETIN ALFA 40 MCG/0.4ML IJ SOSY
40.0000 ug | PREFILLED_SYRINGE | Freq: Once | INTRAMUSCULAR | Status: AC
  Administered 2019-06-05: 40 ug via SUBCUTANEOUS
  Filled 2019-06-05: qty 0.4

## 2019-06-05 MED ORDER — CEFAZOLIN SODIUM-DEXTROSE 2-4 GM/100ML-% IV SOLN
2.0000 g | Freq: Two times a day (BID) | INTRAVENOUS | Status: DC
  Administered 2019-06-05 – 2019-06-08 (×7): 2 g via INTRAVENOUS
  Filled 2019-06-05 (×8): qty 100
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
## 2019-06-06 DIAGNOSIS — J690 Pneumonitis due to inhalation of food and vomit: Secondary | ICD-10-CM | POA: Diagnosis not present

## 2019-06-06 LAB — RENAL FUNCTION PANEL
Albumin: 1.8 g/dL — ABNORMAL LOW (ref 3.5–5.0)
Anion gap: 13 (ref 5–15)
BUN: 73 mg/dL — ABNORMAL HIGH (ref 8–23)
CO2: 23 mmol/L (ref 22–32)
Calcium: 8 mg/dL — ABNORMAL LOW (ref 8.9–10.3)
Chloride: 101 mmol/L (ref 98–111)
Creatinine, Ser: 2.55 mg/dL — ABNORMAL HIGH (ref 0.61–1.24)
GFR calc Af Amer: 30 mL/min — ABNORMAL LOW (ref >60)
GFR calc non Af Amer: 26 mL/min — ABNORMAL LOW (ref >60)
Glucose, Bld: 178 mg/dL — ABNORMAL HIGH (ref 70–99)
Phosphorus: 3.8 mg/dL (ref 2.5–4.6)
Potassium: 4.5 mmol/L (ref 3.5–5.1)
Sodium: 137 mmol/L (ref 135–145)

## 2019-06-06 LAB — POCT I-STAT 7, (LYTES, BLD GAS, ICA,H+H)
Acid-Base Excess: 3 mmol/L — ABNORMAL HIGH (ref 0.0–2.0)
Bicarbonate: 27.5 mmol/L (ref 20.0–28.0)
Calcium, Ion: 1.1 mmol/L — ABNORMAL LOW (ref 1.15–1.40)
HCT: 24 % — ABNORMAL LOW (ref 39.0–52.0)
Hemoglobin: 8.2 g/dL — ABNORMAL LOW (ref 13.0–17.0)
O2 Saturation: 97 %
Patient temperature: 97.4
Potassium: 4.8 mmol/L (ref 3.5–5.1)
Sodium: 136 mmol/L (ref 135–145)
TCO2: 29 mmol/L (ref 22–32)
pCO2 arterial: 39.5 mmHg (ref 32.0–48.0)
pH, Arterial: 7.449 (ref 7.350–7.450)
pO2, Arterial: 86 mmHg (ref 83.0–108.0)

## 2019-06-06 LAB — COMPREHENSIVE METABOLIC PANEL
ALT: 46 U/L — ABNORMAL HIGH (ref 0–44)
AST: 72 U/L — ABNORMAL HIGH (ref 15–41)
Albumin: 1.8 g/dL — ABNORMAL LOW (ref 3.5–5.0)
Alkaline Phosphatase: 59 U/L (ref 38–126)
Anion gap: 11 (ref 5–15)
BUN: 73 mg/dL — ABNORMAL HIGH (ref 8–23)
CO2: 24 mmol/L (ref 22–32)
Calcium: 7.8 mg/dL — ABNORMAL LOW (ref 8.9–10.3)
Chloride: 103 mmol/L (ref 98–111)
Creatinine, Ser: 2.72 mg/dL — ABNORMAL HIGH (ref 0.61–1.24)
GFR calc Af Amer: 27 mL/min — ABNORMAL LOW (ref >60)
GFR calc non Af Amer: 24 mL/min — ABNORMAL LOW (ref >60)
Glucose, Bld: 209 mg/dL — ABNORMAL HIGH (ref 70–99)
Potassium: 4.5 mmol/L (ref 3.5–5.1)
Sodium: 138 mmol/L (ref 135–145)
Total Bilirubin: 13 mg/dL — ABNORMAL HIGH (ref 0.3–1.2)
Total Protein: 5.3 g/dL — ABNORMAL LOW (ref 6.5–8.1)

## 2019-06-06 LAB — CBC
HCT: 25.2 % — ABNORMAL LOW (ref 39.0–52.0)
Hemoglobin: 7.8 g/dL — ABNORMAL LOW (ref 13.0–17.0)
MCH: 29.3 pg (ref 26.0–34.0)
MCHC: 31 g/dL (ref 30.0–36.0)
MCV: 94.7 fL (ref 80.0–100.0)
Platelets: 278 10*3/uL (ref 150–400)
RBC: 2.66 MIL/uL — ABNORMAL LOW (ref 4.22–5.81)
RDW: 18.5 % — ABNORMAL HIGH (ref 11.5–15.5)
WBC: 18.2 10*3/uL — ABNORMAL HIGH (ref 4.0–10.5)
nRBC: 2.2 % — ABNORMAL HIGH (ref 0.0–0.2)

## 2019-06-06 LAB — GLUCOSE, CAPILLARY
Glucose-Capillary: 164 mg/dL — ABNORMAL HIGH (ref 70–99)
Glucose-Capillary: 176 mg/dL — ABNORMAL HIGH (ref 70–99)
Glucose-Capillary: 180 mg/dL — ABNORMAL HIGH (ref 70–99)
Glucose-Capillary: 181 mg/dL — ABNORMAL HIGH (ref 70–99)
Glucose-Capillary: 192 mg/dL — ABNORMAL HIGH (ref 70–99)
Glucose-Capillary: 192 mg/dL — ABNORMAL HIGH (ref 70–99)

## 2019-06-06 LAB — DIFFERENTIAL
Abs Immature Granulocytes: 2.18 10*3/uL — ABNORMAL HIGH (ref 0.00–0.07)
Basophils Absolute: 0.1 10*3/uL (ref 0.0–0.1)
Basophils Relative: 1 %
Eosinophils Absolute: 0 10*3/uL (ref 0.0–0.5)
Eosinophils Relative: 0 %
Immature Granulocytes: 12 %
Lymphocytes Relative: 8 %
Lymphs Abs: 1.6 10*3/uL (ref 0.7–4.0)
Monocytes Absolute: 0.7 10*3/uL (ref 0.1–1.0)
Monocytes Relative: 4 %
Neutro Abs: 14.1 10*3/uL — ABNORMAL HIGH (ref 1.7–7.7)
Neutrophils Relative %: 75 %

## 2019-06-06 LAB — MAGNESIUM: Magnesium: 2.9 mg/dL — ABNORMAL HIGH (ref 1.7–2.4)

## 2019-06-06 LAB — TRIGLYCERIDES: Triglycerides: 715 mg/dL — ABNORMAL HIGH (ref <150)

## 2019-06-06 LAB — PHOSPHORUS: Phosphorus: 3.5 mg/dL (ref 2.5–4.6)

## 2019-06-06 LAB — PREALBUMIN: Prealbumin: 9.7 mg/dL — ABNORMAL LOW (ref 18–38)

## 2019-06-06 LAB — PATHOLOGIST SMEAR REVIEW

## 2019-06-06 MED ORDER — INSULIN GLARGINE 100 UNIT/ML ~~LOC~~ SOLN
10.0000 [IU] | Freq: Every day | SUBCUTANEOUS | Status: DC
  Administered 2019-06-06 – 2019-06-07 (×2): 10 [IU] via SUBCUTANEOUS
  Filled 2019-06-06 (×3): qty 0.1

## 2019-06-06 MED ORDER — HYDROCORTISONE NA SUCCINATE PF 100 MG IJ SOLR
50.0000 mg | Freq: Three times a day (TID) | INTRAMUSCULAR | Status: DC
  Administered 2019-06-06 – 2019-06-07 (×3): 50 mg via INTRAVENOUS
  Filled 2019-06-06 (×3): qty 2

## 2019-06-06 MED ORDER — MIDAZOLAM HCL 2 MG/2ML IJ SOLN
2.0000 mg | INTRAMUSCULAR | Status: DC | PRN
  Administered 2019-06-07 – 2019-06-09 (×9): 2 mg via INTRAVENOUS
  Administered 2019-06-15 (×2): 1 mg via INTRAVENOUS
  Administered 2019-06-16 – 2019-06-18 (×10): 2 mg via INTRAVENOUS
  Filled 2019-06-06 (×20): qty 2

## 2019-06-06 MED ORDER — TRACE MINERALS CU-MN-SE-ZN 300-55-60-3000 MCG/ML IV SOLN
INTRAVENOUS | Status: AC
  Filled 2019-06-06: qty 1328

## 2019-06-06 NOTE — Progress Notes (Signed)
PT Cancellation Note  Patient Details Name: Ian Velasquez MRN: [id number] DOB: [birth year]   Cancelled Treatment:    Reason Eval/Treat Not Completed: Patient not medically ready. Pt now intubated and on CRRT. Pt with medical decline over the weekend. Acute PT to return as able, as appropriate.  Lewis Shock, PT, DPT Acute Rehabilitation Services Pager #: [phone] Office #: [phone]    [name] 06/06/2019, 8:28 AM

## 2019-06-06 NOTE — Progress Notes (Signed)
Inpatient Rehab Admissions Coordinator:   Will continue to follow from a distance to see if pt remains a candidate for CIR once medically stable.   Estill Dooms, PT, DPT Admissions Coordinator [phone] 06/06/19  10:33 AM
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
## 2019-06-06 NOTE — Progress Notes (Signed)
SLP Cancellation Note  Patient Details Name: Miguelangel Korn MRN: [id number] DOB: Jan 01, 1956   Cancelled treatment:       Reason Eval/Treat Not Completed: Medical issues which prohibited therapy (on vent). Will f/u as able.    Mahala Menghini., M.A. CCC-SLP Acute Rehabilitation Services Pager [phone] Office [phone]  06/06/2019, 8:38 AM
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
## 2019-06-07 ENCOUNTER — Encounter (HOSPITAL_COMMUNITY): Payer: Self-pay | Admitting: Certified Registered Nurse Anesthetist

## 2019-06-07 DIAGNOSIS — J9690 Respiratory failure, unspecified, unspecified whether with hypoxia or hypercapnia: Secondary | ICD-10-CM

## 2019-06-07 LAB — CBC
HCT: 29.8 % — ABNORMAL LOW (ref 39.0–52.0)
Hemoglobin: 8.9 g/dL — ABNORMAL LOW (ref 13.0–17.0)
MCH: 28.6 pg (ref 26.0–34.0)
MCHC: 29.9 g/dL — ABNORMAL LOW (ref 30.0–36.0)
MCV: 95.8 fL (ref 80.0–100.0)
Platelets: 294 10*3/uL (ref 150–400)
RBC: 3.11 MIL/uL — ABNORMAL LOW (ref 4.22–5.81)
RDW: 18.5 % — ABNORMAL HIGH (ref 11.5–15.5)
WBC: 27 10*3/uL — ABNORMAL HIGH (ref 4.0–10.5)
nRBC: 3 % — ABNORMAL HIGH (ref 0.0–0.2)

## 2019-06-07 LAB — RENAL FUNCTION PANEL
Albumin: 2 g/dL — ABNORMAL LOW (ref 3.5–5.0)
Albumin: 2.1 g/dL — ABNORMAL LOW (ref 3.5–5.0)
Anion gap: 13 (ref 5–15)
Anion gap: 13 (ref 5–15)
BUN: 72 mg/dL — ABNORMAL HIGH (ref 8–23)
BUN: 69 mg/dL — ABNORMAL HIGH (ref 8–23)
CO2: 25 mmol/L (ref 22–32)
CO2: 24 mmol/L (ref 22–32)
Calcium: 8.2 mg/dL — ABNORMAL LOW (ref 8.9–10.3)
Calcium: 8.2 mg/dL — ABNORMAL LOW (ref 8.9–10.3)
Chloride: 100 mmol/L (ref 98–111)
Chloride: 100 mmol/L (ref 98–111)
Creatinine, Ser: 2.42 mg/dL — ABNORMAL HIGH (ref 0.61–1.24)
Creatinine, Ser: 2.5 mg/dL — ABNORMAL HIGH (ref 0.61–1.24)
GFR calc Af Amer: 32 mL/min — ABNORMAL LOW (ref >60)
GFR calc Af Amer: 30 mL/min — ABNORMAL LOW (ref >60)
GFR calc non Af Amer: 27 mL/min — ABNORMAL LOW (ref >60)
GFR calc non Af Amer: 26 mL/min — ABNORMAL LOW (ref >60)
Glucose, Bld: 171 mg/dL — ABNORMAL HIGH (ref 70–99)
Glucose, Bld: 197 mg/dL — ABNORMAL HIGH (ref 70–99)
Phosphorus: 2.9 mg/dL (ref 2.5–4.6)
Phosphorus: 2.8 mg/dL (ref 2.5–4.6)
Potassium: 3.6 mmol/L (ref 3.5–5.1)
Potassium: 3.4 mmol/L — ABNORMAL LOW (ref 3.5–5.1)
Sodium: 138 mmol/L (ref 135–145)
Sodium: 137 mmol/L (ref 135–145)

## 2019-06-07 LAB — GLUCOSE, CAPILLARY
Glucose-Capillary: 170 mg/dL — ABNORMAL HIGH (ref 70–99)
Glucose-Capillary: 154 mg/dL — ABNORMAL HIGH (ref 70–99)
Glucose-Capillary: 166 mg/dL — ABNORMAL HIGH (ref 70–99)
Glucose-Capillary: 186 mg/dL — ABNORMAL HIGH (ref 70–99)
Glucose-Capillary: 163 mg/dL — ABNORMAL HIGH (ref 70–99)
Glucose-Capillary: 173 mg/dL — ABNORMAL HIGH (ref 70–99)

## 2019-06-07 LAB — MAGNESIUM: Magnesium: 2.8 mg/dL — ABNORMAL HIGH (ref 1.7–2.4)

## 2019-06-07 MED ORDER — HYDROCORTISONE NA SUCCINATE PF 100 MG IJ SOLR
25.0000 mg | Freq: Two times a day (BID) | INTRAMUSCULAR | Status: DC
  Administered 2019-06-07 – 2019-06-08 (×2): 25 mg via INTRAVENOUS
  Filled 2019-06-07 (×2): qty 2

## 2019-06-07 MED ORDER — VITAL HIGH PROTEIN PO LIQD: 1000.0000 mL | ORAL | Status: DC

## 2019-06-07 MED ORDER — PRO-STAT SUGAR FREE PO LIQD: 30.0000 mL | Freq: Two times a day (BID) | ORAL | Status: DC

## 2019-06-07 MED ORDER — PIVOT 1.5 CAL PO LIQD
1000.0000 mL | ORAL | Status: DC
  Administered 2019-06-07 – 2019-06-09 (×2): 1000 mL

## 2019-06-07 MED ORDER — TRACE MINERALS CU-MN-SE-ZN 300-55-60-3000 MCG/ML IV SOLN
INTRAVENOUS | Status: AC
  Filled 2019-06-07: qty 1328
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
## 2019-06-07 NOTE — Progress Notes (Signed)
SLP Cancellation Note  Patient Details Name: Ian Velasquez MRN: [id number] DOB: 1955-05-19   Cancelled treatment:       Reason Eval/Treat Not Completed: Medical issues which prohibited therapy (remains on vent).    Mahala Menghini., M.A. CCC-SLP Acute Rehabilitation Services Pager [phone] Office [phone]  06/07/2019, 7:49 AM

## 2019-06-07 NOTE — Progress Notes (Signed)
PT Cancellation Note  Patient Details Name: Ian Velasquez MRN: [id number] DOB: 1955/02/23   Cancelled Treatment:    Reason Eval/Treat Not Completed: Patient not medically ready. Pt remains intubated and on CRRT. RN asked to hold today. Acute PT to return as able, as appropriate.  Lewis Shock, PT, DPT Acute Rehabilitation Services Pager #: [phone] Office #: [phone]    [name] 06/07/2019, 9:46 AM
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
## 2019-06-07 NOTE — Progress Notes (Signed)
OT Cancellation Note  Patient Details Name: [name] MRN: [id number] DOB: 01/28/1956   Cancelled Treatment:    Reason Eval/Treat Not Completed: Medical issues which prohibited therapy;Patient not medically ready.  Spoke with RN who suggested to hold today, but he is beginning to wake up.  Will check back.  Eber Jones., OTR/L Acute Rehabilitation Services Pager [phone] Office [phone]   Jeani Hawking M 06/07/2019, 9:43 AM

## 2019-06-08 ENCOUNTER — Encounter (HOSPITAL_COMMUNITY): Admission: EM | Disposition: E | Payer: Self-pay | Source: Home / Self Care

## 2019-06-08 ENCOUNTER — Other Ambulatory Visit (HOSPITAL_COMMUNITY): Payer: PRIVATE HEALTH INSURANCE

## 2019-06-08 DIAGNOSIS — J9601 Acute respiratory failure with hypoxia: Secondary | ICD-10-CM

## 2019-06-08 DIAGNOSIS — N179 Acute kidney failure, unspecified: Secondary | ICD-10-CM

## 2019-06-08 DIAGNOSIS — A4901 Methicillin susceptible Staphylococcus aureus infection, unspecified site: Secondary | ICD-10-CM

## 2019-06-08 LAB — POCT I-STAT 7, (LYTES, BLD GAS, ICA,H+H)
Acid-Base Excess: 0 mmol/L (ref 0.0–2.0)
Bicarbonate: 24.4 mmol/L (ref 20.0–28.0)
Calcium, Ion: 1.13 mmol/L — ABNORMAL LOW (ref 1.15–1.40)
HCT: 35 % — ABNORMAL LOW (ref 39.0–52.0)
Hemoglobin: 11.9 g/dL — ABNORMAL LOW (ref 13.0–17.0)
O2 Saturation: 91 %
Patient temperature: 98.6
Potassium: 4 mmol/L (ref 3.5–5.1)
Sodium: 136 mmol/L (ref 135–145)
TCO2: 25 mmol/L (ref 22–32)
pCO2 arterial: 36.8 mmHg (ref 32.0–48.0)
pH, Arterial: 7.428 (ref 7.350–7.450)
pO2, Arterial: 58 mmHg — ABNORMAL LOW (ref 83.0–108.0)

## 2019-06-08 LAB — RENAL FUNCTION PANEL
Albumin: 2.1 g/dL — ABNORMAL LOW (ref 3.5–5.0)
Albumin: 2.2 g/dL — ABNORMAL LOW (ref 3.5–5.0)
Anion gap: 16 — ABNORMAL HIGH (ref 5–15)
Anion gap: 15 (ref 5–15)
BUN: 70 mg/dL — ABNORMAL HIGH (ref 8–23)
BUN: 72 mg/dL — ABNORMAL HIGH (ref 8–23)
CO2: 23 mmol/L (ref 22–32)
CO2: 23 mmol/L (ref 22–32)
Calcium: 8.2 mg/dL — ABNORMAL LOW (ref 8.9–10.3)
Calcium: 8.6 mg/dL — ABNORMAL LOW (ref 8.9–10.3)
Chloride: 97 mmol/L — ABNORMAL LOW (ref 98–111)
Chloride: 99 mmol/L (ref 98–111)
Creatinine, Ser: 2.26 mg/dL — ABNORMAL HIGH (ref 0.61–1.24)
Creatinine, Ser: 2.37 mg/dL — ABNORMAL HIGH (ref 0.61–1.24)
GFR calc Af Amer: 34 mL/min — ABNORMAL LOW (ref >60)
GFR calc Af Amer: 32 mL/min — ABNORMAL LOW (ref >60)
GFR calc non Af Amer: 30 mL/min — ABNORMAL LOW (ref >60)
GFR calc non Af Amer: 28 mL/min — ABNORMAL LOW (ref >60)
Glucose, Bld: 189 mg/dL — ABNORMAL HIGH (ref 70–99)
Glucose, Bld: 208 mg/dL — ABNORMAL HIGH (ref 70–99)
Phosphorus: 3.7 mg/dL (ref 2.5–4.6)
Phosphorus: 3.7 mg/dL (ref 2.5–4.6)
Potassium: 3.6 mmol/L (ref 3.5–5.1)
Potassium: 4 mmol/L (ref 3.5–5.1)
Sodium: 136 mmol/L (ref 135–145)
Sodium: 137 mmol/L (ref 135–145)

## 2019-06-08 LAB — CBC
HCT: 34.2 % — ABNORMAL LOW (ref 39.0–52.0)
Hemoglobin: 10.3 g/dL — ABNORMAL LOW (ref 13.0–17.0)
MCH: 29.1 pg (ref 26.0–34.0)
MCHC: 30.1 g/dL (ref 30.0–36.0)
MCV: 96.6 fL (ref 80.0–100.0)
Platelets: 349 10*3/uL (ref 150–400)
RBC: 3.54 MIL/uL — ABNORMAL LOW (ref 4.22–5.81)
RDW: 20 % — ABNORMAL HIGH (ref 11.5–15.5)
WBC: 25.6 10*3/uL — ABNORMAL HIGH (ref 4.0–10.5)
nRBC: 7.9 % — ABNORMAL HIGH (ref 0.0–0.2)

## 2019-06-08 LAB — GLUCOSE, CAPILLARY
Glucose-Capillary: 188 mg/dL — ABNORMAL HIGH (ref 70–99)
Glucose-Capillary: 193 mg/dL — ABNORMAL HIGH (ref 70–99)
Glucose-Capillary: 200 mg/dL — ABNORMAL HIGH (ref 70–99)
Glucose-Capillary: 203 mg/dL — ABNORMAL HIGH (ref 70–99)
Glucose-Capillary: 217 mg/dL — ABNORMAL HIGH (ref 70–99)
Glucose-Capillary: 233 mg/dL — ABNORMAL HIGH (ref 70–99)

## 2019-06-08 LAB — PATHOLOGIST SMEAR REVIEW

## 2019-06-08 LAB — MAGNESIUM: Magnesium: 2.7 mg/dL — ABNORMAL HIGH (ref 1.7–2.4)

## 2019-06-08 SURGERY — CANCELLED PROCEDURE

## 2019-06-08 MED ORDER — INSULIN GLARGINE 100 UNIT/ML ~~LOC~~ SOLN
15.0000 [IU] | Freq: Every day | SUBCUTANEOUS | Status: DC
  Administered 2019-06-08 – 2019-06-09 (×2): 15 [IU] via SUBCUTANEOUS
  Filled 2019-06-08 (×4): qty 0.15

## 2019-06-08 MED ORDER — ZINC CHLORIDE 1 MG/ML IV SOLN
INTRAVENOUS | Status: AC
  Filled 2019-06-08: qty 1320

## 2019-06-08 MED ORDER — POTASSIUM CHLORIDE 20 MEQ/15ML (10%) PO SOLN: 40.0000 meq | Freq: Once | ORAL | Status: DC
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
## 2019-06-08 NOTE — Progress Notes (Signed)
Orthopedic Tech Progress Note Patient Details:  Ian Velasquez 12-11-1955 [id number]  Ortho Devices Type of Ortho Device: Prafo boot/shoe Ortho Device/Splint Location: B LE Ortho Device/Splint Interventions: Application   Post Interventions Patient Tolerated: Well Instructions Provided: Adjustment of device   Ian Velasquez 06/23/2019, 11:28 AM

## 2019-06-08 NOTE — Progress Notes (Signed)
PT Cancellation Note  Patient Details Name: Ian Velasquez MRN: [id number] DOB: 1955/06/03   Cancelled Treatment:    Reason Eval/Treat Not Completed: Patient not medically ready. Pt noted to have sedation, currently with CRRT running, increased Peep on vent and not arousing per RN staff. PT will attempt to return when pt is better able to participate in skilled PT intervention.   Arlyss Gandy 2019-06-27, 1:19 PM

## 2019-06-08 NOTE — Progress Notes (Signed)
OT Cancellation Note  Patient Details Name: Ankur Snowdon MRN: [id number] DOB: [birth year]   Cancelled Treatment:    Reason Eval/Treat Not Completed: Patient not medically ready. Pt noted to have sedation, currently with CRRT running, increased Peep on vent and not arousing per RN staff. OT to check back at a more appropriate time to initiate evaluation when patient can be aroused without sedation being turned down   Timmothy Euler, OTR/L  Acute Rehabilitation Services Pager: [phone] Office: [phone] .  Mateo Flow 07/01/2019, 9:55 AM
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
## 2019-06-09 ENCOUNTER — Inpatient Hospital Stay (HOSPITAL_COMMUNITY): Payer: PRIVATE HEALTH INSURANCE

## 2019-06-09 ENCOUNTER — Other Ambulatory Visit (HOSPITAL_COMMUNITY): Payer: PRIVATE HEALTH INSURANCE

## 2019-06-09 DIAGNOSIS — I10 Essential (primary) hypertension: Secondary | ICD-10-CM

## 2019-06-09 DIAGNOSIS — R7881 Bacteremia: Secondary | ICD-10-CM

## 2019-06-09 LAB — GLUCOSE, CAPILLARY
Glucose-Capillary: 222 mg/dL — ABNORMAL HIGH (ref 70–99)
Glucose-Capillary: 255 mg/dL — ABNORMAL HIGH (ref 70–99)
Glucose-Capillary: 245 mg/dL — ABNORMAL HIGH (ref 70–99)
Glucose-Capillary: 210 mg/dL — ABNORMAL HIGH (ref 70–99)
Glucose-Capillary: 218 mg/dL — ABNORMAL HIGH (ref 70–99)
Glucose-Capillary: 234 mg/dL — ABNORMAL HIGH (ref 70–99)

## 2019-06-09 LAB — CBC
HCT: 35.6 % — ABNORMAL LOW (ref 39.0–52.0)
Hemoglobin: 10.6 g/dL — ABNORMAL LOW (ref 13.0–17.0)
MCH: 29 pg (ref 26.0–34.0)
MCHC: 29.8 g/dL — ABNORMAL LOW (ref 30.0–36.0)
MCV: 97.3 fL (ref 80.0–100.0)
Platelets: 316 10*3/uL (ref 150–400)
RBC: 3.66 MIL/uL — ABNORMAL LOW (ref 4.22–5.81)
RDW: 21.5 % — ABNORMAL HIGH (ref 11.5–15.5)
WBC: 21.1 10*3/uL — ABNORMAL HIGH (ref 4.0–10.5)
nRBC: 7.1 % — ABNORMAL HIGH (ref 0.0–0.2)

## 2019-06-09 LAB — COMPREHENSIVE METABOLIC PANEL
ALT: 36 U/L (ref 0–44)
AST: 82 U/L — ABNORMAL HIGH (ref 15–41)
Albumin: 2.2 g/dL — ABNORMAL LOW (ref 3.5–5.0)
Alkaline Phosphatase: 152 U/L — ABNORMAL HIGH (ref 38–126)
Anion gap: 20 — ABNORMAL HIGH (ref 5–15)
BUN: 100 mg/dL — ABNORMAL HIGH (ref 8–23)
CO2: 19 mmol/L — ABNORMAL LOW (ref 22–32)
Calcium: 8.4 mg/dL — ABNORMAL LOW (ref 8.9–10.3)
Chloride: 94 mmol/L — ABNORMAL LOW (ref 98–111)
Creatinine, Ser: 3.55 mg/dL — ABNORMAL HIGH (ref 0.61–1.24)
GFR calc Af Amer: 20 mL/min — ABNORMAL LOW (ref >60)
GFR calc non Af Amer: 17 mL/min — ABNORMAL LOW (ref >60)
Glucose, Bld: 307 mg/dL — ABNORMAL HIGH (ref 70–99)
Potassium: 4.9 mmol/L (ref 3.5–5.1)
Sodium: 133 mmol/L — ABNORMAL LOW (ref 135–145)
Total Bilirubin: 21 mg/dL (ref 0.3–1.2)
Total Protein: 7.3 g/dL (ref 6.5–8.1)

## 2019-06-09 LAB — RENAL FUNCTION PANEL
Albumin: 2.3 g/dL — ABNORMAL LOW (ref 3.5–5.0)
Anion gap: 16 — ABNORMAL HIGH (ref 5–15)
BUN: 112 mg/dL — ABNORMAL HIGH (ref 8–23)
CO2: 21 mmol/L — ABNORMAL LOW (ref 22–32)
Calcium: 8.2 mg/dL — ABNORMAL LOW (ref 8.9–10.3)
Chloride: 99 mmol/L (ref 98–111)
Creatinine, Ser: 3.59 mg/dL — ABNORMAL HIGH (ref 0.61–1.24)
GFR calc Af Amer: 20 mL/min — ABNORMAL LOW (ref >60)
GFR calc non Af Amer: 17 mL/min — ABNORMAL LOW (ref >60)
Glucose, Bld: 232 mg/dL — ABNORMAL HIGH (ref 70–99)
Phosphorus: 4.7 mg/dL — ABNORMAL HIGH (ref 2.5–4.6)
Potassium: 4.5 mmol/L (ref 3.5–5.1)
Sodium: 136 mmol/L (ref 135–145)

## 2019-06-09 LAB — PHOSPHORUS: Phosphorus: 5.3 mg/dL — ABNORMAL HIGH (ref 2.5–4.6)

## 2019-06-09 LAB — MAGNESIUM: Magnesium: 3 mg/dL — ABNORMAL HIGH (ref 1.7–2.4)

## 2019-06-09 MED ORDER — ALBUMIN HUMAN 5 % IV SOLN
12.5000 g | Freq: Once | INTRAVENOUS | Status: AC
  Administered 2019-06-09: 12.5 g via INTRAVENOUS
  Filled 2019-06-09: qty 250

## 2019-06-09 MED ORDER — LORAZEPAM 2 MG/ML IJ SOLN
2.0000 mg | Freq: Once | INTRAMUSCULAR | Status: AC
  Administered 2019-06-09: 2 mg via INTRAVENOUS
  Filled 2019-06-09: qty 1

## 2019-06-09 MED ORDER — CEFAZOLIN SODIUM-DEXTROSE 1-4 GM/50ML-% IV SOLN: 1.0000 g | INTRAVENOUS | Status: DC

## 2019-06-09 MED ORDER — ZINC CHLORIDE 1 MG/ML IV SOLN
INTRAVENOUS | Status: AC
  Filled 2019-06-09: qty 1320

## 2019-06-09 MED ORDER — SODIUM CHLORIDE 0.9 % IV SOLN
12.0000 g | INTRAVENOUS | Status: DC
  Administered 2019-06-10 – 2019-06-12 (×4): 12 g via INTRAVENOUS
  Filled 2019-06-09 (×4): qty 12000

## 2019-06-09 MED ORDER — SODIUM CHLORIDE 0.9 % IV SOLN: 12.0000 g | INTRAVENOUS | Status: DC

## 2019-06-09 MED ORDER — CEFAZOLIN SODIUM-DEXTROSE 2-4 GM/100ML-% IV SOLN
2.0000 g | Freq: Two times a day (BID) | INTRAVENOUS | Status: DC
  Administered 2019-06-09: 2 g via INTRAVENOUS
  Filled 2019-06-09: qty 100

## 2019-06-09 MED ORDER — PHENYLEPHRINE HCL-NACL 10-0.9 MG/250ML-% IV SOLN
0.0000 ug/min | INTRAVENOUS | Status: DC
  Administered 2019-06-09: 40 ug/min via INTRAVENOUS
  Administered 2019-06-09: 10 ug/min via INTRAVENOUS
  Administered 2019-06-09 – 2019-06-10 (×3): 20 ug/min via INTRAVENOUS
  Administered 2019-06-11 (×2): 40 ug/min via INTRAVENOUS
  Administered 2019-06-12: 60 ug/min via INTRAVENOUS
  Administered 2019-06-12: 30 ug/min via INTRAVENOUS
  Administered 2019-06-13: 50 ug/min via INTRAVENOUS
  Administered 2019-06-13: 40 ug/min via INTRAVENOUS
  Filled 2019-06-09 (×13): qty 250

## 2019-06-09 NOTE — Progress Notes (Addendum)
Inpatient Diabetes Program Recommendations  AACE/ADA: New Consensus Statement on Inpatient Glycemic Control (2015)  Target Ranges:  Prepandial:   less than 140 mg/dL      Peak postprandial:   less than 180 mg/dL (1-2 hours)      Critically ill patients:  140 - 180 mg/dL   Lab Results  Component Value Date   GLUCAP 245 (H) 06/09/2019    Review of Glycemic Control Results for ADRYEL, WORTMANN (MRN [id number]) as of 06/09/2019 15:53  Ref. Range 07/02/2019 19:57 06/14/2019 23:49 06/09/2019 04:17 06/09/2019 08:39 06/09/2019 11:53  Glucose-Capillary Latest Ref Range: 70 - 99 mg/dL 121 (H) 975 (H) 883 (H) 255 (H) 245 (H)   Diabetes history: None noted Outpatient Diabetes medications: None Current orders for Inpatient glycemic control:  Lantus 15 units daily Novolog resistant q 4 hours  Inpatient Diabetes Program Recommendations:    May consider increasing Lantus to 20 units daily or may want to add insulin to TPN?  Will follow.   Thanks  Beryl Meager, RN, BC-ADM Inpatient Diabetes Coordinator Pager [phone] (8a-5p)
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
## 2019-06-09 NOTE — Progress Notes (Signed)
Patient ID: Ian Velasquez, male   DOB: October 14, 1955, 64 y.o.   MRN: [id number] Follow up - Trauma Critical Care  Patient Details:    Ian Velasquez is an 64 y.o. male.  Lines/tubes : Airway 8 mm (Active)  Secured at (cm) 26 cm 06/09/19 0757  Measured From Lips 06/09/19 0757  Secured Location Left 06/09/19 0757  Secured By [employer] 06/09/19 0757  Tube Holder Repositioned Yes 06/09/19 0757  Cuff Pressure (cm H2O) 28 cm H2O 06/09/19 0757  Site Condition Dry 06/09/19 0757     CVC Triple Lumen 06/02/19 Left Subclavian (Active)  Indication for Insertion or Continuance of Line Administration of hyperosmolar/irritating solutions (i.e. TPN, Vancomycin, etc.) 06/09/19 0726  Site Assessment Dry;Clean;Intact 06/24/2019 2000  Proximal Lumen Status Infusing;Cap changed 06/16/2019 1804  Medial Lumen Status Infusing 07/01/2019 1804  Distal Lumen Status Infusing 06/30/2019 1804  Dressing Type Transparent;Occlusive 06/21/2019 2000  Dressing Status Clean;Intact;Dry;Antimicrobial disc in place 06/24/2019 2000  Line Care Connections checked and tightened 06/24/2019 2000  Dressing Intervention Other (Comment) 06/02/19 2000  Dressing Change Due 06/09/19 06/07/2019 2000     NG/OG Tube Orogastric Left mouth (Active)  Site Assessment Clean;Dry;Intact 06/21/2019 2000  Ongoing Placement Verification No change in cm markings or external length of tube from initial placement;No change in respiratory status;No acute changes, not attributed to clinical condition 06/28/2019 2000  Status Clamped 06/04/2019 2000  Amount of suction 80 mmHg 06/07/19 0800  Drainage Appearance Green;Brown 06/07/19 0800  Intake (mL) 60 mL 06/20/2019 1541  Output (mL) 350 mL 06/07/19 1800     Urethral Catheter Allegra Grana RN 16 Fr. (Active)  Indication for Insertion or Continuance of Catheter Therapy based on hourly urine output monitoring and documentation for critical condition (NOT STRICT I&O) 06/09/19 0739  Site Assessment Clean;Intact;Dry  06/09/19 0740  Catheter Maintenance Bag below level of bladder;Catheter secured;Drainage bag/tubing not touching floor;Seal intact;No dependent loops;Insertion date on drainage bag;Bag emptied prior to transport 06/09/19 0740  Collection Container Standard drainage bag 06/09/19 0740  Securement Method Securing device (Describe) 06/09/19 0740  Urinary Catheter Interventions (if applicable) Unclamped 06/09/19 0740  Output (mL) 5 mL 06/20/2019 1800    Microbiology/Sepsis markers: Results for orders placed or performed during the hospital encounter of 06/16/19  Respiratory Panel by RT PCR (Flu A&B, Covid) - Nasopharyngeal Swab     Status: None   Collection Time: Jun 16, 2019  5:23 PM   Specimen: Nasopharyngeal Swab  Result Value Ref Range Status   SARS Coronavirus 2 by RT PCR NEGATIVE NEGATIVE Final    Comment: (NOTE) SARS-CoV-2 target nucleic acids are NOT DETECTED. The SARS-CoV-2 RNA is generally detectable in upper respiratoy specimens during the acute phase of infection. The lowest concentration of SARS-CoV-2 viral copies this assay can detect is 131 copies/mL. A negative result does not preclude SARS-Cov-2 infection and should not be used as the sole basis for treatment or other patient management decisions. A negative result may occur with  improper specimen collection/handling, submission of specimen other than nasopharyngeal swab, presence of viral mutation(s) within the areas targeted by this assay, and inadequate number of viral copies (<131 copies/mL). A negative result must be combined with clinical observations, patient history, and epidemiological information. The expected result is Negative. Fact Sheet for Patients:  https://www.moore.com/ Fact Sheet for Healthcare Providers:  https://www.young.biz/ This test is not yet ap proved or cleared by the [place] FDA and  has been authorized for detection and/or diagnosis of SARS-CoV-2  by FDA under an Emergency Use Authorization (  EUA). This EUA will remain  in effect (meaning this test can be used) for the duration of the COVID-19 declaration under Section 564(b)(1) of the Act, 21 U.S.C. section 360bbb-3(b)(1), unless the authorization is terminated or revoked sooner.    Influenza A by PCR NEGATIVE NEGATIVE Final   Influenza B by PCR NEGATIVE NEGATIVE Final    Comment: (NOTE) The Xpert Xpress SARS-CoV-2/FLU/RSV assay is intended as an aid in  the diagnosis of influenza from Nasopharyngeal swab specimens and  should not be used as a sole basis for treatment. Nasal washings and  aspirates are unacceptable for Xpert Xpress SARS-CoV-2/FLU/RSV  testing. Fact Sheet for Patients: https://www.moore.com/ Fact Sheet for Healthcare Providers: https://www.young.biz/ This test is not yet approved or cleared by the [place] FDA and  has been authorized for detection and/or diagnosis of SARS-CoV-2 by  FDA under an Emergency Use Authorization (EUA). This EUA will remain  in effect (meaning this test can be used) for the duration of the  Covid-19 declaration under Section 564(b)(1) of the Act, 21  U.S.C. section 360bbb-3(b)(1), unless the authorization is  terminated or revoked. Performed at [facility], [address], [place], [place] [address]   MRSA PCR Screening     Status: None   Collection Time: 06/01/2019  8:16 PM   Specimen: Nasopharyngeal  Result Value Ref Range Status   MRSA by PCR NEGATIVE NEGATIVE Final    Comment:        The GeneXpert MRSA Assay (FDA approved for NASAL specimens only), is one component of a comprehensive MRSA colonization surveillance program. It is not intended to diagnose MRSA infection nor to guide or monitor treatment for MRSA infections. Performed at [facility], [address], [place], [place] [address]   Culture, Urine     Status: Abnormal   Collection Time: 05/30/19  8:44 AM    Specimen: Urine, Clean Catch  Result Value Ref Range Status   Specimen Description URINE, CLEAN CATCH  Final   Special Requests NONE  Final   Culture (A)  Final    <10,000 COLONIES/mL INSIGNIFICANT GROWTH Performed at [facility], [address], [place], [place] [address]    Report Status 05/31/2019 FINAL  Final  Culture, blood (routine x 2)     Status: Abnormal   Collection Time: 05/30/19  9:50 AM   Specimen: BLOOD  Result Value Ref Range Status   Specimen Description BLOOD LEFT ANTECUBITAL  Final   Special Requests   Final    BOTTLES DRAWN AEROBIC ONLY Blood Culture results may not be optimal due to an inadequate volume of blood received in culture bottles   Culture  Setup Time   Final    GRAM POSITIVE COCCI IN CLUSTERS AEROBIC BOTTLE ONLY CRITICAL RESULT CALLED TO, READ BACK BY AND VERIFIED WITH: Melven Sartorius [facility] 05/31/19 0013 JDW Performed at [facility], [address], [place], [place] [address]    Culture STAPHYLOCOCCUS AUREUS (A)  Final   Report Status 06/01/2019 FINAL  Final   Organism ID, Bacteria STAPHYLOCOCCUS AUREUS  Final      Susceptibility   Staphylococcus aureus - MIC*    CIPROFLOXACIN <=0.5 SENSITIVE Sensitive     ERYTHROMYCIN <=0.25 SENSITIVE Sensitive     GENTAMICIN <=0.5 SENSITIVE Sensitive     OXACILLIN 0.5 SENSITIVE Sensitive     TETRACYCLINE <=1 SENSITIVE Sensitive     VANCOMYCIN <=0.5 SENSITIVE Sensitive     TRIMETH/SULFA <=10 SENSITIVE Sensitive  CLINDAMYCIN <=0.25 SENSITIVE Sensitive     RIFAMPIN <=0.5 SENSITIVE Sensitive     Inducible Clindamycin NEGATIVE Sensitive     * STAPHYLOCOCCUS AUREUS  Blood Culture ID Panel (Reflexed)     Status: Abnormal   Collection Time: 05/30/19  9:50 AM  Result Value Ref Range Status   Enterococcus species NOT DETECTED NOT DETECTED Final   Listeria monocytogenes NOT DETECTED NOT DETECTED Final   Staphylococcus species DETECTED (A) NOT DETECTED Final    Comment: CRITICAL RESULT CALLED TO, READ  BACK BY AND VERIFIED WITH: J LEDFORD PHARMD 05/31/19 0013 JDW    Staphylococcus aureus (BCID) DETECTED (A) NOT DETECTED Final    Comment: Methicillin (oxacillin) susceptible Staphylococcus aureus (MSSA). Preferred therapy is anti staphylococcal beta lactam antibiotic (Cefazolin or Nafcillin), unless clinically contraindicated. CRITICAL RESULT CALLED TO, READ BACK BY AND VERIFIED WITH: J LEDFORD [facility] 05/31/19 0013 JDW    Methicillin resistance NOT DETECTED NOT DETECTED Final   Streptococcus species NOT DETECTED NOT DETECTED Final   Streptococcus agalactiae NOT DETECTED NOT DETECTED Final   Streptococcus pneumoniae NOT DETECTED NOT DETECTED Final   Streptococcus pyogenes NOT DETECTED NOT DETECTED Final   Acinetobacter baumannii NOT DETECTED NOT DETECTED Final   Enterobacteriaceae species NOT DETECTED NOT DETECTED Final   Enterobacter cloacae complex NOT DETECTED NOT DETECTED Final   Escherichia coli NOT DETECTED NOT DETECTED Final   Klebsiella oxytoca NOT DETECTED NOT DETECTED Final   Klebsiella pneumoniae NOT DETECTED NOT DETECTED Final   Proteus species NOT DETECTED NOT DETECTED Final   Serratia marcescens NOT DETECTED NOT DETECTED Final   Haemophilus influenzae NOT DETECTED NOT DETECTED Final   Neisseria meningitidis NOT DETECTED NOT DETECTED Final   Pseudomonas aeruginosa NOT DETECTED NOT DETECTED Final   Candida albicans NOT DETECTED NOT DETECTED Final   Candida glabrata NOT DETECTED NOT DETECTED Final   Candida krusei NOT DETECTED NOT DETECTED Final   Candida parapsilosis NOT DETECTED NOT DETECTED Final   Candida tropicalis NOT DETECTED NOT DETECTED Final    Comment: Performed at [facility], [address], [place], [place] [address]  Culture, blood (routine x 2)     Status: Abnormal   Collection Time: 05/30/19  9:55 AM   Specimen: BLOOD  Result Value Ref Range Status   Specimen Description BLOOD LEFT ANTECUBITAL  Final   Special Requests   Final    BOTTLES DRAWN  AEROBIC ONLY Blood Culture results may not be optimal due to an inadequate volume of blood received in culture bottles   Culture  Setup Time   Final    AEROBIC BOTTLE ONLY GRAM POSITIVE COCCI IN CLUSTERS CRITICAL VALUE NOTED.  VALUE IS CONSISTENT WITH PREVIOUSLY REPORTED AND CALLED VALUE.    Culture (A)  Final    STAPHYLOCOCCUS AUREUS SUSCEPTIBILITIES PERFORMED ON PREVIOUS CULTURE WITHIN THE LAST 5 DAYS. Performed at [facility], [address], [place], [place] [address]    Report Status 06/01/2019 FINAL  Final  Culture, blood (routine x 2)     Status: None   Collection Time: 05/31/19  7:12 AM   Specimen: BLOOD RIGHT HAND  Result Value Ref Range Status   Specimen Description BLOOD RIGHT HAND  Final   Special Requests   Final    BOTTLES DRAWN AEROBIC ONLY Blood Culture results may not be optimal due to an inadequate volume of blood received in culture bottles   Culture   Final    NO GROWTH 5 DAYS Performed at [facility], [address] [place], [place] [address]    Report Status 06/05/2019 FINAL  Final  Culture, blood (routine x 2)     Status: None   Collection Time: 05/31/19  7:45 AM   Specimen: BLOOD LEFT ARM  Result Value Ref Range Status   Specimen Description BLOOD LEFT ARM  Final   Special Requests   Final    BOTTLES DRAWN AEROBIC ONLY Blood Culture results may not be optimal due to an inadequate volume of blood received in culture bottles   Culture   Final    NO GROWTH 5 DAYS Performed at [facility], [address] [place], [place] [address]    Report Status 06/05/2019 FINAL  Final  Culture, respiratory (non-expectorated)     Status: None   Collection Time: 06/02/19  9:17 AM   Specimen: Tracheal Aspirate; Respiratory  Result Value Ref Range Status   Specimen Description TRACHEAL ASPIRATE  Final   Special Requests Normal  Final   Gram Stain   Final    NO WBC SEEN ABUNDANT GRAM NEGATIVE RODS RARE YEAST Performed at [facility],  [address] [place], [place] [address]    Culture ABUNDANT ESCHERICHIA COLI  Final   Report Status 06/04/2019 FINAL  Final   Organism ID, Bacteria ESCHERICHIA COLI  Final      Susceptibility   Escherichia coli - MIC*    AMPICILLIN >=32 RESISTANT Resistant     CEFAZOLIN 8 SENSITIVE Sensitive     CEFEPIME <=1 SENSITIVE Sensitive     CEFTAZIDIME <=1 SENSITIVE Sensitive     CEFTRIAXONE <=1 SENSITIVE Sensitive     CIPROFLOXACIN 1 SENSITIVE Sensitive     GENTAMICIN <=1 SENSITIVE Sensitive     IMIPENEM <=0.25 SENSITIVE Sensitive     TRIMETH/SULFA <=20 SENSITIVE Sensitive     AMPICILLIN/SULBACTAM >=32 RESISTANT Resistant     PIP/TAZO 64 INTERMEDIATE Intermediate     * ABUNDANT ESCHERICHIA COLI    Anti-infectives:  Anti-infectives (From admission, onward)   Start     Dose/Rate Route Frequency Ordered Stop   06/09/19 2124  ceFAZolin (ANCEF) IVPB 1 g/50 mL premix     1 g 100 mL/hr over 30 Minutes Intravenous Every 24 hours 06/09/19 0746     06/05/19 2030  ceFAZolin (ANCEF) IVPB 2g/100 mL premix  Status:  Discontinued     2 g 200 mL/hr over 30 Minutes Intravenous Every 12 hours 06/05/19 0905 06/09/19 0746   06/03/19 2300  ceFEPIme (MAXIPIME) 2 g in sodium chloride 0.9 % 100 mL IVPB  Status:  Discontinued     2 g 200 mL/hr over 30 Minutes Intravenous Every 24 hours 06/03/19 0641 06/03/19 1831   06/03/19 2000  ceFEPIme (MAXIPIME) 2 g in sodium chloride 0.9 % 100 mL IVPB  Status:  Discontinued     2 g 200 mL/hr over 30 Minutes Intravenous Every 12 hours 06/03/19 1830 06/05/19 0905   06/02/19 1000  clindamycin (CLEOCIN) IVPB 600 mg  Status:  Discontinued     600 mg 100 mL/hr over 30 Minutes Intravenous Every 8 hours 06/02/19 0142 06/02/19 0812   06/02/19 0830  ceFEPIme (MAXIPIME) 2 g in sodium chloride 0.9 % 100 mL IVPB  Status:  Discontinued     2 g 200 mL/hr over 30 Minutes Intravenous Every 12 hours 06/02/19 0812 06/03/19 0641   06/02/19 0815  metroNIDAZOLE (FLAGYL) IVPB 500 mg  Status:   Discontinued     500 mg 100 mL/hr over 60  Minutes Intravenous Every 8 hours 06/02/19 0812 06/05/19 0905   06/02/19 0200  clindamycin (CLEOCIN) IVPB 600 mg     600 mg 100 mL/hr over 30 Minutes Intravenous  Once 06/02/19 0142 06/02/19 0254   05/31/19 1330  nafcillin 12 g in sodium chloride 0.9 % 500 mL continuous infusion  Status:  Discontinued     12 g 20.8 mL/hr over 24 Hours Intravenous Every 24 hours 05/31/19 1052 06/02/19 0812   05/31/19 0200  nafcillin 2 g in sodium chloride 0.9 % 100 mL IVPB  Status:  Discontinued     2 g 200 mL/hr over 30 Minutes Intravenous Every 4 hours 05/31/19 0055 05/31/19 1052   05/31/19 0115  nafcillin injection 2 g  Status:  Discontinued     2 g Intravenous Every 4 hours 05/31/19 0027 05/31/19 0054      Best Practice/Protocols:  VTE Prophylaxis: Heparin (SQ) Continous Sedation  Consults: Treatment Team:  Jadene Pierini, MD Yolonda Kida, MD Lbcardiology, [place], MD Estanislado Emms, MD    Studies:    Events:  Subjective:    Overnight Issues:   Objective:  Vital signs for last 24 hours: Temp:  [98.1 F (36.7 C)-99.9 F (37.7 C)] 99.9 F (37.7 C) (05/06 0830) Pulse Rate:  [97-112] 109 (05/06 0830) Resp:  [17-39] 30 (05/06 0830) BP: (78-136)/(50-102) 129/69 (05/06 0830) SpO2:  [94 %-100 %] 98 % (05/06 0830) FiO2 (%):  [40 %-50 %] 40 % (05/06 0830) Weight:  [829 kg] 108 kg (05/06 0448)  Hemodynamic parameters for last 24 hours:    Intake/Output from previous day: 05/05 0701 - 05/06 0700 In: 2331.2 [I.V.:1882; NG/GT:249; IV Piggyback:200.1] Out: 5020 [Urine:40]  Intake/Output this shift: No intake/output data recorded.  Vent settings for last 24 hours: Vent Mode: PRVC FiO2 (%):  [40 %-50 %] 40 % Set Rate:  [20 bmp] 20 bmp Vt Set:  [562 mL] 670 mL PEEP:  [5 cmH20-8 cmH20] 5 cmH20 Plateau Pressure:  [15 cmH20-30 cmH20] 22 cmH20  Physical Exam:  General: on vent Neuro: unresponsive HEENT/Neck: ETT Resp:  clear to auscultation bilaterally CVS: RRR GI: soft, less distended, few BS Extremities: mild edema Jaundice  Results for orders placed or performed during the hospital encounter of 05/18/2019 (from the past 24 hour(s))  Glucose, capillary     Status: Abnormal   Collection Time: 07/03/2019 11:53 AM  Result Value Ref Range   Glucose-Capillary 193 (H) 70 - 99 mg/dL   Comment 1 Notify RN    Comment 2 Document in Chart   Glucose, capillary     Status: Abnormal   Collection Time: 2019/07/03  3:24 PM  Result Value Ref Range   Glucose-Capillary 200 (H) 70 - 99 mg/dL   Comment 1 Notify RN    Comment 2 Document in Chart   Renal function panel (daily at 1600)     Status: Abnormal   Collection Time: Jul 03, 2019  3:50 PM  Result Value Ref Range   Sodium 137 135 - 145 mmol/L   Potassium 4.0 3.5 - 5.1 mmol/L   Chloride 99 98 - 111 mmol/L   CO2 23 22 - 32 mmol/L   Glucose, Bld 208 (H) 70 - 99 mg/dL   BUN 72 (H) 8 - 23 mg/dL   Creatinine, Ser 1.30 (H) 0.61 - 1.24 mg/dL   Calcium 8.6 (L) 8.9 - 10.3 mg/dL   Phosphorus 3.7 2.5 - 4.6 mg/dL   Albumin 2.2 (L) 3.5 - 5.0 g/dL   GFR calc  non Af Amer 28 (L) >60 mL/min   GFR calc Af Amer 32 (L) >60 mL/min   Anion gap 15 5 - 15  Glucose, capillary     Status: Abnormal   Collection Time: 06/26/2019  7:57 PM  Result Value Ref Range   Glucose-Capillary 203 (H) 70 - 99 mg/dL  Glucose, capillary     Status: Abnormal   Collection Time: 06/22/2019 11:49 PM  Result Value Ref Range   Glucose-Capillary 233 (H) 70 - 99 mg/dL  Glucose, capillary     Status: Abnormal   Collection Time: 06/09/19  4:17 AM  Result Value Ref Range   Glucose-Capillary 222 (H) 70 - 99 mg/dL  Comprehensive metabolic panel     Status: Abnormal   Collection Time: 06/09/19  5:39 AM  Result Value Ref Range   Sodium 133 (L) 135 - 145 mmol/L   Potassium 4.9 3.5 - 5.1 mmol/L   Chloride 94 (L) 98 - 111 mmol/L   CO2 19 (L) 22 - 32 mmol/L   Glucose, Bld 307 (H) 70 - 99 mg/dL   BUN 829 (H) 8 - 23  mg/dL   Creatinine, Ser 5.62 (H) 0.61 - 1.24 mg/dL   Calcium 8.4 (L) 8.9 - 10.3 mg/dL   Total Protein 7.3 6.5 - 8.1 g/dL   Albumin 2.2 (L) 3.5 - 5.0 g/dL   AST 82 (H) 15 - 41 U/L   ALT 36 0 - 44 U/L   Alkaline Phosphatase 152 (H) 38 - 126 U/L   Total Bilirubin 21.0 (HH) 0.3 - 1.2 mg/dL   GFR calc non Af Amer 17 (L) >60 mL/min   GFR calc Af Amer 20 (L) >60 mL/min   Anion gap 20 (H) 5 - 15  Magnesium     Status: Abnormal   Collection Time: 06/09/19  5:39 AM  Result Value Ref Range   Magnesium 3.0 (H) 1.7 - 2.4 mg/dL  CBC     Status: Abnormal   Collection Time: 06/09/19  5:39 AM  Result Value Ref Range   WBC 21.1 (H) 4.0 - 10.5 K/uL   RBC 3.66 (L) 4.22 - 5.81 MIL/uL   Hemoglobin 10.6 (L) 13.0 - 17.0 g/dL   HCT 13.0 (L) 86.5 - 78.4 %   MCV 97.3 80.0 - 100.0 fL   MCH 29.0 26.0 - 34.0 pg   MCHC 29.8 (L) 30.0 - 36.0 g/dL   RDW 69.6 (H) 29.5 - 28.4 %   Platelets 316 150 - 400 K/uL   nRBC 7.1 (H) 0.0 - 0.2 %  Phosphorus     Status: Abnormal   Collection Time: 06/09/19  5:39 AM  Result Value Ref Range   Phosphorus 5.3 (H) 2.5 - 4.6 mg/dL  Glucose, capillary     Status: Abnormal   Collection Time: 06/09/19  8:39 AM  Result Value Ref Range   Glucose-Capillary 255 (H) 70 - 99 mg/dL    Assessment & Plan: Present on Admission: . Closed displaced fracture of body of left scapula . Traumatic brain injury with loss of consciousness (HCC) . OSA (obstructive sleep apnea) . RLS (restless legs syndrome) . Essential hypertension . Acute blood loss anemia . Multiple trauma . Tachycardia . Multiple closed fractures of ribs of left side . Abdominal distention . Closed left scapular fracture    LOS: 16 days   Additional comments:I reviewed the patient's new clinical lab test results. . M[facility]  Acute hypoxic respiratory failurewith ARDS- likely due to aspiration. Improving - down to 40%  and PEEP 5, RR remains high if on full support or weaning SAH, concussion- NSGY c/s (Dr.  Grier Mitts head4/21, Blanco x7d for sz ppx,MR this AM showed TBI as expected. Off sedation and not responsive - follow closely with likely prolonged drug clearance. Comminuted left scapula fx- slingfor now, orthoc/s (Dr. 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 TTE negative for endocarditis, TEE done but not read, appreciate ID F/U, they may change to Nafcillin today P TEE. No brain abscess on MR. Aspiration PNA- completed ABC for PNA 5/5 Septic shock- off steroids, back on Neo, albumin bolus Gluteal and possible latissimus dorsi hematoma - possibly the cause of bacteremia. As above Malnutrition - TNA Hyperbilirubinemia- likely reabsorption of scattered hematomas. AKI - CRRT off per Renal. CRT and K up some this AM. FEN -held TF for severe ileus,+BS, but no BM, on TPN, continue trickle TF  Hyperglycemia- on resistant SSI regimen 28u/24h, also on lantus 15.  DVT - SCDs,SQH (AKI) Dispo - ICU Critical Care Total Time*: 11 Minutes  Georganna Skeans, MD, MPH, FACS Trauma & General Surgery Use [email or web address] to contact on call provider  06/09/2019  *Care during the described time interval was provided by me. I have reviewed this patient's available data, including medical history, events of note, physical examination and test results as part of my evaluation.
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
## 2019-06-09 NOTE — Progress Notes (Addendum)
Patient transported to MRI and back to 4N26 without complications.  

## 2019-06-09 NOTE — Progress Notes (Signed)
  Echocardiogram Echocardiogram Transesophageal has been performed.  Ian Velasquez 06/09/2019, 9:03 AM

## 2019-06-09 NOTE — Progress Notes (Signed)
CRITICAL VALUE ALERT  Critical Value:  Total Bilirubin 21.0 and Cr 3.55  Date & Time Notied:  5/6 0800  Provider Notified: Dr. Janee Morn  Orders Received/Actions taken: No new orders at this time.
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
## 2019-06-09 NOTE — Progress Notes (Signed)
Dr. Donell Beers paged. Patient is tachypnic and not tolerating vent, with eyes open. Patient's blood pressure is also low, currently 84/56. New orders received for phenylephrine infusion and a one time dose 2 mg ativan IV, which may be repeated in one hour if needed.

## 2019-06-09 NOTE — Progress Notes (Signed)
SLP Cancellation Note  Patient Details Name: Ian Velasquez MRN: [id number] DOB: 11-17-1955   Cancelled treatment:       Reason Eval/Treat Not Completed: Medical issues which prohibited therapy (remains on vent)    Mahala Menghini., M.A. CCC-SLP Acute Rehabilitation Services Pager [phone] Office [phone]  06/09/2019, 10:32 AM
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
## 2019-06-09 NOTE — Progress Notes (Signed)
   Consent obtained from wife for TEE. Risks and benefits explained. Willing to proceed to exclude endocarditis. Now on pressors. Creat increased. Increased risk.  Donato Schultz, MD

## 2019-06-09 NOTE — Progress Notes (Signed)
Inpatient Rehab Admissions Coordinator:   Discussed in trauma rounds.  MD will reconsult CIR pending clinical improvement.  Will sign off at this time.   Estill Dooms, PT, DPT Admissions Coordinator [phone] 06/09/19  2:40 PM

## 2019-06-09 NOTE — CV Procedure (Signed)
   Transesophageal Echocardiogram  Indications: Bacteremia  Time out performed  During this procedure the patient is administered a total of Versed 2 mg and Fentanyl 50 mcg at 4:30am to achieve and maintain moderate conscious sedation. Did not require any more sedation. ETT tube in place.  The patient's heart rate, blood pressure, and oxygen saturation are monitored continuously during the procedure. The period of conscious sedation is 20 minutes, of which I was present face-to-face 100% of this time.  Findings:  Left Ventricle: Normal EF  Mitral Valve: Normal, no MR  Aortic Valve: Trileaflet, moderate central aortic regurgitation  Tricuspid Valve: Normal no TR  Left Atrium: Normal, no left atrial appendage thrombus  IMPRESSION: NO signs of ENDOCARDITIS, NO VEGETATIONS.  Donato Schultz, MD

## 2019-06-10 ENCOUNTER — Inpatient Hospital Stay (HOSPITAL_COMMUNITY): Payer: PRIVATE HEALTH INSURANCE

## 2019-06-10 LAB — RENAL FUNCTION PANEL
Albumin: 2.3 g/dL — ABNORMAL LOW (ref 3.5–5.0)
Albumin: 2.1 g/dL — ABNORMAL LOW (ref 3.5–5.0)
Anion gap: 18 — ABNORMAL HIGH (ref 5–15)
Anion gap: 17 — ABNORMAL HIGH (ref 5–15)
BUN: 97 mg/dL — ABNORMAL HIGH (ref 8–23)
BUN: 95 mg/dL — ABNORMAL HIGH (ref 8–23)
CO2: 21 mmol/L — ABNORMAL LOW (ref 22–32)
CO2: 22 mmol/L (ref 22–32)
Calcium: 8.3 mg/dL — ABNORMAL LOW (ref 8.9–10.3)
Calcium: 8.2 mg/dL — ABNORMAL LOW (ref 8.9–10.3)
Chloride: 97 mmol/L — ABNORMAL LOW (ref 98–111)
Chloride: 97 mmol/L — ABNORMAL LOW (ref 98–111)
Creatinine, Ser: 2.94 mg/dL — ABNORMAL HIGH (ref 0.61–1.24)
Creatinine, Ser: 2.73 mg/dL — ABNORMAL HIGH (ref 0.61–1.24)
GFR calc Af Amer: 25 mL/min — ABNORMAL LOW (ref >60)
GFR calc Af Amer: 27 mL/min — ABNORMAL LOW (ref >60)
GFR calc non Af Amer: 21 mL/min — ABNORMAL LOW (ref >60)
GFR calc non Af Amer: 24 mL/min — ABNORMAL LOW (ref >60)
Glucose, Bld: 277 mg/dL — ABNORMAL HIGH (ref 70–99)
Glucose, Bld: 248 mg/dL — ABNORMAL HIGH (ref 70–99)
Phosphorus: 6.7 mg/dL — ABNORMAL HIGH (ref 2.5–4.6)
Phosphorus: 9.3 mg/dL — ABNORMAL HIGH (ref 2.5–4.6)
Potassium: 4.5 mmol/L (ref 3.5–5.1)
Potassium: 4.5 mmol/L (ref 3.5–5.1)
Sodium: 136 mmol/L (ref 135–145)
Sodium: 136 mmol/L (ref 135–145)

## 2019-06-10 LAB — CBC
HCT: 35.6 % — ABNORMAL LOW (ref 39.0–52.0)
Hemoglobin: 10.4 g/dL — ABNORMAL LOW (ref 13.0–17.0)
MCH: 30.1 pg (ref 26.0–34.0)
MCHC: 29.2 g/dL — ABNORMAL LOW (ref 30.0–36.0)
MCV: 103.2 fL — ABNORMAL HIGH (ref 80.0–100.0)
Platelets: 305 10*3/uL (ref 150–400)
RBC: 3.45 MIL/uL — ABNORMAL LOW (ref 4.22–5.81)
RDW: 22.7 % — ABNORMAL HIGH (ref 11.5–15.5)
WBC: 22.8 10*3/uL — ABNORMAL HIGH (ref 4.0–10.5)
nRBC: 0.9 % — ABNORMAL HIGH (ref 0.0–0.2)

## 2019-06-10 LAB — MAGNESIUM: Magnesium: 2.8 mg/dL — ABNORMAL HIGH (ref 1.7–2.4)

## 2019-06-10 LAB — GLUCOSE, CAPILLARY
Glucose-Capillary: 195 mg/dL — ABNORMAL HIGH (ref 70–99)
Glucose-Capillary: 209 mg/dL — ABNORMAL HIGH (ref 70–99)
Glucose-Capillary: 215 mg/dL — ABNORMAL HIGH (ref 70–99)
Glucose-Capillary: 215 mg/dL — ABNORMAL HIGH (ref 70–99)
Glucose-Capillary: 225 mg/dL — ABNORMAL HIGH (ref 70–99)
Glucose-Capillary: 237 mg/dL — ABNORMAL HIGH (ref 70–99)

## 2019-06-10 LAB — PHOSPHORUS: Phosphorus: 7.1 mg/dL — ABNORMAL HIGH (ref 2.5–4.6)

## 2019-06-10 MED ORDER — INSULIN GLARGINE 100 UNIT/ML ~~LOC~~ SOLN
20.0000 [IU] | Freq: Two times a day (BID) | SUBCUTANEOUS | Status: DC
  Administered 2019-06-10 (×2): 20 [IU] via SUBCUTANEOUS
  Filled 2019-06-10 (×4): qty 0.2

## 2019-06-10 MED ORDER — INSULIN GLARGINE 100 UNIT/ML ~~LOC~~ SOLN
25.0000 [IU] | Freq: Every day | SUBCUTANEOUS | Status: DC
  Filled 2019-06-10: qty 0.25

## 2019-06-10 MED ORDER — ZINC CHLORIDE 1 MG/ML IV SOLN
INTRAVENOUS | Status: AC
  Filled 2019-06-10: qty 1318.4
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
## 2019-06-10 NOTE — Progress Notes (Signed)
Patient ID: Ian Velasquez, male   DOB: March 11, 1955, 64 y.o.   MRN: [id number] Follow up - Trauma Critical Care  Patient Details:    Ian Velasquez is an 64 y.o. male.  Lines/tubes : Airway 8 mm (Active)  Secured at (cm) 26 cm 06/10/19 0749  Measured From Lips 06/10/19 0749  Secured Location Right 06/10/19 0749  Secured By [employer] 06/10/19 0749  Tube Holder Repositioned Yes 06/10/19 0749  Cuff Pressure (cm H2O) 30 cm H2O 06/10/19 0749  Site Condition Dry 06/10/19 0749     CVC Triple Lumen 06/02/19 Left Subclavian (Active)  Indication for Insertion or Continuance of Line Administration of hyperosmolar/irritating solutions (i.e. TPN, Vancomycin, etc.) 06/10/19 0800  Site Assessment Dry;Clean;Intact 06/10/19 0800  Proximal Lumen Status Infusing 06/09/19 0800  Medial Lumen Status Infusing 06/09/19 0800  Distal Lumen Status Infusing 06/09/19 0800  Dressing Type Transparent;Occlusive 06/10/19 0800  Dressing Status Clean;Intact;Dry;Antimicrobial disc in place 06/10/19 0800  Line Care Connections checked and tightened 06/10/19 0800  Dressing Intervention New dressing 06/09/19 0610  Dressing Change Due 06/16/19 06/10/19 0800     NG/OG Tube Orogastric Left mouth (Active)  Site Assessment Clean;Dry;Intact 06/10/19 0800  Ongoing Placement Verification No change in cm markings or external length of tube from initial placement;No change in respiratory status;No acute changes, not attributed to clinical condition 06/09/19 2000  Status Infusing tube feed 06/10/19 0800  Amount of suction 80 mmHg 06/07/19 0800  Drainage Appearance Green;Brown 06/07/19 0800  Intake (mL) 60 mL Jun 11, 2019 1541  Output (mL) 350 mL 06/07/19 1800    Microbiology/Sepsis markers: Results for orders placed or performed during the hospital encounter of 05/27/2019  Respiratory Panel by RT PCR (Flu A&B, Covid) - Nasopharyngeal Swab     Status: None   Collection Time: 05/20/2019  5:23 PM   Specimen: Nasopharyngeal Swab   Result Value Ref Range Status   SARS Coronavirus 2 by RT PCR NEGATIVE NEGATIVE Final    Comment: (NOTE) SARS-CoV-2 target nucleic acids are NOT DETECTED. The SARS-CoV-2 RNA is generally detectable in upper respiratoy specimens during the acute phase of infection. The lowest concentration of SARS-CoV-2 viral copies this assay can detect is 131 copies/mL. A negative result does not preclude SARS-Cov-2 infection and should not be used as the sole basis for treatment or other patient management decisions. A negative result may occur with  improper specimen collection/handling, submission of specimen other than nasopharyngeal swab, presence of viral mutation(s) within the areas targeted by this assay, and inadequate number of viral copies (<131 copies/mL). A negative result must be combined with clinical observations, patient history, and epidemiological information. The expected result is Negative. Fact Sheet for Patients:  https://www.moore.com/ Fact Sheet for Healthcare Providers:  https://www.young.biz/ This test is not yet ap proved or cleared by the [place] FDA and  has been authorized for detection and/or diagnosis of SARS-CoV-2 by FDA under an Emergency Use Authorization (EUA). This EUA will remain  in effect (meaning this test can be used) for the duration of the COVID-19 declaration under Section 564(b)(1) of the Act, 21 U.S.C. section 360bbb-3(b)(1), unless the authorization is terminated or revoked sooner.    Influenza A by PCR NEGATIVE NEGATIVE Final   Influenza B by PCR NEGATIVE NEGATIVE Final    Comment: (NOTE) The Xpert Xpress SARS-CoV-2/FLU/RSV assay is intended as an aid in  the diagnosis of influenza from Nasopharyngeal swab specimens and  should not be used as a sole basis for treatment. Nasal washings and  aspirates are  unacceptable for Xpert Xpress SARS-CoV-2/FLU/RSV  testing. Fact Sheet for  Patients: https://www.moore.com/ Fact Sheet for Healthcare Providers: https://www.young.biz/ This test is not yet approved or cleared by the [place] FDA and  has been authorized for detection and/or diagnosis of SARS-CoV-2 by  FDA under an Emergency Use Authorization (EUA). This EUA will remain  in effect (meaning this test can be used) for the duration of the  Covid-19 declaration under Section 564(b)(1) of the Act, 21  U.S.C. section 360bbb-3(b)(1), unless the authorization is  terminated or revoked. Performed at [facility], [address], [place], [place] [address]   MRSA PCR Screening     Status: None   Collection Time: Jun 23, 2019  8:16 PM   Specimen: Nasopharyngeal  Result Value Ref Range Status   MRSA by PCR NEGATIVE NEGATIVE Final    Comment:        The GeneXpert MRSA Assay (FDA approved for NASAL specimens only), is one component of a comprehensive MRSA colonization surveillance program. It is not intended to diagnose MRSA infection nor to guide or monitor treatment for MRSA infections. Performed at [facility], [address] [place], [place] [address]   Culture, Urine     Status: Abnormal   Collection Time: 05/30/19  8:44 AM   Specimen: Urine, Clean Catch  Result Value Ref Range Status   Specimen Description URINE, CLEAN CATCH  Final   Special Requests NONE  Final   Culture (A)  Final    <10,000 COLONIES/mL INSIGNIFICANT GROWTH Performed at [facility], [address] [place], [place] [address]    Report Status 05/31/2019 FINAL  Final  Culture, blood (routine x 2)     Status: Abnormal   Collection Time: 05/30/19  9:50 AM   Specimen: BLOOD  Result Value Ref Range Status   Specimen Description BLOOD LEFT ANTECUBITAL  Final   Special Requests   Final    BOTTLES DRAWN AEROBIC ONLY Blood Culture results may not be optimal due to an inadequate volume of blood received in culture bottles   Culture   Setup Time   Final    GRAM POSITIVE COCCI IN CLUSTERS AEROBIC BOTTLE ONLY CRITICAL RESULT CALLED TO, READ BACK BY AND VERIFIED WITH: Melven [facility]orius [facility] 05/31/19 0013 JDW Performed at [facility], [address], [place], [place] [address]    Culture STAPHYLOCOCCUS AUREUS (A)  Final   Report Status 06/01/2019 FINAL  Final   Organism ID, Bacteria STAPHYLOCOCCUS AUREUS  Final      Susceptibility   Staphylococcus aureus - MIC*    CIPROFLOXACIN <=0.5 SENSITIVE Sensitive     ERYTHROMYCIN <=0.25 SENSITIVE Sensitive     GENTAMICIN <=0.5 SENSITIVE Sensitive     OXACILLIN 0.5 SENSITIVE Sensitive     TETRACYCLINE <=1 SENSITIVE Sensitive     VANCOMYCIN <=0.5 SENSITIVE Sensitive     TRIMETH/SULFA <=10 SENSITIVE Sensitive     CLINDAMYCIN <=0.25 SENSITIVE Sensitive     RIFAMPIN <=0.5 SENSITIVE Sensitive     Inducible Clindamycin NEGATIVE Sensitive     * STAPHYLOCOCCUS AUREUS  Blood Culture ID Panel (Reflexed)     Status: Abnormal   Collection Time: 05/30/19  9:50 AM  Result Value Ref Range Status   Enterococcus species NOT DETECTED NOT DETECTED Final   Listeria monocytogenes NOT DETECTED NOT DETECTED Final   Staphylococcus species DETECTED (A) NOT DETECTED Final    Comment: CRITICAL RESULT CALLED TO, READ BACK BY AND VERIFIED WITH: J [facility]AR[facility] 05/31/19  0013 JDW    Staphylococcus aureus (BCID) DETECTED (A) NOT DETECTED Final    Comment: Methicillin (oxacillin) susceptible Staphylococcus aureus (MSSA). Preferred therapy is anti staphylococcal beta lactam antibiotic (Cefazolin or Nafcillin), unless clinically contraindicated. CRITICAL RESULT CALLED TO, READ BACK BY AND VERIFIED WITH: J [facility] 05/31/19 0013 JDW    Methicillin resistance NOT DETECTED NOT DETECTED Final   Streptococcus species NOT DETECTED NOT DETECTED Final   Streptococcus agalactiae NOT DETECTED NOT DETECTED Final   Streptococcus pneumoniae NOT DETECTED NOT DETECTED Final   Streptococcus pyogenes NOT DETECTED NOT  DETECTED Final   Acinetobacter baumannii NOT DETECTED NOT DETECTED Final   Enterobacteriaceae species NOT DETECTED NOT DETECTED Final   Enterobacter cloacae complex NOT DETECTED NOT DETECTED Final   Escherichia coli NOT DETECTED NOT DETECTED Final   Klebsiella oxytoca NOT DETECTED NOT DETECTED Final   Klebsiella pneumoniae NOT DETECTED NOT DETECTED Final   Proteus species NOT DETECTED NOT DETECTED Final   Serratia marcescens NOT DETECTED NOT DETECTED Final   Haemophilus influenzae NOT DETECTED NOT DETECTED Final   Neisseria meningitidis NOT DETECTED NOT DETECTED Final   Pseudomonas aeruginosa NOT DETECTED NOT DETECTED Final   Candida albicans NOT DETECTED NOT DETECTED Final   Candida glabrata NOT DETECTED NOT DETECTED Final   Candida krusei NOT DETECTED NOT DETECTED Final   Candida parapsilosis NOT DETECTED NOT DETECTED Final   Candida tropicalis NOT DETECTED NOT DETECTED Final    Comment: Performed at [facility], [place] [address] [place], [place] [address]  Culture, blood (routine x 2)     Status: Abnormal   Collection Time: 05/30/19  9:55 AM   Specimen: BLOOD  Result Value Ref Range Status   Specimen Description BLOOD LEFT ANTECUBITAL  Final   Special Requests   Final    BOTTLES DRAWN AEROBIC ONLY Blood Culture results may not be optimal due to an inadequate volume of blood received in culture bottles   Culture  Setup Time   Final    AEROBIC BOTTLE ONLY GRAM POSITIVE COCCI IN CLUSTERS CRITICAL VALUE NOTED.  VALUE IS CONSISTENT WITH PREVIOUSLY REPORTED AND CALLED VALUE.    Culture (A)  Final    STAPHYLOCOCCUS AUREUS SUSCEPTIBILITIES PERFORMED ON PREVIOUS CULTURE WITHIN THE LAST 5 DAYS. Performed at [facility], [place] [address] [place], [place] [address]    Report Status 06/01/2019 FINAL  Final  Culture, blood (routine x 2)     Status: None   Collection Time: 05/31/19  7:12 AM   Specimen: BLOOD RIGHT HAND  Result Value Ref Range Status   Specimen Description BLOOD  RIGHT HAND  Final   Special Requests   Final    BOTTLES DRAWN AEROBIC ONLY Blood Culture results may not be optimal due to an inadequate volume of blood received in culture bottles   Culture   Final    NO GROWTH 5 DAYS Performed at [facility], [place] [address], [place],  [address]    Report Status 06/05/2019 FINAL  Final  Culture, blood (routine x 2)     Status: None   Collection Time: 05/31/19  7:45 AM   Specimen: BLOOD LEFT ARM  Result Value Ref Range Status   Specimen Description BLOOD LEFT ARM  Final   Special Requests   Final    BOTTLES DRAWN AEROBIC ONLY Blood Culture results may not be optimal due to an inadequate volume of blood received in culture bottles   Culture   Final  NO GROWTH 5 DAYS Performed at [facility], [address], [place], [place] [address]    Report Status 06/05/2019 FINAL  Final  Culture, respiratory (non-expectorated)     Status: None   Collection Time: 06/02/19  9:17 AM   Specimen: Tracheal Aspirate; Respiratory  Result Value Ref Range Status   Specimen Description TRACHEAL ASPIRATE  Final   Special Requests Normal  Final   Gram Stain   Final    NO WBC SEEN ABUNDANT GRAM NEGATIVE RODS RARE YEAST Performed at [facility], [address], [place], [place] [address]    Culture ABUNDANT ESCHERICHIA COLI  Final   Report Status 06/04/2019 FINAL  Final   Organism ID, Bacteria ESCHERICHIA COLI  Final      Susceptibility   Escherichia coli - MIC*    AMPICILLIN >=32 RESISTANT Resistant     CEFAZOLIN 8 SENSITIVE Sensitive     CEFEPIME <=1 SENSITIVE Sensitive     CEFTAZIDIME <=1 SENSITIVE Sensitive     CEFTRIAXONE <=1 SENSITIVE Sensitive     CIPROFLOXACIN 1 SENSITIVE Sensitive     GENTAMICIN <=1 SENSITIVE Sensitive     IMIPENEM <=0.25 SENSITIVE Sensitive     TRIMETH/SULFA <=20 SENSITIVE Sensitive     AMPICILLIN/SULBACTAM >=32 RESISTANT Resistant     PIP/TAZO 64 INTERMEDIATE Intermediate     * ABUNDANT ESCHERICHIA COLI     Anti-infectives:  Anti-infectives (From admission, onward)   Start     Dose/Rate Route Frequency Ordered Stop   06/10/19 1400  nafcillin 12 g in sodium chloride 0.9 % 500 mL continuous infusion  Status:  Discontinued     12 g 20.8 mL/hr over 24 Hours Intravenous Every 24 hours 06/09/19 1533 06/09/19 1608   06/10/19 0200  nafcillin 12 g in sodium chloride 0.9 % 500 mL continuous infusion     12 g 20.8 mL/hr over 24 Hours Intravenous Every 24 hours 06/09/19 1608     06/09/19 2124  ceFAZolin (ANCEF) IVPB 1 g/50 mL premix  Status:  Discontinued     1 g 100 mL/hr over 30 Minutes Intravenous Every 24 hours 06/09/19 0746 06/09/19 1301   06/09/19 1400  ceFAZolin (ANCEF) IVPB 2g/100 mL premix  Status:  Discontinued     2 g 200 mL/hr over 30 Minutes Intravenous Every 12 hours 06/09/19 1301 06/09/19 1533   06/05/19 2030  ceFAZolin (ANCEF) IVPB 2g/100 mL premix  Status:  Discontinued     2 g 200 mL/hr over 30 Minutes Intravenous Every 12 hours 06/05/19 0905 06/09/19 0746   06/03/19 2300  ceFEPIme (MAXIPIME) 2 g in sodium chloride 0.9 % 100 mL IVPB  Status:  Discontinued     2 g 200 mL/hr over 30 Minutes Intravenous Every 24 hours 06/03/19 0641 06/03/19 1831   06/03/19 2000  ceFEPIme (MAXIPIME) 2 g in sodium chloride 0.9 % 100 mL IVPB  Status:  Discontinued     2 g 200 mL/hr over 30 Minutes Intravenous Every 12 hours 06/03/19 1830 06/05/19 0905   06/02/19 1000  clindamycin (CLEOCIN) IVPB 600 mg  Status:  Discontinued     600 mg 100 mL/hr over 30 Minutes Intravenous Every 8 hours 06/02/19 0142 06/02/19 0812   06/02/19 0830  ceFEPIme (MAXIPIME) 2 g in sodium chloride 0.9 % 100 mL IVPB  Status:  Discontinued     2 g 200 mL/hr over 30 Minutes Intravenous Every 12 hours 06/02/19 0812 06/03/19 0641   06/02/19 0815  metroNIDAZOLE (FLAGYL) IVPB 500 mg  Status:  Discontinued     500 mg 100 mL/hr over 60 Minutes Intravenous Every 8 hours 06/02/19 0812 06/05/19 0905   06/02/19 0200  clindamycin  (CLEOCIN) IVPB 600 mg     600 mg 100 mL/hr over 30 Minutes Intravenous  Once 06/02/19 0142 06/02/19 0254   05/31/19 1330  nafcillin 12 g in sodium chloride 0.9 % 500 mL continuous infusion  Status:  Discontinued     12 g 20.8 mL/hr over 24 Hours Intravenous Every 24 hours 05/31/19 1052 06/02/19 0812   05/31/19 0200  nafcillin 2 g in sodium chloride 0.9 % 100 mL IVPB  Status:  Discontinued     2 g 200 mL/hr over 30 Minutes Intravenous Every 4 hours 05/31/19 0055 05/31/19 1052   05/31/19 0115  nafcillin injection 2 g  Status:  Discontinued     2 g Intravenous Every 4 hours 05/31/19 0027 05/31/19 0054      Best Practice/Protocols:  VTE Prophylaxis: Heparin (SQ) Continous Sedation  Consults: Treatment Team:  Jadene Pierinistergard, Thomas A, MD Yolonda Kidaogers, Jason Patrick, MD Estanislado EmmsFoster, Lori C, MD    Studies:    Events:  Subjective:    Overnight Issues:   Objective:  Vital signs for last 24 hours: Temp:  [97.1 F (36.2 C)-101.3 F (38.5 C)] 97.5 F (36.4 C) (05/07 0800) Pulse Rate:  [70-109] 98 (05/07 0830) Resp:  [18-36] 21 (05/07 0830) BP: (80-163)/(50-99) 123/60 (05/07 0830) SpO2:  [97 %-100 %] 100 % (05/07 0830) FiO2 (%):  [40 %] 40 % (05/07 0800) Weight:  [110 kg] 110 kg (05/07 0500)  Hemodynamic parameters for last 24 hours:    Intake/Output from previous day: 05/06 0701 - 05/07 0700 In: 4793.8 [I.V.:4397.9; IV Piggyback:395.9] Out: 3358 [Urine:10]  Intake/Output this shift: Total I/O In: 287.8 [I.V.:127.3; NG/GT:139.7; IV Piggyback:20.8] Out: 205 [Other:205]  Vent settings for last 24 hours: Vent Mode: PSV;CPAP FiO2 (%):  [40 %] 40 % Set Rate:  [20 bmp] 20 bmp Vt Set:  [409[670 mL] 670 mL PEEP:  [5 cmH20] 5 cmH20 Pressure Support:  [8 cmH20] 8 cmH20 Plateau Pressure:  [14 cmH20-20 cmH20] 15 cmH20  Physical Exam:  General: on vent wean Neuro: awake and looks to voice, some spont mvt, RN reports purposefull HEENT/Neck: ETT Resp: clear to auscultation bilaterally CVS:  RRR GI: soft, less dist, NT, a few BS Extremities: edema 1+  Results for orders placed or performed during the hospital encounter of 05/31/2019 (from the past 24 hour(s))  Glucose, capillary     Status: Abnormal   Collection Time: 06/09/19 11:53 AM  Result Value Ref Range   Glucose-Capillary 245 (H) 70 - 99 mg/dL  Glucose, capillary     Status: Abnormal   Collection Time: 06/09/19  4:11 PM  Result Value Ref Range   Glucose-Capillary 210 (H) 70 - 99 mg/dL  Renal function panel (daily at 1600)     Status: Abnormal   Collection Time: 06/09/19  5:03 PM  Result Value Ref Range   Sodium 136 135 - 145 mmol/L   Potassium 4.5 3.5 - 5.1 mmol/L   Chloride 99 98 - 111 mmol/L   CO2 21 (L) 22 - 32 mmol/L   Glucose, Bld 232 (H) 70 - 99 mg/dL   BUN 811112 (H) 8 - 23 mg/dL   Creatinine, Ser 9.143.59 (H) 0.61 - 1.24 mg/dL   Calcium 8.2 (L) 8.9 - 10.3 mg/dL   Phosphorus 4.7 (H) 2.5 - 4.6 mg/dL   Albumin 2.3 (L) 3.5 - 5.0  g/dL   GFR calc non Af Amer 17 (L) >60 mL/min   GFR calc Af Amer 20 (L) >60 mL/min   Anion gap 16 (H) 5 - 15  Glucose, capillary     Status: Abnormal   Collection Time: 06/09/19  7:25 PM  Result Value Ref Range   Glucose-Capillary 218 (H) 70 - 99 mg/dL  Glucose, capillary     Status: Abnormal   Collection Time: 06/09/19 11:27 PM  Result Value Ref Range   Glucose-Capillary 234 (H) 70 - 99 mg/dL  Glucose, capillary     Status: Abnormal   Collection Time: 06/10/19  3:32 AM  Result Value Ref Range   Glucose-Capillary 237 (H) 70 - 99 mg/dL  Renal function panel (daily at 0500)     Status: Abnormal   Collection Time: 06/10/19  5:31 AM  Result Value Ref Range   Sodium 136 135 - 145 mmol/L   Potassium 4.5 3.5 - 5.1 mmol/L   Chloride 97 (L) 98 - 111 mmol/L   CO2 21 (L) 22 - 32 mmol/L   Glucose, Bld 277 (H) 70 - 99 mg/dL   BUN 97 (H) 8 - 23 mg/dL   Creatinine, Ser 1.61 (H) 0.61 - 1.24 mg/dL   Calcium 8.3 (L) 8.9 - 10.3 mg/dL   Phosphorus 6.7 (H) 2.5 - 4.6 mg/dL   Albumin 2.3 (L) 3.5 -  5.0 g/dL   GFR calc non Af Amer 21 (L) >60 mL/min   GFR calc Af Amer 25 (L) >60 mL/min   Anion gap 18 (H) 5 - 15  Magnesium     Status: Abnormal   Collection Time: 06/10/19  5:31 AM  Result Value Ref Range   Magnesium 2.8 (H) 1.7 - 2.4 mg/dL  CBC     Status: Abnormal   Collection Time: 06/10/19  5:31 AM  Result Value Ref Range   WBC 22.8 (H) 4.0 - 10.5 K/uL   RBC 3.45 (L) 4.22 - 5.81 MIL/uL   Hemoglobin 10.4 (L) 13.0 - 17.0 g/dL   HCT 09.6 (L) 04.5 - 40.9 %   MCV 103.2 (H) 80.0 - 100.0 fL   MCH 30.1 26.0 - 34.0 pg   MCHC 29.2 (L) 30.0 - 36.0 g/dL   RDW 81.1 (H) 91.4 - 78.2 %   Platelets 305 150 - 400 K/uL   nRBC 0.9 (H) 0.0 - 0.2 %  Phosphorus     Status: Abnormal   Collection Time: 06/10/19  5:31 AM  Result Value Ref Range   Phosphorus 7.1 (H) 2.5 - 4.6 mg/dL  Glucose, capillary     Status: Abnormal   Collection Time: 06/10/19  8:15 AM  Result Value Ref Range   Glucose-Capillary 225 (H) 70 - 99 mg/dL    Assessment & Plan: Present on Admission: . Closed displaced fracture of body of left scapula . Traumatic brain injury with loss of consciousness (HCC) . OSA (obstructive sleep apnea) . RLS (restless legs syndrome) . Essential hypertension . Acute blood loss anemia . Multiple trauma . Tachycardia . Multiple closed fractures of ribs of left side . Abdominal distention . Closed left scapular fracture    LOS: 17 days   Additional comments:I reviewed the patient's new clinical lab test results. . [facility]  Acute hypoxic respiratory failurewith ARDS- much improved, weaning on 8/5 now SAH, concussion- NSGY c/s (Dr. Russella Dar head4/21, Keppra x7d for sz ppx,MR this AM showed TBI as expected. Sedation decreased and now more awake, purposeful Comminuted left scapula fx- slingfor now,  orthoc/s (Dr. Elzie Rings, [facility], f/u in 2weeks Left rib fx2-5,displaced with flail segment of 4 and 5,small left hemothorax,leftpulmonarycontusion T6-9 spinous  process fx Grade I spleen Incidental radiographic mild diverticulitis- monitor clinically Psoas hemorrhage Adrenal mass, 3x3cm - needs outpt workup Staph aureus bacteremia - Nafcillin per ID with duration total 4 weeks, WBC 22.7, afeb but on CRRT Aspiration PNA- completed ABC for PNA 5/5 Septic shock- off steroids, back on levo but down to 10, on vaso Gluteal and possible latissimus dorsi hematoma - possibly the cause of bacteremia. As above Malnutrition - TNA Hyperbilirubinemia- likely reabsorption of scattered hematomas. AKI - CRRTper Renal, appreciate their help and I D/W Dr. Arrie Aran yestreday, did poorly off CRRT early yesterday AM FEN -held TF for severe ileus,+BS, but no BM, on TPN, continue trickle TF  Hyperglycemia- persists, on TNA plus TF, increase lantus to 20u BID. I D/W pharmacy DVT - SCDs,SQH (AKI) Dispo - ICU. Hopefully extubation vs trach early next week. Critical Care Total Time*: 45 Minutes  Violeta Gelinas, MD, MPH, FACS Trauma & General Surgery Use [email or web address] to contact on call provider  06/10/2019  *Care during the described time interval was provided by me. I have reviewed this patient's available data, including medical history, events of note, physical examination and test results as part of my evaluation.

## 2019-06-10 NOTE — Progress Notes (Signed)
Patient ID: Ian Velasquez, male   DOB: November 06, 1955, 64 y.o.   MRN: [id number] I updated his wife on the unit.  Violeta Gelinas, MD, MPH, FACS Please use [email or web address] to contact on call provider

## 2019-06-10 NOTE — Progress Notes (Signed)
Patient's blood pressure low at 78/56. Patient's removal fluid even. Trauma MD  notified, MD told writer to call Nephrologist. Dr. Arrie Aran paged and he was ok for the pressor to be restarted.

## 2019-06-10 NOTE — TOC Progression Note (Signed)
Transition of Care [facility]) - Progression Note    Patient Details  Name: Ian Velasquez MRN: [id number] Date of Birth: Jun 20, 1955  [facility] CM/SW Contact  Glennon Mac, RN Phone Number: 06/10/2019, 2:18 PM  Clinical Narrative:   Returned completed and signed original FMLA forms to pt's wife, Inetta Fermo, after faxing to her employer.  Offered support to wife.  Will continue to follow progress.      Expected Discharge Plan: IP Rehab Facility Barriers to Discharge: Continued Medical Work up  Expected Discharge Plan and Services Expected Discharge Plan: IP Rehab Facility   Discharge Planning Services: CM Consult   Living arrangements for the past 2 months: Single Family Home                                       Social Determinants of Health (SDOH) Interventions    Readmission Risk Interventions No flowsheet data found.  Quintella Baton, RN, BSN  Trauma/Neuro ICU Case Manager [phone]
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
## 2019-06-11 LAB — COMPREHENSIVE METABOLIC PANEL
ALT: 55 U/L — ABNORMAL HIGH (ref 0–44)
AST: 164 U/L — ABNORMAL HIGH (ref 15–41)
Albumin: 1.9 g/dL — ABNORMAL LOW (ref 3.5–5.0)
Alkaline Phosphatase: 164 U/L — ABNORMAL HIGH (ref 38–126)
Anion gap: 20 — ABNORMAL HIGH (ref 5–15)
BUN: 84 mg/dL — ABNORMAL HIGH (ref 8–23)
CO2: 18 mmol/L — ABNORMAL LOW (ref 22–32)
Calcium: 8.1 mg/dL — ABNORMAL LOW (ref 8.9–10.3)
Chloride: 97 mmol/L — ABNORMAL LOW (ref 98–111)
Creatinine, Ser: 2.7 mg/dL — ABNORMAL HIGH (ref 0.61–1.24)
GFR calc Af Amer: 28 mL/min — ABNORMAL LOW (ref >60)
GFR calc non Af Amer: 24 mL/min — ABNORMAL LOW (ref >60)
Glucose, Bld: 246 mg/dL — ABNORMAL HIGH (ref 70–99)
Potassium: 4.9 mmol/L (ref 3.5–5.1)
Sodium: 135 mmol/L (ref 135–145)
Total Bilirubin: 21.9 mg/dL (ref 0.3–1.2)
Total Protein: 6.7 g/dL (ref 6.5–8.1)

## 2019-06-11 LAB — MAGNESIUM: Magnesium: 2.6 mg/dL — ABNORMAL HIGH (ref 1.7–2.4)

## 2019-06-11 LAB — POCT I-STAT 7, (LYTES, BLD GAS, ICA,H+H)
Acid-base deficit: 8 mmol/L — ABNORMAL HIGH (ref 0.0–2.0)
Bicarbonate: 18 mmol/L — ABNORMAL LOW (ref 20.0–28.0)
Calcium, Ion: 1.08 mmol/L — ABNORMAL LOW (ref 1.15–1.40)
HCT: 34 % — ABNORMAL LOW (ref 39.0–52.0)
Hemoglobin: 11.6 g/dL — ABNORMAL LOW (ref 13.0–17.0)
O2 Saturation: 94 %
Patient temperature: 98.19
Potassium: 4.9 mmol/L (ref 3.5–5.1)
Sodium: 134 mmol/L — ABNORMAL LOW (ref 135–145)
TCO2: 19 mmol/L — ABNORMAL LOW (ref 22–32)
pCO2 arterial: 37 mmHg (ref 32.0–48.0)
pH, Arterial: 7.295 — ABNORMAL LOW (ref 7.350–7.450)
pO2, Arterial: 78 mmHg — ABNORMAL LOW (ref 83.0–108.0)

## 2019-06-11 LAB — CBC
HCT: 32.7 % — ABNORMAL LOW (ref 39.0–52.0)
HCT: 33.7 % — ABNORMAL LOW (ref 39.0–52.0)
Hemoglobin: 9.9 g/dL — ABNORMAL LOW (ref 13.0–17.0)
Hemoglobin: 9.9 g/dL — ABNORMAL LOW (ref 13.0–17.0)
MCH: 29.9 pg (ref 26.0–34.0)
MCH: 29.6 pg (ref 26.0–34.0)
MCHC: 30.3 g/dL (ref 30.0–36.0)
MCHC: 29.4 g/dL — ABNORMAL LOW (ref 30.0–36.0)
MCV: 98.8 fL (ref 80.0–100.0)
MCV: 100.9 fL — ABNORMAL HIGH (ref 80.0–100.0)
Platelets: 386 10*3/uL (ref 150–400)
Platelets: 460 10*3/uL — ABNORMAL HIGH (ref 150–400)
RBC: 3.31 MIL/uL — ABNORMAL LOW (ref 4.22–5.81)
RBC: 3.34 MIL/uL — ABNORMAL LOW (ref 4.22–5.81)
RDW: 23.7 % — ABNORMAL HIGH (ref 11.5–15.5)
RDW: 24.4 % — ABNORMAL HIGH (ref 11.5–15.5)
WBC: 17.3 10*3/uL — ABNORMAL HIGH (ref 4.0–10.5)
WBC: 20.1 10*3/uL — ABNORMAL HIGH (ref 4.0–10.5)
nRBC: 1 % — ABNORMAL HIGH (ref 0.0–0.2)
nRBC: 2 % — ABNORMAL HIGH (ref 0.0–0.2)

## 2019-06-11 LAB — GLUCOSE, CAPILLARY
Glucose-Capillary: 125 mg/dL — ABNORMAL HIGH (ref 70–99)
Glucose-Capillary: 166 mg/dL — ABNORMAL HIGH (ref 70–99)
Glucose-Capillary: 179 mg/dL — ABNORMAL HIGH (ref 70–99)
Glucose-Capillary: 187 mg/dL — ABNORMAL HIGH (ref 70–99)
Glucose-Capillary: 214 mg/dL — ABNORMAL HIGH (ref 70–99)
Glucose-Capillary: 236 mg/dL — ABNORMAL HIGH (ref 70–99)

## 2019-06-11 LAB — RENAL FUNCTION PANEL
Albumin: 2 g/dL — ABNORMAL LOW (ref 3.5–5.0)
Albumin: 1.9 g/dL — ABNORMAL LOW (ref 3.5–5.0)
Anion gap: 18 — ABNORMAL HIGH (ref 5–15)
Anion gap: 15 (ref 5–15)
BUN: 84 mg/dL — ABNORMAL HIGH (ref 8–23)
BUN: 86 mg/dL — ABNORMAL HIGH (ref 8–23)
CO2: 21 mmol/L — ABNORMAL LOW (ref 22–32)
CO2: 22 mmol/L (ref 22–32)
Calcium: 8.2 mg/dL — ABNORMAL LOW (ref 8.9–10.3)
Calcium: 8.2 mg/dL — ABNORMAL LOW (ref 8.9–10.3)
Chloride: 97 mmol/L — ABNORMAL LOW (ref 98–111)
Chloride: 97 mmol/L — ABNORMAL LOW (ref 98–111)
Creatinine, Ser: 2.43 mg/dL — ABNORMAL HIGH (ref 0.61–1.24)
Creatinine, Ser: 2.56 mg/dL — ABNORMAL HIGH (ref 0.61–1.24)
GFR calc Af Amer: 31 mL/min — ABNORMAL LOW (ref >60)
GFR calc Af Amer: 29 mL/min — ABNORMAL LOW (ref >60)
GFR calc non Af Amer: 27 mL/min — ABNORMAL LOW (ref >60)
GFR calc non Af Amer: 25 mL/min — ABNORMAL LOW (ref >60)
Glucose, Bld: 258 mg/dL — ABNORMAL HIGH (ref 70–99)
Glucose, Bld: 264 mg/dL — ABNORMAL HIGH (ref 70–99)
Phosphorus: 6.3 mg/dL — ABNORMAL HIGH (ref 2.5–4.6)
Phosphorus: 7.6 mg/dL — ABNORMAL HIGH (ref 2.5–4.6)
Potassium: 4.4 mmol/L (ref 3.5–5.1)
Potassium: 4.5 mmol/L (ref 3.5–5.1)
Sodium: 136 mmol/L (ref 135–145)
Sodium: 134 mmol/L — ABNORMAL LOW (ref 135–145)

## 2019-06-11 LAB — TRIGLYCERIDES: Triglycerides: 840 mg/dL — ABNORMAL HIGH (ref <150)

## 2019-06-11 MED ORDER — PIVOT 1.5 CAL PO LIQD
1000.0000 mL | ORAL | Status: DC
  Administered 2019-06-11: 1000 mL

## 2019-06-11 MED ORDER — ZINC CHLORIDE 1 MG/ML IV SOLN
INTRAVENOUS | Status: AC
  Filled 2019-06-11: qty 1318.4

## 2019-06-11 MED ORDER — SODIUM CHLORIDE 0.9 % IV SOLN: INTRAVENOUS | Status: DC | PRN

## 2019-06-11 MED ORDER — QUETIAPINE FUMARATE 25 MG PO TABS
50.0000 mg | ORAL_TABLET | Freq: Two times a day (BID) | ORAL | Status: DC
  Administered 2019-06-11 – 2019-06-17 (×4): 50 mg
  Filled 2019-06-11 (×5): qty 2

## 2019-06-11 MED ORDER — INSULIN GLARGINE 100 UNIT/ML ~~LOC~~ SOLN
25.0000 [IU] | Freq: Two times a day (BID) | SUBCUTANEOUS | Status: DC
  Administered 2019-06-11 (×2): 25 [IU] via SUBCUTANEOUS
  Filled 2019-06-11 (×4): qty 0.25

## 2019-06-11 MED ORDER — ALBUMIN HUMAN 5 % IV SOLN
25.0000 g | Freq: Once | INTRAVENOUS | Status: AC
  Administered 2019-06-11: 25 g via INTRAVENOUS
  Filled 2019-06-11: qty 500

## 2019-06-11 MED ORDER — NOREPINEPHRINE 4 MG/250ML-% IV SOLN
0.0000 ug/min | INTRAVENOUS | Status: DC
  Administered 2019-06-11: 4 ug/min via INTRAVENOUS
  Administered 2019-06-11: 30 ug/min via INTRAVENOUS
  Administered 2019-06-11: 14 ug/min via INTRAVENOUS
  Administered 2019-06-12: 35 ug/min via INTRAVENOUS
  Administered 2019-06-12: 36 ug/min via INTRAVENOUS
  Administered 2019-06-12: 40 ug/min via INTRAVENOUS
  Administered 2019-06-12: 36 ug/min via INTRAVENOUS
  Administered 2019-06-12: 35 ug/min via INTRAVENOUS
  Filled 2019-06-11 (×8): qty 250
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
## 2019-06-11 NOTE — Progress Notes (Signed)
Pt has vomited 2x both times orange/brown in color, TFs held. Trauma notified
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
## 2019-06-11 NOTE — Progress Notes (Signed)
ALine insertion attempted X 4 by 2 different RTs. Unsuccessful.

## 2019-06-11 NOTE — Progress Notes (Addendum)
Patient ID: Octavis Sheeler, male   DOB: 06-21-1955, 64 y.o.   MRN: [id number] Follow up - Trauma Critical Care  Patient Details:    Demitris Pokorny is an 64 y.o. male.  Lines/tubes : Airway 8 mm (Active)  Secured at (cm) 26 cm 06/11/19 0741  Measured From Lips 06/11/19 0741  Secured Location Right 06/11/19 0741  Secured By [employer] 06/11/19 0741  Tube Holder Repositioned Yes 06/11/19 0741  Cuff Pressure (cm H2O) 30 cm H2O 06/11/19 0741  Site Condition Dry 06/11/19 0741     CVC Triple Lumen 06/02/19 Left Subclavian (Active)  Indication for Insertion or Continuance of Line Administration of hyperosmolar/irritating solutions (i.e. TPN, Vancomycin, etc.) 06/10/19 2000  Site Assessment Dry;Clean;Intact 06/10/19 2000  Proximal Lumen Status Infusing 06/09/19 0800  Medial Lumen Status Infusing 06/09/19 0800  Distal Lumen Status Infusing 06/09/19 0800  Dressing Type Transparent;Occlusive 06/10/19 2000  Dressing Status Clean;Intact;Dry;Antimicrobial disc in place 06/10/19 2000  Line Care Connections checked and tightened 06/10/19 2000  Dressing Intervention New dressing 06/09/19 0610  Dressing Change Due 06/16/19 06/10/19 2000     NG/OG Tube Orogastric Left mouth (Active)  Site Assessment Clean;Dry;Intact 06/10/19 2000  Ongoing Placement Verification No change in cm markings or external length of tube from initial placement;No change in respiratory status;No acute changes, not attributed to clinical condition 06/09/19 2000  Status Infusing tube feed 06/10/19 2000  Amount of suction 80 mmHg 06/07/19 0800  Drainage Appearance Green;Brown 06/07/19 0800  Intake (mL) 60 mL 06/29/2019 1541  Output (mL) 350 mL 06/07/19 1800    Microbiology/Sepsis markers: Results for orders placed or performed during the hospital encounter of May 25, 2019  Respiratory Panel by RT PCR (Flu A&B, Covid) - Nasopharyngeal Swab     Status: None   Collection Time: May 25, 2019  5:23 PM   Specimen: Nasopharyngeal Swab   Result Value Ref Range Status   SARS Coronavirus 2 by RT PCR NEGATIVE NEGATIVE Final    Comment: (NOTE) SARS-CoV-2 target nucleic acids are NOT DETECTED. The SARS-CoV-2 RNA is generally detectable in upper respiratoy specimens during the acute phase of infection. The lowest concentration of SARS-CoV-2 viral copies this assay can detect is 131 copies/mL. A negative result does not preclude SARS-Cov-2 infection and should not be used as the sole basis for treatment or other patient management decisions. A negative result may occur with  improper specimen collection/handling, submission of specimen other than nasopharyngeal swab, presence of viral mutation(s) within the areas targeted by this assay, and inadequate number of viral copies (<131 copies/mL). A negative result must be combined with clinical observations, patient history, and epidemiological information. The expected result is Negative. Fact Sheet for Patients:  https://www.moore.com/ Fact Sheet for Healthcare Providers:  https://www.young.biz/ This test is not yet ap proved or cleared by the [place] FDA and  has been authorized for detection and/or diagnosis of SARS-CoV-2 by FDA under an Emergency Use Authorization (EUA). This EUA will remain  in effect (meaning this test can be used) for the duration of the COVID-19 declaration under Section 564(b)(1) of the Act, 21 U.S.C. section 360bbb-3(b)(1), unless the authorization is terminated or revoked sooner.    Influenza A by PCR NEGATIVE NEGATIVE Final   Influenza B by PCR NEGATIVE NEGATIVE Final    Comment: (NOTE) The Xpert Xpress SARS-CoV-2/FLU/RSV assay is intended as an aid in  the diagnosis of influenza from Nasopharyngeal swab specimens and  should not be used as a sole basis for treatment. Nasal washings and  aspirates are  unacceptable for Xpert Xpress SARS-CoV-2/FLU/RSV  testing. Fact Sheet for  Patients: https://www.moore.com/ Fact Sheet for Healthcare Providers: https://www.young.biz/ This test is not yet approved or cleared by the [place] FDA and  has been authorized for detection and/or diagnosis of SARS-CoV-2 by  FDA under an Emergency Use Authorization (EUA). This EUA will remain  in effect (meaning this test can be used) for the duration of the  Covid-19 declaration under Section 564(b)(1) of the Act, 21  U.S.C. section 360bbb-3(b)(1), unless the authorization is  terminated or revoked. Performed at [facility], [address], [place], [place] [address]   MRSA PCR Screening     Status: None   Collection Time: Jun 23, 2019  8:16 PM   Specimen: Nasopharyngeal  Result Value Ref Range Status   MRSA by PCR NEGATIVE NEGATIVE Final    Comment:        The GeneXpert MRSA Assay (FDA approved for NASAL specimens only), is one component of a comprehensive MRSA colonization surveillance program. It is not intended to diagnose MRSA infection nor to guide or monitor treatment for MRSA infections. Performed at [facility], [address] [place], [place] [address]   Culture, Urine     Status: Abnormal   Collection Time: 05/30/19  8:44 AM   Specimen: Urine, Clean Catch  Result Value Ref Range Status   Specimen Description URINE, CLEAN CATCH  Final   Special Requests NONE  Final   Culture (A)  Final    <10,000 COLONIES/mL INSIGNIFICANT GROWTH Performed at [facility], [address] [place], [place] [address]    Report Status 05/31/2019 FINAL  Final  Culture, blood (routine x 2)     Status: Abnormal   Collection Time: 05/30/19  9:50 AM   Specimen: BLOOD  Result Value Ref Range Status   Specimen Description BLOOD LEFT ANTECUBITAL  Final   Special Requests   Final    BOTTLES DRAWN AEROBIC ONLY Blood Culture results may not be optimal due to an inadequate volume of blood received in culture bottles   Culture   Setup Time   Final    GRAM POSITIVE COCCI IN CLUSTERS AEROBIC BOTTLE ONLY CRITICAL RESULT CALLED TO, READ BACK BY AND VERIFIED WITH: Melven [facility]ius [facility] 05/31/19 0013 JDW Performed at [facility], [address], [place], [place] [address]    Culture STAPHYLOCOCCUS AUREUS (A)  Final   Report Status 06/01/2019 FINAL  Final   Organism ID, Bacteria STAPHYLOCOCCUS AUREUS  Final      Susceptibility   Staphylococcus aureus - MIC*    CIPROFLOXACIN <=0.5 SENSITIVE Sensitive     ERYTHROMYCIN <=0.25 SENSITIVE Sensitive     GENTAMICIN <=0.5 SENSITIVE Sensitive     OXACILLIN 0.5 SENSITIVE Sensitive     TETRACYCLINE <=1 SENSITIVE Sensitive     VANCOMYCIN <=0.5 SENSITIVE Sensitive     TRIMETH/SULFA <=10 SENSITIVE Sensitive     CLINDAMYCIN <=0.25 SENSITIVE Sensitive     RIFAMPIN <=0.5 SENSITIVE Sensitive     Inducible Clindamycin NEGATIVE Sensitive     * STAPHYLOCOCCUS AUREUS  Blood Culture ID Panel (Reflexed)     Status: Abnormal   Collection Time: 05/30/19  9:50 AM  Result Value Ref Range Status   Enterococcus species NOT DETECTED NOT DETECTED Final   Listeria monocytogenes NOT DETECTED NOT DETECTED Final   Staphylococcus species DETECTED (A) NOT DETECTED Final    Comment: CRITICAL RESULT CALLED TO, READ BACK BY AND VERIFIED WITH: J [facility] 05/31/19  0013 JDW    Staphylococcus aureus (BCID) DETECTED (A) NOT DETECTED Final    Comment: Methicillin (oxacillin) susceptible Staphylococcus aureus (MSSA). Preferred therapy is anti staphylococcal beta lactam antibiotic (Cefazolin or Nafcillin), unless clinically contraindicated. CRITICAL RESULT CALLED TO, READ BACK BY AND VERIFIED WITH: J [facility] 05/31/19 0013 JDW    Methicillin resistance NOT DETECTED NOT DETECTED Final   Streptococcus species NOT DETECTED NOT DETECTED Final   Streptococcus agalactiae NOT DETECTED NOT DETECTED Final   Streptococcus pneumoniae NOT DETECTED NOT DETECTED Final   Streptococcus pyogenes NOT DETECTED NOT  DETECTED Final   Acinetobacter baumannii NOT DETECTED NOT DETECTED Final   Enterobacteriaceae species NOT DETECTED NOT DETECTED Final   Enterobacter cloacae complex NOT DETECTED NOT DETECTED Final   Escherichia coli NOT DETECTED NOT DETECTED Final   Klebsiella oxytoca NOT DETECTED NOT DETECTED Final   Klebsiella pneumoniae NOT DETECTED NOT DETECTED Final   Proteus species NOT DETECTED NOT DETECTED Final   Serratia marcescens NOT DETECTED NOT DETECTED Final   Haemophilus influenzae NOT DETECTED NOT DETECTED Final   Neisseria meningitidis NOT DETECTED NOT DETECTED Final   Pseudomonas aeruginosa NOT DETECTED NOT DETECTED Final   Candida albicans NOT DETECTED NOT DETECTED Final   Candida glabrata NOT DETECTED NOT DETECTED Final   Candida krusei NOT DETECTED NOT DETECTED Final   Candida parapsilosis NOT DETECTED NOT DETECTED Final   Candida tropicalis NOT DETECTED NOT DETECTED Final    Comment: Performed at [facility], [place] [address] [place], [place] [address]  Culture, blood (routine x 2)     Status: Abnormal   Collection Time: 05/30/19  9:55 AM   Specimen: BLOOD  Result Value Ref Range Status   Specimen Description BLOOD LEFT ANTECUBITAL  Final   Special Requests   Final    BOTTLES DRAWN AEROBIC ONLY Blood Culture results may not be optimal due to an inadequate volume of blood received in culture bottles   Culture  Setup Time   Final    AEROBIC BOTTLE ONLY GRAM POSITIVE COCCI IN CLUSTERS CRITICAL VALUE NOTED.  VALUE IS CONSISTENT WITH PREVIOUSLY REPORTED AND CALLED VALUE.    Culture (A)  Final    STAPHYLOCOCCUS AUREUS SUSCEPTIBILITIES PERFORMED ON PREVIOUS CULTURE WITHIN THE LAST 5 DAYS. Performed at [facility], [place] [address] [place], [place] [address]    Report Status 06/01/2019 FINAL  Final  Culture, blood (routine x 2)     Status: None   Collection Time: 05/31/19  7:12 AM   Specimen: BLOOD RIGHT HAND  Result Value Ref Range Status   Specimen Description BLOOD  RIGHT HAND  Final   Special Requests   Final    BOTTLES DRAWN AEROBIC ONLY Blood Culture results may not be optimal due to an inadequate volume of blood received in culture bottles   Culture   Final    NO GROWTH 5 DAYS Performed at [facility], [place] [address], [place],  [address]    Report Status 06/05/2019 FINAL  Final  Culture, blood (routine x 2)     Status: None   Collection Time: 05/31/19  7:45 AM   Specimen: BLOOD LEFT ARM  Result Value Ref Range Status   Specimen Description BLOOD LEFT ARM  Final   Special Requests   Final    BOTTLES DRAWN AEROBIC ONLY Blood Culture results may not be optimal due to an inadequate volume of blood received in culture bottles   Culture   Final  NO GROWTH 5 DAYS Performed at [facility], [place] [address] [place], [place] [address]    Report Status 06/05/2019 FINAL  Final  Culture, respiratory (non-expectorated)     Status: None   Collection Time: 06/02/19  9:17 AM   Specimen: Tracheal Aspirate; Respiratory  Result Value Ref Range Status   Specimen Description TRACHEAL ASPIRATE  Final   Special Requests Normal  Final   Gram Stain   Final    NO WBC SEEN ABUNDANT GRAM NEGATIVE RODS RARE YEAST Performed at [facility], [address], [place], [place] [address]    Culture ABUNDANT ESCHERICHIA COLI  Final   Report Status 06/04/2019 FINAL  Final   Organism ID, Bacteria ESCHERICHIA COLI  Final      Susceptibility   Escherichia coli - MIC*    AMPICILLIN >=32 RESISTANT Resistant     CEFAZOLIN 8 SENSITIVE Sensitive     CEFEPIME <=1 SENSITIVE Sensitive     CEFTAZIDIME <=1 SENSITIVE Sensitive     CEFTRIAXONE <=1 SENSITIVE Sensitive     CIPROFLOXACIN 1 SENSITIVE Sensitive     GENTAMICIN <=1 SENSITIVE Sensitive     IMIPENEM <=0.25 SENSITIVE Sensitive     TRIMETH/SULFA <=20 SENSITIVE Sensitive     AMPICILLIN/SULBACTAM >=32 RESISTANT Resistant     PIP/TAZO 64 INTERMEDIATE Intermediate     * ABUNDANT ESCHERICHIA COLI     Anti-infectives:  Anti-infectives (From admission, onward)   Start     Dose/Rate Route Frequency Ordered Stop   06/10/19 1400  nafcillin 12 g in sodium chloride 0.9 % 500 mL continuous infusion  Status:  Discontinued     12 g 20.8 mL/hr over 24 Hours Intravenous Every 24 hours 06/09/19 1533 06/09/19 1608   06/10/19 0200  nafcillin 12 g in sodium chloride 0.9 % 500 mL continuous infusion     12 g 20.8 mL/hr over 24 Hours Intravenous Every 24 hours 06/09/19 1608 06/21/19 2359   06/09/19 2124  ceFAZolin (ANCEF) IVPB 1 g/50 mL premix  Status:  Discontinued     1 g 100 mL/hr over 30 Minutes Intravenous Every 24 hours 06/09/19 0746 06/09/19 1301   06/09/19 1400  ceFAZolin (ANCEF) IVPB 2g/100 mL premix  Status:  Discontinued     2 g 200 mL/hr over 30 Minutes Intravenous Every 12 hours 06/09/19 1301 06/09/19 1533   06/05/19 2030  ceFAZolin (ANCEF) IVPB 2g/100 mL premix  Status:  Discontinued     2 g 200 mL/hr over 30 Minutes Intravenous Every 12 hours 06/05/19 0905 06/09/19 0746   06/03/19 2300  ceFEPIme (MAXIPIME) 2 g in sodium chloride 0.9 % 100 mL IVPB  Status:  Discontinued     2 g 200 mL/hr over 30 Minutes Intravenous Every 24 hours 06/03/19 0641 06/03/19 1831   06/03/19 2000  ceFEPIme (MAXIPIME) 2 g in sodium chloride 0.9 % 100 mL IVPB  Status:  Discontinued     2 g 200 mL/hr over 30 Minutes Intravenous Every 12 hours 06/03/19 1830 06/05/19 0905   06/02/19 1000  clindamycin (CLEOCIN) IVPB 600 mg  Status:  Discontinued     600 mg 100 mL/hr over 30 Minutes Intravenous Every 8 hours 06/02/19 0142 06/02/19 0812   06/02/19 0830  ceFEPIme (MAXIPIME) 2 g in sodium chloride 0.9 % 100 mL IVPB  Status:  Discontinued     2 g 200 mL/hr over 30 Minutes Intravenous Every 12 hours 06/02/19 0812 06/03/19 0641   06/02/19 0815  metroNIDAZOLE (FLAGYL) IVPB 500 mg  Status:  Discontinued     500 mg 100 mL/hr over 60 Minutes Intravenous Every 8 hours 06/02/19 0812 06/05/19 0905   06/02/19 0200   clindamycin (CLEOCIN) IVPB 600 mg     600 mg 100 mL/hr over 30 Minutes Intravenous  Once 06/02/19 0142 06/02/19 0254   05/31/19 1330  nafcillin 12 g in sodium chloride 0.9 % 500 mL continuous infusion  Status:  Discontinued     12 g 20.8 mL/hr over 24 Hours Intravenous Every 24 hours 05/31/19 1052 06/02/19 0812   05/31/19 0200  nafcillin 2 g in sodium chloride 0.9 % 100 mL IVPB  Status:  Discontinued     2 g 200 mL/hr over 30 Minutes Intravenous Every 4 hours 05/31/19 0055 05/31/19 1052   05/31/19 0115  nafcillin injection 2 g  Status:  Discontinued     2 g Intravenous Every 4 hours 05/31/19 0027 05/31/19 0054      Best Practice/Protocols:  VTE Prophylaxis: Heparin (SQ) Continous Sedation  Consults: Treatment Team:  Yolonda Kida, MD Estanislado Emms, MD    Studies:    Events:  Subjective:    Overnight Issues:   Objective:  Vital signs for last 24 hours: Temp:  [98.1 F (36.7 C)-98.4 F (36.9 C)] 98.4 F (36.9 C) (05/08 0400) Pulse Rate:  [94-110] 100 (05/08 0741) Resp:  [18-34] 34 (05/08 0741) BP: (76-162)/(39-105) 90/61 (05/08 0741) SpO2:  [96 %-100 %] 100 % (05/08 0741) FiO2 (%):  [40 %] 40 % (05/08 0741) Weight:  [105.5 kg] 105.5 kg (05/08 0207)  Hemodynamic parameters for last 24 hours:    Intake/Output from previous day: 05/07 0701 - 05/08 0700 In: 3421.9 [I.V.:2798.5; NG/GT:229.7; IV Piggyback:393.8] Out: 3957   Intake/Output this shift: Total I/O In: 322 [I.V.:322] Out: 190 [Other:190]  Vent settings for last 24 hours: Vent Mode: PRVC FiO2 (%):  [40 %] 40 % Set Rate:  [20 bmp] 20 bmp Vt Set:  [448 mL] 670 mL PEEP:  [5 cmH20] 5 cmH20 Plateau Pressure:  [15 cmH20-21 cmH20] 19 cmH20  Physical Exam:  General: no respiratory distress Neuro: arouses, looks to voice, purposeful HEENT/Neck: ETT Resp: clear to auscultation bilaterally CVS: RRR GI: soft, less distended, BS Extremities: edema 1+  Results for orders placed or performed  during the hospital encounter of 05/05/2019 (from the past 24 hour(s))  Glucose, capillary     Status: Abnormal   Collection Time: 06/10/19 11:28 AM  Result Value Ref Range   Glucose-Capillary 215 (H) 70 - 99 mg/dL  Glucose, capillary     Status: Abnormal   Collection Time: 06/10/19  3:44 PM  Result Value Ref Range   Glucose-Capillary 215 (H) 70 - 99 mg/dL  Renal function panel (daily at 1600)     Status: Abnormal   Collection Time: 06/10/19  4:40 PM  Result Value Ref Range   Sodium 136 135 - 145 mmol/L   Potassium 4.5 3.5 - 5.1 mmol/L   Chloride 97 (L) 98 - 111 mmol/L   CO2 22 22 - 32 mmol/L   Glucose, Bld 248 (H) 70 - 99 mg/dL   BUN 95 (H) 8 - 23 mg/dL   Creatinine, Ser 1.85 (H) 0.61 - 1.24 mg/dL   Calcium 8.2 (L) 8.9 - 10.3 mg/dL   Phosphorus 9.3 (H) 2.5 - 4.6 mg/dL   Albumin 2.1 (L) 3.5 - 5.0 g/dL   GFR calc non Af Amer 24 (L) >60 mL/min   GFR calc Af Amer 27 (L) >60 mL/min  Anion gap 17 (H) 5 - 15  Glucose, capillary     Status: Abnormal   Collection Time: 06/10/19  7:26 PM  Result Value Ref Range   Glucose-Capillary 209 (H) 70 - 99 mg/dL  Glucose, capillary     Status: Abnormal   Collection Time: 06/10/19 11:36 PM  Result Value Ref Range   Glucose-Capillary 195 (H) 70 - 99 mg/dL  Glucose, capillary     Status: Abnormal   Collection Time: 06/11/19  3:24 AM  Result Value Ref Range   Glucose-Capillary 179 (H) 70 - 99 mg/dL  Magnesium     Status: Abnormal   Collection Time: 06/11/19  5:54 AM  Result Value Ref Range   Magnesium 2.6 (H) 1.7 - 2.4 mg/dL  CBC     Status: Abnormal   Collection Time: 06/11/19  5:54 AM  Result Value Ref Range   WBC 17.3 (H) 4.0 - 10.5 K/uL   RBC 3.31 (L) 4.22 - 5.81 MIL/uL   Hemoglobin 9.9 (L) 13.0 - 17.0 g/dL   HCT 32.7 (L) 39.0 - 52.0 %   MCV 98.8 80.0 - 100.0 fL   MCH 29.9 26.0 - 34.0 pg   MCHC 30.3 30.0 - 36.0 g/dL   RDW 23.7 (H) 11.5 - 15.5 %   Platelets 386 150 - 400 K/uL   nRBC 1.0 (H) 0.0 - 0.2 %  Renal function panel (daily at  0500)     Status: Abnormal   Collection Time: 06/11/19  5:55 AM  Result Value Ref Range   Sodium 136 135 - 145 mmol/L   Potassium 4.4 3.5 - 5.1 mmol/L   Chloride 97 (L) 98 - 111 mmol/L   CO2 21 (L) 22 - 32 mmol/L   Glucose, Bld 258 (H) 70 - 99 mg/dL   BUN 84 (H) 8 - 23 mg/dL   Creatinine, Ser 2.43 (H) 0.61 - 1.24 mg/dL   Calcium 8.2 (L) 8.9 - 10.3 mg/dL   Phosphorus 6.3 (H) 2.5 - 4.6 mg/dL   Albumin 2.0 (L) 3.5 - 5.0 g/dL   GFR calc non Af Amer 27 (L) >60 mL/min   GFR calc Af Amer 31 (L) >60 mL/min   Anion gap 18 (H) 5 - 15  Triglycerides     Status: Abnormal   Collection Time: 06/11/19  5:55 AM  Result Value Ref Range   Triglycerides 840 (H) <150 mg/dL  Glucose, capillary     Status: Abnormal   Collection Time: 06/11/19  7:32 AM  Result Value Ref Range   Glucose-Capillary 187 (H) 70 - 99 mg/dL   Comment 1 Notify RN    Comment 2 Document in Chart     Assessment & Plan: Present on Admission: . Closed displaced fracture of body of left scapula . Traumatic brain injury with loss of consciousness ([place]) . OSA (obstructive sleep apnea) . RLS (restless legs syndrome) . Essential hypertension . Acute blood loss anemia . Multiple trauma . Tachycardia . Multiple closed fractures of ribs of left side . Abdominal distention . Closed left scapular fracture    LOS: 18 days   Additional comments:I reviewed the patient's new clinical lab test results. . [facility]  Acute hypoxic respiratory failurewith ARDS- much improved, weaning but got agitated, add seroquel SAH, concussion- NSGY c/s (Dr. Grier Mitts head4/21, [place] x7d for sz ppx,MR this AM showed TBI as expected. Sedation decreased and now more awake, purposeful Comminuted left scapula fx- slingfor now, orthoc/s (Dr. Tammy Sours, [facility], f/u in 2weeks Left rib fx2-5,displaced with  flail segment of 4 and 5,small left hemothorax,leftpulmonarycontusion T6-9 spinous process fx Grade I spleen Incidental  radiographic mild diverticulitis- monitor clinically Psoas hemorrhage Adrenal mass, 3x3cm - needs outpt workup Staph aureus bacteremia - Nafcillin per ID through 06/21/19 per ID Aspiration PNA- completed ABC for PNA 5/5 Septic shock- off steroids, neo and vaso, switch to levo and wean neo Gluteal and possible latissimus dorsi hematoma - possibly the cause of bacteremia. As above Malnutrition - TNA Hyperbilirubinemia- likely reabsorption of scattered hematomas. AKI - CRRT per Renal, keep even, I D/W Dr. Arrie Aran at the bedside. FEN -large BM this AM, TNA to 60/h, increase TF to 20/h Hyperglycemia- persists, on TNA plus TF, increase lantus to 25u BID VTE - SCDs,SQH (AKI) Dispo - ICU. Hopefully extubation vs trach early next week. Critical Care Total Time*: 45 Minutes  Violeta Gelinas, MD, MPH, FACS Trauma & General Surgery Use [email or web address] to contact on call provider  06/11/2019  *Care during the described time interval was provided by me. I have reviewed this patient's available data, including medical history, events of note, physical examination and test results as part of my evaluation.

## 2019-06-12 ENCOUNTER — Inpatient Hospital Stay (HOSPITAL_COMMUNITY): Payer: PRIVATE HEALTH INSURANCE

## 2019-06-12 DIAGNOSIS — T07XXXA Unspecified multiple injuries, initial encounter: Secondary | ICD-10-CM | POA: Diagnosis not present

## 2019-06-12 LAB — POCT I-STAT 7, (LYTES, BLD GAS, ICA,H+H)
Acid-base deficit: 12 mmol/L — ABNORMAL HIGH (ref 0.0–2.0)
Acid-base deficit: 11 mmol/L — ABNORMAL HIGH (ref 0.0–2.0)
Acid-base deficit: 2 mmol/L (ref 0.0–2.0)
Acid-base deficit: 3 mmol/L — ABNORMAL HIGH (ref 0.0–2.0)
Bicarbonate: 15.8 mmol/L — ABNORMAL LOW (ref 20.0–28.0)
Bicarbonate: 17.5 mmol/L — ABNORMAL LOW (ref 20.0–28.0)
Bicarbonate: 24 mmol/L (ref 20.0–28.0)
Bicarbonate: 23.4 mmol/L (ref 20.0–28.0)
Calcium, Ion: 1.04 mmol/L — ABNORMAL LOW (ref 1.15–1.40)
Calcium, Ion: 1.04 mmol/L — ABNORMAL LOW (ref 1.15–1.40)
Calcium, Ion: 0.95 mmol/L — ABNORMAL LOW (ref 1.15–1.40)
Calcium, Ion: 0.96 mmol/L — ABNORMAL LOW (ref 1.15–1.40)
HCT: 26 % — ABNORMAL LOW (ref 39.0–52.0)
HCT: 29 % — ABNORMAL LOW (ref 39.0–52.0)
HCT: 26 % — ABNORMAL LOW (ref 39.0–52.0)
HCT: 28 % — ABNORMAL LOW (ref 39.0–52.0)
Hemoglobin: 8.8 g/dL — ABNORMAL LOW (ref 13.0–17.0)
Hemoglobin: 9.9 g/dL — ABNORMAL LOW (ref 13.0–17.0)
Hemoglobin: 8.8 g/dL — ABNORMAL LOW (ref 13.0–17.0)
Hemoglobin: 9.5 g/dL — ABNORMAL LOW (ref 13.0–17.0)
O2 Saturation: 65 %
O2 Saturation: 53 %
O2 Saturation: 100 %
O2 Saturation: 100 %
Patient temperature: 98.3
Patient temperature: 98.7
Potassium: 6.6 mmol/L (ref 3.5–5.1)
Potassium: 6.1 mmol/L — ABNORMAL HIGH (ref 3.5–5.1)
Potassium: 5.3 mmol/L — ABNORMAL HIGH (ref 3.5–5.1)
Potassium: 4.8 mmol/L (ref 3.5–5.1)
Sodium: 132 mmol/L — ABNORMAL LOW (ref 135–145)
Sodium: 134 mmol/L — ABNORMAL LOW (ref 135–145)
Sodium: 139 mmol/L (ref 135–145)
Sodium: 137 mmol/L (ref 135–145)
TCO2: 17 mmol/L — ABNORMAL LOW (ref 22–32)
TCO2: 19 mmol/L — ABNORMAL LOW (ref 22–32)
TCO2: 25 mmol/L (ref 22–32)
TCO2: 25 mmol/L (ref 22–32)
pCO2 arterial: 44.6 mmHg (ref 32.0–48.0)
pCO2 arterial: 53.2 mmHg — ABNORMAL HIGH (ref 32.0–48.0)
pCO2 arterial: 47.5 mmHg (ref 32.0–48.0)
pCO2 arterial: 50.4 mmHg — ABNORMAL HIGH (ref 32.0–48.0)
pH, Arterial: 7.158 — CL (ref 7.350–7.450)
pH, Arterial: 7.124 — CL (ref 7.350–7.450)
pH, Arterial: 7.311 — ABNORMAL LOW (ref 7.350–7.450)
pH, Arterial: 7.275 — ABNORMAL LOW (ref 7.350–7.450)
pO2, Arterial: 43 mmHg — ABNORMAL LOW (ref 83.0–108.0)
pO2, Arterial: 37 mmHg — CL (ref 83.0–108.0)
pO2, Arterial: 423 mmHg — ABNORMAL HIGH (ref 83.0–108.0)
pO2, Arterial: 539 mmHg — ABNORMAL HIGH (ref 83.0–108.0)

## 2019-06-12 LAB — GLUCOSE, CAPILLARY
Glucose-Capillary: 81 mg/dL (ref 70–99)
Glucose-Capillary: 78 mg/dL (ref 70–99)
Glucose-Capillary: 69 mg/dL — ABNORMAL LOW (ref 70–99)
Glucose-Capillary: 86 mg/dL (ref 70–99)
Glucose-Capillary: 165 mg/dL — ABNORMAL HIGH (ref 70–99)
Glucose-Capillary: 135 mg/dL — ABNORMAL HIGH (ref 70–99)

## 2019-06-12 LAB — URINALYSIS, ROUTINE W REFLEX MICROSCOPIC
Bilirubin Urine: NEGATIVE
Glucose, UA: 50 mg/dL — AB
Ketones, ur: NEGATIVE mg/dL
Leukocytes,Ua: NEGATIVE
Nitrite: NEGATIVE
Protein, ur: 100 mg/dL — AB
RBC / HPF: >50 RBC/hpf — ABNORMAL HIGH (ref 0–5)
Specific Gravity, Urine: 1.029 (ref 1.005–1.030)
WBC, UA: >50 WBC/hpf — ABNORMAL HIGH (ref 0–5)
pH: 5 (ref 5.0–8.0)

## 2019-06-12 LAB — RENAL FUNCTION PANEL
Albumin: 2.1 g/dL — ABNORMAL LOW (ref 3.5–5.0)
Albumin: 2.3 g/dL — ABNORMAL LOW (ref 3.5–5.0)
Anion gap: 18 — ABNORMAL HIGH (ref 5–15)
Anion gap: 18 — ABNORMAL HIGH (ref 5–15)
BUN: 74 mg/dL — ABNORMAL HIGH (ref 8–23)
BUN: 76 mg/dL — ABNORMAL HIGH (ref 8–23)
CO2: 19 mmol/L — ABNORMAL LOW (ref 22–32)
CO2: 21 mmol/L — ABNORMAL LOW (ref 22–32)
Calcium: 8.1 mg/dL — ABNORMAL LOW (ref 8.9–10.3)
Calcium: 7.2 mg/dL — ABNORMAL LOW (ref 8.9–10.3)
Chloride: 101 mmol/L (ref 98–111)
Chloride: 102 mmol/L (ref 98–111)
Creatinine, Ser: 2.43 mg/dL — ABNORMAL HIGH (ref 0.61–1.24)
Creatinine, Ser: 2.72 mg/dL — ABNORMAL HIGH (ref 0.61–1.24)
GFR calc Af Amer: 31 mL/min — ABNORMAL LOW (ref >60)
GFR calc Af Amer: 27 mL/min — ABNORMAL LOW (ref >60)
GFR calc non Af Amer: 27 mL/min — ABNORMAL LOW (ref >60)
GFR calc non Af Amer: 24 mL/min — ABNORMAL LOW (ref >60)
Glucose, Bld: 94 mg/dL (ref 70–99)
Glucose, Bld: 128 mg/dL — ABNORMAL HIGH (ref 70–99)
Phosphorus: 7.9 mg/dL — ABNORMAL HIGH (ref 2.5–4.6)
Phosphorus: 10.3 mg/dL — ABNORMAL HIGH (ref 2.5–4.6)
Potassium: 5.3 mmol/L — ABNORMAL HIGH (ref 3.5–5.1)
Potassium: 6.3 mmol/L (ref 3.5–5.1)
Sodium: 138 mmol/L (ref 135–145)
Sodium: 141 mmol/L (ref 135–145)

## 2019-06-12 LAB — CBC
HCT: 33.2 % — ABNORMAL LOW (ref 39.0–52.0)
Hemoglobin: 9.8 g/dL — ABNORMAL LOW (ref 13.0–17.0)
MCH: 30.3 pg (ref 26.0–34.0)
MCHC: 29.5 g/dL — ABNORMAL LOW (ref 30.0–36.0)
MCV: 102.8 fL — ABNORMAL HIGH (ref 80.0–100.0)
Platelets: 424 10*3/uL — ABNORMAL HIGH (ref 150–400)
RBC: 3.23 MIL/uL — ABNORMAL LOW (ref 4.22–5.81)
RDW: 24.7 % — ABNORMAL HIGH (ref 11.5–15.5)
WBC: 25.7 10*3/uL — ABNORMAL HIGH (ref 4.0–10.5)
nRBC: 4.4 % — ABNORMAL HIGH (ref 0.0–0.2)

## 2019-06-12 LAB — LACTIC ACID, PLASMA
Lactic Acid, Venous: 8.7 mmol/L (ref 0.5–1.9)
Lactic Acid, Venous: 5 mmol/L (ref 0.5–1.9)

## 2019-06-12 LAB — MAGNESIUM: Magnesium: 2.7 mg/dL — ABNORMAL HIGH (ref 1.7–2.4)

## 2019-06-12 LAB — CORTISOL: Cortisol, Plasma: 86.6 ug/dL

## 2019-06-12 MED ORDER — LACTATED RINGERS IV BOLUS
1000.0000 mL | Freq: Once | INTRAVENOUS | Status: AC
  Administered 2019-06-12: 1000 mL via INTRAVENOUS

## 2019-06-12 MED ORDER — SODIUM CHLORIDE 0.9 % IV SOLN
2.0000 g | Freq: Two times a day (BID) | INTRAVENOUS | Status: DC
  Administered 2019-06-12 – 2019-06-17 (×12): 2 g via INTRAVENOUS
  Filled 2019-06-12 (×14): qty 2

## 2019-06-12 MED ORDER — SODIUM BICARBONATE-DEXTROSE 150-5 MEQ/L-% IV SOLN
150.0000 meq | INTRAVENOUS | Status: DC
  Administered 2019-06-12 – 2019-06-13 (×4): 150 meq via INTRAVENOUS
  Filled 2019-06-12 (×5): qty 1000

## 2019-06-12 MED ORDER — ZINC CHLORIDE 1 MG/ML IV SOLN
INTRAVENOUS | Status: DC
  Filled 2019-06-12: qty 1318.4

## 2019-06-12 MED ORDER — ALBUMIN HUMAN 5 % IV SOLN
25.0000 g | Freq: Once | INTRAVENOUS | Status: AC
  Administered 2019-06-12: 25 g via INTRAVENOUS
  Filled 2019-06-12: qty 500

## 2019-06-12 MED ORDER — DEXTROSE 50 % IV SOLN
INTRAVENOUS | Status: AC
  Filled 2019-06-12: qty 50

## 2019-06-12 MED ORDER — INSULIN GLARGINE 100 UNIT/ML ~~LOC~~ SOLN
15.0000 [IU] | Freq: Two times a day (BID) | SUBCUTANEOUS | Status: DC
  Administered 2019-06-12 – 2019-06-13 (×3): 15 [IU] via SUBCUTANEOUS
  Filled 2019-06-12 (×6): qty 0.15

## 2019-06-12 MED ORDER — ALBUMIN HUMAN 5 % IV SOLN
12.5000 g | Freq: Once | INTRAVENOUS | Status: AC
  Administered 2019-06-12: 12.5 g via INTRAVENOUS

## 2019-06-12 MED ORDER — ALBUMIN HUMAN 5 % IV SOLN
INTRAVENOUS | Status: AC
  Filled 2019-06-12: qty 500

## 2019-06-12 MED ORDER — PIPERACILLIN-TAZOBACTAM 3.375 G IVPB 30 MIN
3.3750 g | Freq: Four times a day (QID) | INTRAVENOUS | Status: DC
  Filled 2019-06-12: qty 50

## 2019-06-12 MED ORDER — NOREPINEPHRINE 16 MG/250ML-% IV SOLN
0.0000 ug/min | INTRAVENOUS | Status: DC
  Administered 2019-06-12: 36 ug/min via INTRAVENOUS
  Administered 2019-06-13: 30 ug/min via INTRAVENOUS
  Administered 2019-06-13: 24 ug/min via INTRAVENOUS
  Administered 2019-06-15: 8 ug/min via INTRAVENOUS
  Administered 2019-06-17: 10 ug/min via INTRAVENOUS
  Filled 2019-06-12 (×7): qty 250

## 2019-06-12 MED ORDER — VANCOMYCIN HCL 2000 MG/400ML IV SOLN
2000.0000 mg | Freq: Once | INTRAVENOUS | Status: AC
  Administered 2019-06-12: 2000 mg via INTRAVENOUS
  Filled 2019-06-12: qty 400

## 2019-06-12 MED ORDER — VANCOMYCIN HCL IN DEXTROSE 1-5 GM/200ML-% IV SOLN: 1000.0000 mg | INTRAVENOUS | Status: DC

## 2019-06-12 MED ORDER — DEXTROSE 50 % IV SOLN
12.5000 g | Freq: Once | INTRAVENOUS | Status: AC
  Administered 2019-06-12: 12.5 g via INTRAVENOUS

## 2019-06-12 MED ORDER — SODIUM BICARBONATE 8.4 % IV SOLN
INTRAVENOUS | Status: AC
  Filled 2019-06-12: qty 100

## 2019-06-12 NOTE — Progress Notes (Signed)
Spoke with MD regarding hypotension and rapid breathing. Orders received.

## 2019-06-12 NOTE — Progress Notes (Signed)
Critical ABG results given to Dr. Isaiah Serge at pt bedside. Verbal order received to increase peep to 14 and RR to 30. RT will continue to monitor.

## 2019-06-12 NOTE — Progress Notes (Signed)
PHARMACY - TOTAL PARENTERAL NUTRITION CONSULT NOTE  Indication: Prolonged ileus  Patient Measurements: Height: '6\' 3"'$  (190.5 cm) Weight: 107.3 kg (236 lb 8.9 oz) IBW/kg (Calculated) : 84.5 TPN AdjBW (KG): 95.7 Body mass index is 29.57 kg/m.   Assessment:  11 YOM initially presented to the hospital s/p [facility]. Injuries include SAH, scapula fracture, rib fractures, pulmonary contusion, L hemothorax, T6-9 spinous process fracture and spleen laceration. Found to have an MSSA bacteremia and aspiration event that required intubation 06/02/19.  Now with significant ileus with massive gastric dilatation and emesis.   Glucose / Insulin: no hx DM - CBGs 78-250s.  Required 25 units rSSI + Lantus 25 bid (off hydrocortisone on 5/5) - however CBGs lowish since new TPN bag hung - rec to decrease lantus to 15 units bid  Electrolytes: no lytes in TPN except Na - K 5.3, low CO2, iCa slightly low, Phos 7.9, Mag 2.7  Renal: AKI on CRRT since 4/30, short interruption 5/6 - SCr 2.43, BUN down 74 LFTs / TGs: alk phos 152, AST/ALT 82/36, tbili 21 (jaundice), TG elevated at 840 (no lipids in TPN due to hypertriglyceridemia) Prealbumin / albumin: albumin 2.3, prealbumin down to 13 Intake / Output; MIVF: no UOP, CRRT 2711 ml, NGT output 2111, stool 500 mls, LBM 5/8 GI Imaging: 4/29 CT abd - highly concerning for bowel ischemia 4/29 Abd xary - concerning for distal small bowel obstruction or ileus 4/29 Abd xray - small bowel obstruction 4/26 CT abd - spenic lac, cholelithiasis 4/20 CT abd - spleen lac, hemorrhage near kidney likely from psoas muscle, cholelithiasis, diverticulosis with mild pericolonic soft tissue stranding  Surgeries / Procedures: N/A  Central access: CVC placed 06/02/19 TPN start date: 06/04/19  Nutritional Goals (per RD rec on 5/4): 1900 kCal, 178-220gm protein, >/= 2L fluid per day  Current Nutrition:  TPN Pivot 1.5 at 20 ml/hr = 720 kCal and 46g AA per day, advance once has a BM per MD (large  BM charted on 5/8) - not sure how much he is absorbing as he had more than 2100 mls of NG output  Plan:  Concentrated TPN at 80 ml/hr, providing 198g AA and 269g CHO for a total of 1705 kCal, meeting 90% of kCal and 100% of protein needs.  TF to meet the rest of need as able.   Hold ILE for first 7 days of TPN for critically ill patients per ASPEN guidelines.  Start date 5/8 - However TG are too high. Electrolytes in TPN: add Ca, max acetate Daily multivitamin in TPN Remove trace elements and add back selenium 78mg and zinc '5mg'$  Continue resistant SSI Q4H.  Decrease Lantus to 15 units BID (no insulin in TPN) F/U AM labs, CBGs, TF tolerance/advancement  CAlanda Slim PharmD, FSurgery Center Of Anaheim Hills LLCClinical Pharmacist Please see AMION for all Pharmacists' Contact Phone Numbers 06/12/2019, 7:33 AM
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
## 2019-06-12 NOTE — Progress Notes (Signed)
PCCM interval progress note:   Asked to evaluate patient overnight for worsening shock, rising lactic acid, on Phenylephrine, Vasopressin and Levophed on CRRT filtration only with bicarb gtt.   RN notes worsening diarrhea.  Covered with Vanc/Cefepime for aspiration PNA and ARDS.   CT abd/pelvis  4/29 with concern for bowel ischemia.    P: -check C. Diff -KUB without obvious free air -check LFT's, lactic may not be clearing -unable to obtain reliable O2 sat, follow ABG, Check Coox   Darcella Gasman Kingsley Farace, PA-C

## 2019-06-12 NOTE — Progress Notes (Signed)
CRITICAL VALUE ALERT  Critical Value:  Lactic Acid of 5.0  Date & Time Notied:  06/12/19 2341  Provider Notified: [place]

## 2019-06-12 NOTE — Progress Notes (Signed)
eLink Physician-Brief Progress Note Patient Name: Ian Velasquez DOB: [birth year] MRN: [id number]   Date of Service  06/12/2019  HPI/Events of Note  Request for bedside assessment. Patient seen intubated, ongoing CRRT, on bicarbonate drip, Neo, Levo, Vaso. BP 146/56  HR 102. ABG 7.27/50/539. Lactic acid 8.7  eICU Interventions  Titrate down FiO2/ PEEP as per ARDS net vent protocol. Titrating pressors. Repeat lactic acid tonight. [facility] CCM team informed.     Intervention Category Major Interventions: Respiratory failure - evaluation and management;Hypotension - evaluation and management  [name] 06/12/2019, 9:50 PM

## 2019-06-12 NOTE — Progress Notes (Signed)
Pt tachypneic &  continues to desat into the low 80s despite 100%FiO2. Increased PEEP to 8 per trauma.

## 2019-06-12 NOTE — Progress Notes (Signed)
Updated patient's wife Mannix Kroeker via phone. All questions answered.

## 2019-06-12 NOTE — Progress Notes (Signed)
Spoke with wife Inetta Fermo about increased need for pressors and albumin overnight.
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
## 2019-06-12 NOTE — Progress Notes (Signed)
Spoke with MD regarding change in ECG waveform and increased pressor needs. Orders received.

## 2019-06-12 NOTE — Progress Notes (Signed)
Spoke with MD about hypotension, large amount of OG output. Orders received.

## 2019-06-12 NOTE — Progress Notes (Signed)
Patient ID: Ian Velasquez, male   DOB: 06-21-1955, 64 y.o.   MRN: [id number] Follow up - Trauma Critical Care  Patient Details:    Ian Velasquez is an 64 y.o. male.  Lines/tubes : Airway 8 mm (Active)  Secured at (cm) 26 cm 06/11/19 0741  Measured From Lips 06/11/19 0741  Secured Location Right 06/11/19 0741  Secured By [employer] 06/11/19 0741  Tube Holder Repositioned Yes 06/11/19 0741  Cuff Pressure (cm H2O) 30 cm H2O 06/11/19 0741  Site Condition Dry 06/11/19 0741     CVC Triple Lumen 06/02/19 Left Subclavian (Active)  Indication for Insertion or Continuance of Line Administration of hyperosmolar/irritating solutions (i.e. TPN, Vancomycin, etc.) 06/10/19 2000  Site Assessment Dry;Clean;Intact 06/10/19 2000  Proximal Lumen Status Infusing 06/09/19 0800  Medial Lumen Status Infusing 06/09/19 0800  Distal Lumen Status Infusing 06/09/19 0800  Dressing Type Transparent;Occlusive 06/10/19 2000  Dressing Status Clean;Intact;Dry;Antimicrobial disc in place 06/10/19 2000  Line Care Connections checked and tightened 06/10/19 2000  Dressing Intervention New dressing 06/09/19 0610  Dressing Change Due 06/16/19 06/10/19 2000     NG/OG Tube Orogastric Left mouth (Active)  Site Assessment Clean;Dry;Intact 06/10/19 2000  Ongoing Placement Verification No change in cm markings or external length of tube from initial placement;No change in respiratory status;No acute changes, not attributed to clinical condition 06/09/19 2000  Status Infusing tube feed 06/10/19 2000  Amount of suction 80 mmHg 06/07/19 0800  Drainage Appearance Green;Brown 06/07/19 0800  Intake (mL) 60 mL 06/29/2019 1541  Output (mL) 350 mL 06/07/19 1800    Microbiology/Sepsis markers: Results for orders placed or performed during the hospital encounter of May 25, 2019  Respiratory Panel by RT PCR (Flu A&B, Covid) - Nasopharyngeal Swab     Status: None   Collection Time: May 25, 2019  5:23 PM   Specimen: Nasopharyngeal Swab   Result Value Ref Range Status   SARS Coronavirus 2 by RT PCR NEGATIVE NEGATIVE Final    Comment: (NOTE) SARS-CoV-2 target nucleic acids are NOT DETECTED. The SARS-CoV-2 RNA is generally detectable in upper respiratoy specimens during the acute phase of infection. The lowest concentration of SARS-CoV-2 viral copies this assay can detect is 131 copies/mL. A negative result does not preclude SARS-Cov-2 infection and should not be used as the sole basis for treatment or other patient management decisions. A negative result may occur with  improper specimen collection/handling, submission of specimen other than nasopharyngeal swab, presence of viral mutation(s) within the areas targeted by this assay, and inadequate number of viral copies (<131 copies/mL). A negative result must be combined with clinical observations, patient history, and epidemiological information. The expected result is Negative. Fact Sheet for Patients:  https://www.moore.com/ Fact Sheet for Healthcare Providers:  https://www.young.biz/ This test is not yet ap proved or cleared by the [place] FDA and  has been authorized for detection and/or diagnosis of SARS-CoV-2 by FDA under an Emergency Use Authorization (EUA). This EUA will remain  in effect (meaning this test can be used) for the duration of the COVID-19 declaration under Section 564(b)(1) of the Act, 21 U.S.C. section 360bbb-3(b)(1), unless the authorization is terminated or revoked sooner.    Influenza A by PCR NEGATIVE NEGATIVE Final   Influenza B by PCR NEGATIVE NEGATIVE Final    Comment: (NOTE) The Xpert Xpress SARS-CoV-2/FLU/RSV assay is intended as an aid in  the diagnosis of influenza from Nasopharyngeal swab specimens and  should not be used as a sole basis for treatment. Nasal washings and  aspirates are  unacceptable for Xpert Xpress SARS-CoV-2/FLU/RSV  testing. Fact Sheet for  Patients: https://www.moore.com/ Fact Sheet for Healthcare Providers: https://www.young.biz/ This test is not yet approved or cleared by the [place] FDA and  has been authorized for detection and/or diagnosis of SARS-CoV-2 by  FDA under an Emergency Use Authorization (EUA). This EUA will remain  in effect (meaning this test can be used) for the duration of the  Covid-19 declaration under Section 564(b)(1) of the Act, 21  U.S.C. section 360bbb-3(b)(1), unless the authorization is  terminated or revoked. Performed at [facility], [address], [place], [place] [address]   MRSA PCR Screening     Status: None   Collection Time: Jun 23, 2019  8:16 PM   Specimen: Nasopharyngeal  Result Value Ref Range Status   MRSA by PCR NEGATIVE NEGATIVE Final    Comment:        The GeneXpert MRSA Assay (FDA approved for NASAL specimens only), is one component of a comprehensive MRSA colonization surveillance program. It is not intended to diagnose MRSA infection nor to guide or monitor treatment for MRSA infections. Performed at [facility], [address] [place], [place] [address]   Culture, Urine     Status: Abnormal   Collection Time: 05/30/19  8:44 AM   Specimen: Urine, Clean Catch  Result Value Ref Range Status   Specimen Description URINE, CLEAN CATCH  Final   Special Requests NONE  Final   Culture (A)  Final    <10,000 COLONIES/mL INSIGNIFICANT GROWTH Performed at [facility], [address] [place], [place] [address]    Report Status 05/31/2019 FINAL  Final  Culture, blood (routine x 2)     Status: Abnormal   Collection Time: 05/30/19  9:50 AM   Specimen: BLOOD  Result Value Ref Range Status   Specimen Description BLOOD LEFT ANTECUBITAL  Final   Special Requests   Final    BOTTLES DRAWN AEROBIC ONLY Blood Culture results may not be optimal due to an inadequate volume of blood received in culture bottles   Culture   Setup Time   Final    GRAM POSITIVE COCCI IN CLUSTERS AEROBIC BOTTLE ONLY CRITICAL RESULT CALLED TO, READ BACK BY AND VERIFIED WITH: Melven [facility]orius [facility] 05/31/19 0013 JDW Performed at [facility], [address], [place], [place] [address]    Culture STAPHYLOCOCCUS AUREUS (A)  Final   Report Status 06/01/2019 FINAL  Final   Organism ID, Bacteria STAPHYLOCOCCUS AUREUS  Final      Susceptibility   Staphylococcus aureus - MIC*    CIPROFLOXACIN <=0.5 SENSITIVE Sensitive     ERYTHROMYCIN <=0.25 SENSITIVE Sensitive     GENTAMICIN <=0.5 SENSITIVE Sensitive     OXACILLIN 0.5 SENSITIVE Sensitive     TETRACYCLINE <=1 SENSITIVE Sensitive     VANCOMYCIN <=0.5 SENSITIVE Sensitive     TRIMETH/SULFA <=10 SENSITIVE Sensitive     CLINDAMYCIN <=0.25 SENSITIVE Sensitive     RIFAMPIN <=0.5 SENSITIVE Sensitive     Inducible Clindamycin NEGATIVE Sensitive     * STAPHYLOCOCCUS AUREUS  Blood Culture ID Panel (Reflexed)     Status: Abnormal   Collection Time: 05/30/19  9:50 AM  Result Value Ref Range Status   Enterococcus species NOT DETECTED NOT DETECTED Final   Listeria monocytogenes NOT DETECTED NOT DETECTED Final   Staphylococcus species DETECTED (A) NOT DETECTED Final    Comment: CRITICAL RESULT CALLED TO, READ BACK BY AND VERIFIED WITH: J [facility] 05/31/19  0013 JDW    Staphylococcus aureus (BCID) DETECTED (A) NOT DETECTED Final    Comment: Methicillin (oxacillin) susceptible Staphylococcus aureus (MSSA). Preferred therapy is anti staphylococcal beta lactam antibiotic (Cefazolin or Nafcillin), unless clinically contraindicated. CRITICAL RESULT CALLED TO, READ BACK BY AND VERIFIED WITH: J [facility] 05/31/19 0013 JDW    Methicillin resistance NOT DETECTED NOT DETECTED Final   Streptococcus species NOT DETECTED NOT DETECTED Final   Streptococcus agalactiae NOT DETECTED NOT DETECTED Final   Streptococcus pneumoniae NOT DETECTED NOT DETECTED Final   Streptococcus pyogenes NOT DETECTED NOT  DETECTED Final   Acinetobacter baumannii NOT DETECTED NOT DETECTED Final   Enterobacteriaceae species NOT DETECTED NOT DETECTED Final   Enterobacter cloacae complex NOT DETECTED NOT DETECTED Final   Escherichia coli NOT DETECTED NOT DETECTED Final   Klebsiella oxytoca NOT DETECTED NOT DETECTED Final   Klebsiella pneumoniae NOT DETECTED NOT DETECTED Final   Proteus species NOT DETECTED NOT DETECTED Final   Serratia marcescens NOT DETECTED NOT DETECTED Final   Haemophilus influenzae NOT DETECTED NOT DETECTED Final   Neisseria meningitidis NOT DETECTED NOT DETECTED Final   Pseudomonas aeruginosa NOT DETECTED NOT DETECTED Final   Candida albicans NOT DETECTED NOT DETECTED Final   Candida glabrata NOT DETECTED NOT DETECTED Final   Candida krusei NOT DETECTED NOT DETECTED Final   Candida parapsilosis NOT DETECTED NOT DETECTED Final   Candida tropicalis NOT DETECTED NOT DETECTED Final    Comment: Performed at [facility], [place] [address] [place], [place] [address]  Culture, blood (routine x 2)     Status: Abnormal   Collection Time: 05/30/19  9:55 AM   Specimen: BLOOD  Result Value Ref Range Status   Specimen Description BLOOD LEFT ANTECUBITAL  Final   Special Requests   Final    BOTTLES DRAWN AEROBIC ONLY Blood Culture results may not be optimal due to an inadequate volume of blood received in culture bottles   Culture  Setup Time   Final    AEROBIC BOTTLE ONLY GRAM POSITIVE COCCI IN CLUSTERS CRITICAL VALUE NOTED.  VALUE IS CONSISTENT WITH PREVIOUSLY REPORTED AND CALLED VALUE.    Culture (A)  Final    STAPHYLOCOCCUS AUREUS SUSCEPTIBILITIES PERFORMED ON PREVIOUS CULTURE WITHIN THE LAST 5 DAYS. Performed at [facility], [place] [address] [place], [place] [address]    Report Status 06/01/2019 FINAL  Final  Culture, blood (routine x 2)     Status: None   Collection Time: 05/31/19  7:12 AM   Specimen: BLOOD RIGHT HAND  Result Value Ref Range Status   Specimen Description BLOOD  RIGHT HAND  Final   Special Requests   Final    BOTTLES DRAWN AEROBIC ONLY Blood Culture results may not be optimal due to an inadequate volume of blood received in culture bottles   Culture   Final    NO GROWTH 5 DAYS Performed at [facility], [place] [address], [place],  [address]    Report Status 06/05/2019 FINAL  Final  Culture, blood (routine x 2)     Status: None   Collection Time: 05/31/19  7:45 AM   Specimen: BLOOD LEFT ARM  Result Value Ref Range Status   Specimen Description BLOOD LEFT ARM  Final   Special Requests   Final    BOTTLES DRAWN AEROBIC ONLY Blood Culture results may not be optimal due to an inadequate volume of blood received in culture bottles   Culture   Final  NO GROWTH 5 DAYS Performed at [facility], [place] [address] [place], [place] [address]    Report Status 06/05/2019 FINAL  Final  Culture, respiratory (non-expectorated)     Status: None   Collection Time: 06/02/19  9:17 AM   Specimen: Tracheal Aspirate; Respiratory  Result Value Ref Range Status   Specimen Description TRACHEAL ASPIRATE  Final   Special Requests Normal  Final   Gram Stain   Final    NO WBC SEEN ABUNDANT GRAM NEGATIVE RODS RARE YEAST Performed at [facility], [address], [place], [place] [address]    Culture ABUNDANT ESCHERICHIA COLI  Final   Report Status 06/04/2019 FINAL  Final   Organism ID, Bacteria ESCHERICHIA COLI  Final      Susceptibility   Escherichia coli - MIC*    AMPICILLIN >=32 RESISTANT Resistant     CEFAZOLIN 8 SENSITIVE Sensitive     CEFEPIME <=1 SENSITIVE Sensitive     CEFTAZIDIME <=1 SENSITIVE Sensitive     CEFTRIAXONE <=1 SENSITIVE Sensitive     CIPROFLOXACIN 1 SENSITIVE Sensitive     GENTAMICIN <=1 SENSITIVE Sensitive     IMIPENEM <=0.25 SENSITIVE Sensitive     TRIMETH/SULFA <=20 SENSITIVE Sensitive     AMPICILLIN/SULBACTAM >=32 RESISTANT Resistant     PIP/TAZO 64 INTERMEDIATE Intermediate     * ABUNDANT ESCHERICHIA COLI     Anti-infectives:  Anti-infectives (From admission, onward)   Start     Dose/Rate Route Frequency Ordered Stop   06/10/19 1400  nafcillin 12 g in sodium chloride 0.9 % 500 mL continuous infusion  Status:  Discontinued     12 g 20.8 mL/hr over 24 Hours Intravenous Every 24 hours 06/09/19 1533 06/09/19 1608   06/10/19 0200  nafcillin 12 g in sodium chloride 0.9 % 500 mL continuous infusion     12 g 20.8 mL/hr over 24 Hours Intravenous Every 24 hours 06/09/19 1608 06/21/19 2359   06/09/19 2124  ceFAZolin (ANCEF) IVPB 1 g/50 mL premix  Status:  Discontinued     1 g 100 mL/hr over 30 Minutes Intravenous Every 24 hours 06/09/19 0746 06/09/19 1301   06/09/19 1400  ceFAZolin (ANCEF) IVPB 2g/100 mL premix  Status:  Discontinued     2 g 200 mL/hr over 30 Minutes Intravenous Every 12 hours 06/09/19 1301 06/09/19 1533   06/05/19 2030  ceFAZolin (ANCEF) IVPB 2g/100 mL premix  Status:  Discontinued     2 g 200 mL/hr over 30 Minutes Intravenous Every 12 hours 06/05/19 0905 06/09/19 0746   06/03/19 2300  ceFEPIme (MAXIPIME) 2 g in sodium chloride 0.9 % 100 mL IVPB  Status:  Discontinued     2 g 200 mL/hr over 30 Minutes Intravenous Every 24 hours 06/03/19 0641 06/03/19 1831   06/03/19 2000  ceFEPIme (MAXIPIME) 2 g in sodium chloride 0.9 % 100 mL IVPB  Status:  Discontinued     2 g 200 mL/hr over 30 Minutes Intravenous Every 12 hours 06/03/19 1830 06/05/19 0905   06/02/19 1000  clindamycin (CLEOCIN) IVPB 600 mg  Status:  Discontinued     600 mg 100 mL/hr over 30 Minutes Intravenous Every 8 hours 06/02/19 0142 06/02/19 0812   06/02/19 0830  ceFEPIme (MAXIPIME) 2 g in sodium chloride 0.9 % 100 mL IVPB  Status:  Discontinued     2 g 200 mL/hr over 30 Minutes Intravenous Every 12 hours 06/02/19 0812 06/03/19 0641   06/02/19 0815  metroNIDAZOLE (FLAGYL) IVPB 500 mg  Status:  Discontinued     500 mg 100 mL/hr over 60 Minutes Intravenous Every 8 hours 06/02/19 0812 06/05/19 0905   06/02/19 0200   clindamycin (CLEOCIN) IVPB 600 mg     600 mg 100 mL/hr over 30 Minutes Intravenous  Once 06/02/19 0142 06/02/19 0254   05/31/19 1330  nafcillin 12 g in sodium chloride 0.9 % 500 mL continuous infusion  Status:  Discontinued     12 g 20.8 mL/hr over 24 Hours Intravenous Every 24 hours 05/31/19 1052 06/02/19 0812   05/31/19 0200  nafcillin 2 g in sodium chloride 0.9 % 100 mL IVPB  Status:  Discontinued     2 g 200 mL/hr over 30 Minutes Intravenous Every 4 hours 05/31/19 0055 05/31/19 1052   05/31/19 0115  nafcillin injection 2 g  Status:  Discontinued     2 g Intravenous Every 4 hours 05/31/19 0027 05/31/19 0054      Best Practice/Protocols:  VTE Prophylaxis: Heparin (SQ) Continous Sedation  Consults: Treatment Team:  Yolonda Kida, MD Estanislado Emms, MD    Studies:    Events:  Subjective:    Overnight Issues: Has been restarted on Levophed yesterday. Receiving albumin boluses with transient improvements in pressor requirement. Tube feeds D/C'd and placed back to wall suction after emesis yesterday; ETT suction thick and cream colored.  Objective:  Vital signs for last 24 hours: Temp:  [97 F (36.1 C)-98.4 F (36.9 C)] 98.4 F (36.9 C) (05/09 0422) Pulse Rate:  [89-108] 106 (05/09 0602) Resp:  [16-38] 35 (05/09 0700) BP: (51-189)/(11-131) 82/26 (05/09 0700) SpO2:  [75 %-100 %] 96 % (05/09 0602) FiO2 (%):  [40 %] 40 % (05/09 0305) Weight:  [107.3 kg] 107.3 kg (05/09 0500)  Hemodynamic parameters for last 24 hours:    Intake/Output from previous day: 05/08 0701 - 05/09 0700 In: 5004.4 [I.V.:4479.4; NG/GT:210; IV Piggyback:255] Out: 5271 [Emesis/NG output:2100; Stool:500]  Intake/Output this shift: Total I/O In: 87.2 [I.V.:80.3; IV Piggyback:7] Out: -   Vent settings for last 24 hours: Vent Mode: PRVC FiO2 (%):  [40 %] 40 % Set Rate:  [20 bmp] 20 bmp Vt Set:  [620 mL-670 mL] 670 mL PEEP:  [5 cmH20] 5 cmH20 Pressure Support:  [8 cmH20] 8  cmH20 Plateau Pressure:  [15 cmH20-26 cmH20] 26 cmH20  Physical Exam:  General: Tachypneic when sedation is lighter Neuro: Arouses to voice HEENT/Neck: ETT Resp: clear to auscultation bilaterally CVS: RRR GI: soft, mildly distended distended Extremities: edema 1+  Results for orders placed or performed during the hospital encounter of 05/22/2019 (from the past 24 hour(s))  Glucose, capillary     Status: Abnormal   Collection Time: 06/11/19 12:09 PM  Result Value Ref Range   Glucose-Capillary 166 (H) 70 - 99 mg/dL  Glucose, capillary     Status: Abnormal   Collection Time: 06/11/19  3:55 PM  Result Value Ref Range   Glucose-Capillary 214 (H) 70 - 99 mg/dL  Renal function panel (daily at 1600)     Status: Abnormal   Collection Time: 06/11/19  4:14 PM  Result Value Ref Range   Sodium 134 (L) 135 - 145 mmol/L   Potassium 4.5 3.5 - 5.1 mmol/L   Chloride 97 (L) 98 - 111 mmol/L   CO2 22 22 - 32 mmol/L   Glucose, Bld 264 (H) 70 - 99 mg/dL   BUN 86 (H) 8 - 23 mg/dL   Creatinine, Ser 6.73 (H) 0.61 - 1.24 mg/dL   Calcium  8.2 (L) 8.9 - 10.3 mg/dL   Phosphorus 7.6 (H) 2.5 - 4.6 mg/dL   Albumin 1.9 (L) 3.5 - 5.0 g/dL   GFR calc non Af Amer 25 (L) >60 mL/min   GFR calc Af Amer 29 (L) >60 mL/min   Anion gap 15 5 - 15  Glucose, capillary     Status: Abnormal   Collection Time: 06/11/19  8:23 PM  Result Value Ref Range   Glucose-Capillary 236 (H) 70 - 99 mg/dL  I-STAT 7, (LYTES, BLD GAS, ICA, H+H)     Status: Abnormal   Collection Time: 06/11/19  8:39 PM  Result Value Ref Range   pH, Arterial 7.295 (L) 7.350 - 7.450   pCO2 arterial 37.0 32.0 - 48.0 mmHg   pO2, Arterial 78 (L) 83.0 - 108.0 mmHg   Bicarbonate 18.0 (L) 20.0 - 28.0 mmol/L   TCO2 19 (L) 22 - 32 mmol/L   O2 Saturation 94.0 %   Acid-base deficit 8.0 (H) 0.0 - 2.0 mmol/L   Sodium 134 (L) 135 - 145 mmol/L   Potassium 4.9 3.5 - 5.1 mmol/L   Calcium, Ion 1.08 (L) 1.15 - 1.40 mmol/L   HCT 34.0 (L) 39.0 - 52.0 %   Hemoglobin  11.6 (L) 13.0 - 17.0 g/dL   Patient temperature 71.24 F    Sample type ARTERIAL   Comprehensive metabolic panel     Status: Abnormal   Collection Time: 06/11/19  8:40 PM  Result Value Ref Range   Sodium 135 135 - 145 mmol/L   Potassium 4.9 3.5 - 5.1 mmol/L   Chloride 97 (L) 98 - 111 mmol/L   CO2 18 (L) 22 - 32 mmol/L   Glucose, Bld 246 (H) 70 - 99 mg/dL   BUN 84 (H) 8 - 23 mg/dL   Creatinine, Ser 5.80 (H) 0.61 - 1.24 mg/dL   Calcium 8.1 (L) 8.9 - 10.3 mg/dL   Total Protein 6.7 6.5 - 8.1 g/dL   Albumin 1.9 (L) 3.5 - 5.0 g/dL   AST 998 (H) 15 - 41 U/L   ALT 55 (H) 0 - 44 U/L   Alkaline Phosphatase 164 (H) 38 - 126 U/L   Total Bilirubin 21.9 (HH) 0.3 - 1.2 mg/dL   GFR calc non Af Amer 24 (L) >60 mL/min   GFR calc Af Amer 28 (L) >60 mL/min   Anion gap 20 (H) 5 - 15  CBC     Status: Abnormal   Collection Time: 06/11/19  8:40 PM  Result Value Ref Range   WBC 20.1 (H) 4.0 - 10.5 K/uL   RBC 3.34 (L) 4.22 - 5.81 MIL/uL   Hemoglobin 9.9 (L) 13.0 - 17.0 g/dL   HCT 33.8 (L) 25.0 - 53.9 %   MCV 100.9 (H) 80.0 - 100.0 fL   MCH 29.6 26.0 - 34.0 pg   MCHC 29.4 (L) 30.0 - 36.0 g/dL   RDW 76.7 (H) 34.1 - 93.7 %   Platelets 460 (H) 150 - 400 K/uL   nRBC 2.0 (H) 0.0 - 0.2 %  Glucose, capillary     Status: Abnormal   Collection Time: 06/11/19 11:53 PM  Result Value Ref Range   Glucose-Capillary 125 (H) 70 - 99 mg/dL  Glucose, capillary     Status: None   Collection Time: 06/12/19  3:40 AM  Result Value Ref Range   Glucose-Capillary 81 70 - 99 mg/dL  Renal function panel (daily at 0500)     Status: Abnormal   Collection Time:  06/12/19  5:19 AM  Result Value Ref Range   Sodium 138 135 - 145 mmol/L   Potassium 5.3 (H) 3.5 - 5.1 mmol/L   Chloride 101 98 - 111 mmol/L   CO2 19 (L) 22 - 32 mmol/L   Glucose, Bld 94 70 - 99 mg/dL   BUN 74 (H) 8 - 23 mg/dL   Creatinine, Ser 1.77 (H) 0.61 - 1.24 mg/dL   Calcium 8.1 (L) 8.9 - 10.3 mg/dL   Phosphorus 7.9 (H) 2.5 - 4.6 mg/dL   Albumin 2.1 (L)  3.5 - 5.0 g/dL   GFR calc non Af Amer 27 (L) >60 mL/min   GFR calc Af Amer 31 (L) >60 mL/min   Anion gap 18 (H) 5 - 15  Magnesium     Status: Abnormal   Collection Time: 06/12/19  5:19 AM  Result Value Ref Range   Magnesium 2.7 (H) 1.7 - 2.4 mg/dL  CBC     Status: Abnormal   Collection Time: 06/12/19  5:19 AM  Result Value Ref Range   WBC 25.7 (H) 4.0 - 10.5 K/uL   RBC 3.23 (L) 4.22 - 5.81 MIL/uL   Hemoglobin 9.8 (L) 13.0 - 17.0 g/dL   HCT 93.9 (L) 03.0 - 09.2 %   MCV 102.8 (H) 80.0 - 100.0 fL   MCH 30.3 26.0 - 34.0 pg   MCHC 29.5 (L) 30.0 - 36.0 g/dL   RDW 33.0 (H) 07.6 - 22.6 %   Platelets 424 (H) 150 - 400 K/uL   nRBC 4.4 (H) 0.0 - 0.2 %  Glucose, capillary     Status: None   Collection Time: 06/12/19  7:16 AM  Result Value Ref Range   Glucose-Capillary 78 70 - 99 mg/dL    Assessment & Plan: Present on Admission: . Closed displaced fracture of body of left scapula . Traumatic brain injury with loss of consciousness (HCC) . OSA (obstructive sleep apnea) . RLS (restless legs syndrome) . Essential hypertension . Acute blood loss anemia . Multiple trauma . Tachycardia . Multiple closed fractures of ribs of left side . Abdominal distention . Closed left scapular fracture    LOS: 19 days   Additional comments:I reviewed the patient's new clinical lab test results. . [facility]  Acute hypoxic respiratory failurewith ARDS- much improved, weaning but got agitated, add seroquel SAH, concussion- NSGY c/s (Dr. Russella Dar head4/21, Keppra x7d for sz ppx,MR this AM showed TBI as expected. Sedation decreased and now more awake, purposeful Comminuted left scapula fx- slingfor now, orthoc/s (Dr. Elzie Rings, [facility], f/u in 2weeks Left rib fx2-5,displaced with flail segment of 4 and 5,small left hemothorax,leftpulmonarycontusion T6-9 spinous process fx Grade I spleen Incidental radiographic mild diverticulitis- monitor clinically Psoas hemorrhage Adrenal  mass, 3x3cm - needs outpt workup Staph aureus bacteremia - Nafcillin per ID through 06/21/19 per ID Aspiration PNA- completed ABC for PNA 5/5 - given shock, cream colored ETT secretions, planning to re-broaden to Zosyn today Septic shock- off steroids, levophed; Albumin Gluteal and possible latissimus dorsi hematoma - possibly the cause of bacteremia. As above Malnutrition - TNA - no tube feeds for time being given probable ileus in setting of shock Hyperbilirubinemia- likely reabsorption of scattered hematomas. AKI - CRRT per Renal, keep even, I D/W Dr. Arrie Aran at the bedside. FEN -large BM yesterday, TNA - back towards goal needs Hyperglycemia- persists, on TNA, lower sugars so decreasing lantus coverage today VTE - SCDs,SQH (AKI) Dispo - ICU. Hopefully extubation vs trach when things settle Critical Care Total Time*:  47 Minutes  Ian Velasquez, M.D. [facility] Use [email or web address] to contact on call provider  06/12/2019  *Care during the described time interval was provided by me. I have reviewed this patient's available data, including medical history, events of note, physical examination and test results as part of my evaluation.

## 2019-06-12 NOTE — Progress Notes (Signed)
After rounding with patient's nurse, Lily Peer, RN. This nurse contacted Dr. Cliffton Asters regarding patient's status. Confirmed that patient's condition is critical and likely secondary to recent aspiration event. Patient too unstable to transport for imaging, if needed. Continue current treatment and utilization of PCCM for ARDS/Shock management.   Please contact TRN if any further assistance is needed.  Everette Rank, RN, BSN, CCRN, [facility] Trauma Response Nurse [phone]

## 2019-06-12 NOTE — Progress Notes (Signed)
ABG drawn. Critical values given to Dr. Sheliah Hatch. No new orders at this time. RT will continue to monitor.

## 2019-06-12 NOTE — Progress Notes (Signed)
Pt O2 still in the 70s, with SBP in the 50s. Trauma MD paged again, order for albumin &  advised to call Nephro. Nephro notified. Neo gtt restarted, CXR & blood gas obtained, CCM consult requested.

## 2019-06-12 NOTE — Progress Notes (Addendum)
Pharmacy Antibiotic Note  Ian Velasquez is a 64 y.o. male admitted on 06/06/2019 with pneumonia.  Pharmacy has been consulted for Zosyn dosing. Patient is on CRRT.  [name] to last TA growing E. Coli that was intermediate to Zosyn - will change to Cefepime. Will also add vancomycin at this time.  Plan: Vancomycin 2000mg  IV x1 then 1000mg  q24h   Height: 6\' 3"  (190.5 cm) Weight: 107.3 kg (236 lb 8.9 oz) IBW/kg (Calculated) : 84.5  Temp (24hrs), Avg:97.9 F (36.6 C), Min:97 F (36.1 C), Max:98.4 F (36.9 C)  Recent Labs  Lab 06/09/19 0539 06/09/19 1703 06/10/19 0531 06/10/19 1640 06/11/19 0554 06/11/19 0555 06/11/19 1614 06/11/19 2040 06/12/19 0519 06/12/19 1252  WBC 21.1*  --  22.8*  --  17.3*  --   --  20.1* 25.7*  --   CREATININE 3.55*   < > 2.94*   < >  --  2.43* 2.56* 2.70* 2.43* 2.72*   < > = values in this interval not displayed.    Estimated Creatinine Clearance: 36.3 mL/min (A) (by C-G formula based on SCr of 2.72 mg/dL (H)).    No Known Allergies  Antimicrobials this admission: Cefepime 5/9 >>  Vancomycin 5/9 >>  Thank you for allowing pharmacy to be a part of this patient's care.  08/12/19, PharmD, BCPS Clinical Pharmacist [phone] Please check AMION for all [facility] Pharmacy numbers 06/12/2019

## 2019-06-12 NOTE — Progress Notes (Signed)
Keep pt positive 1000 ml on CRRT per Dr.Manam.

## 2019-06-12 NOTE — Progress Notes (Addendum)
Pharmacy Antibiotic Note  Ian Velasquez is a 64 y.o. male admitted on 05/12/2019 with pneumonia.  Pharmacy has been consulted for Zosyn dosing. Patient is on CRRT.  [name] to last TA growing E. Coli that was intermediate to Zosyn - will change to Cefepime  Plan: Start Cefepime 2 gm IV q12hr - dosing for CRRT Monitor dialysis, clinical status and C&S.   Height: 6\' 3"  (190.5 cm) Weight: 107.3 kg (236 lb 8.9 oz) IBW/kg (Calculated) : 84.5  Temp (24hrs), Avg:97.8 F (36.6 C), Min:97 F (36.1 C), Max:98.4 F (36.9 C)  Recent Labs  Lab 06/09/19 0539 06/09/19 1703 06/10/19 0531 06/10/19 0531 06/10/19 1640 06/11/19 0554 06/11/19 0555 06/11/19 1614 06/11/19 2040 06/12/19 0519  WBC 21.1*  --  22.8*  --   --  17.3*  --   --  20.1* 25.7*  CREATININE 3.55*   < > 2.94*   < > 2.73*  --  2.43* 2.56* 2.70* 2.43*   < > = values in this interval not displayed.    Estimated Creatinine Clearance: 40.7 mL/min (A) (by C-G formula based on SCr of 2.43 mg/dL (H)).    No Known Allergies  Antimicrobials this admission: Cefepime 5/9 >>   Thank you for allowing pharmacy to be a part of this patient's care.  7/9, PharmD, [facility] Clinical Pharmacist Please see AMION for all Pharmacists' Contact Phone Numbers 06/12/2019, 8:45 AM

## 2019-06-12 NOTE — Procedures (Signed)
Arterial Catheter Insertion Procedure Note Ian Velasquez [id number] [birth year]  Procedure: Insertion of Arterial Catheter  Indications: Blood pressure monitoring and Frequent blood sampling  Procedure Details Consent: Risks of procedure as well as the alternatives and risks of each were explained to the (patient/caregiver).  Consent for procedure obtained. Time Out: Verified patient identification, verified procedure, site/side was marked, verified correct patient position, special equipment/implants available, medications/allergies/relevent history reviewed, required imaging and test results available.  Performed  Maximum sterile technique was used including antiseptics, cap, gloves, gown, hand hygiene, mask and sheet. Skin prep: Chlorhexidine; local anesthetic administered 20 gauge catheter was inserted into left femoral artery using the Seldinger technique. ULTRASOUND GUIDANCE USED: YES Evaluation Blood flow good; BP tracing good. Complications: No apparent complications.  Chilton Greathouse MD [facility] Please see [email or web address] for pager details.  06/12/2019, 2:58 PM

## 2019-06-13 ENCOUNTER — Inpatient Hospital Stay (HOSPITAL_COMMUNITY): Payer: PRIVATE HEALTH INSURANCE

## 2019-06-13 DIAGNOSIS — Z515 Encounter for palliative care: Secondary | ICD-10-CM

## 2019-06-13 DIAGNOSIS — Z9911 Dependence on respirator [ventilator] status: Secondary | ICD-10-CM

## 2019-06-13 DIAGNOSIS — Z66 Do not resuscitate: Secondary | ICD-10-CM

## 2019-06-13 DIAGNOSIS — J80 Acute respiratory distress syndrome: Secondary | ICD-10-CM

## 2019-06-13 DIAGNOSIS — L8990 Pressure ulcer of unspecified site, unspecified stage: Secondary | ICD-10-CM | POA: Insufficient documentation

## 2019-06-13 DIAGNOSIS — R6521 Severe sepsis with septic shock: Secondary | ICD-10-CM

## 2019-06-13 DIAGNOSIS — R570 Cardiogenic shock: Secondary | ICD-10-CM

## 2019-06-13 LAB — URINE CULTURE
Culture: NO GROWTH
Special Requests: NORMAL

## 2019-06-13 LAB — HEPATIC FUNCTION PANEL
ALT: 243 U/L — ABNORMAL HIGH (ref 0–44)
AST: 816 U/L — ABNORMAL HIGH (ref 15–41)
Albumin: 2.2 g/dL — ABNORMAL LOW (ref 3.5–5.0)
Alkaline Phosphatase: 115 U/L (ref 38–126)
Bilirubin, Direct: 10.6 mg/dL — ABNORMAL HIGH (ref 0.0–0.2)
Indirect Bilirubin: 6.8 mg/dL — ABNORMAL HIGH (ref 0.3–0.9)
Total Bilirubin: 17.4 mg/dL — ABNORMAL HIGH (ref 0.3–1.2)
Total Protein: 5.4 g/dL — ABNORMAL LOW (ref 6.5–8.1)

## 2019-06-13 LAB — CBC
HCT: 24.2 % — ABNORMAL LOW (ref 39.0–52.0)
Hemoglobin: 7.1 g/dL — ABNORMAL LOW (ref 13.0–17.0)
MCH: 29.7 pg (ref 26.0–34.0)
MCHC: 29.3 g/dL — ABNORMAL LOW (ref 30.0–36.0)
MCV: 101.3 fL — ABNORMAL HIGH (ref 80.0–100.0)
Platelets: 299 10*3/uL (ref 150–400)
RBC: 2.39 MIL/uL — ABNORMAL LOW (ref 4.22–5.81)
RDW: 26.4 % — ABNORMAL HIGH (ref 11.5–15.5)
WBC: 24.6 10*3/uL — ABNORMAL HIGH (ref 4.0–10.5)
nRBC: 5.5 % — ABNORMAL HIGH (ref 0.0–0.2)

## 2019-06-13 LAB — COMPREHENSIVE METABOLIC PANEL
ALT: 287 U/L — ABNORMAL HIGH (ref 0–44)
AST: 972 U/L — ABNORMAL HIGH (ref 15–41)
Albumin: 2 g/dL — ABNORMAL LOW (ref 3.5–5.0)
Alkaline Phosphatase: 127 U/L — ABNORMAL HIGH (ref 38–126)
Anion gap: 15 (ref 5–15)
BUN: 70 mg/dL — ABNORMAL HIGH (ref 8–23)
CO2: 24 mmol/L (ref 22–32)
Calcium: 7.5 mg/dL — ABNORMAL LOW (ref 8.9–10.3)
Chloride: 99 mmol/L (ref 98–111)
Creatinine, Ser: 2.16 mg/dL — ABNORMAL HIGH (ref 0.61–1.24)
GFR calc Af Amer: 36 mL/min — ABNORMAL LOW (ref >60)
GFR calc non Af Amer: 31 mL/min — ABNORMAL LOW (ref >60)
Glucose, Bld: 146 mg/dL — ABNORMAL HIGH (ref 70–99)
Potassium: 4.3 mmol/L (ref 3.5–5.1)
Sodium: 138 mmol/L (ref 135–145)
Total Bilirubin: 17 mg/dL — ABNORMAL HIGH (ref 0.3–1.2)
Total Protein: 5.2 g/dL — ABNORMAL LOW (ref 6.5–8.1)

## 2019-06-13 LAB — TRIGLYCERIDES: Triglycerides: 229 mg/dL — ABNORMAL HIGH (ref <150)

## 2019-06-13 LAB — POCT I-STAT 7, (LYTES, BLD GAS, ICA,H+H)
Acid-Base Excess: 0 mmol/L (ref 0.0–2.0)
Acid-Base Excess: 0 mmol/L (ref 0.0–2.0)
Acid-Base Excess: 3 mmol/L — ABNORMAL HIGH (ref 0.0–2.0)
Bicarbonate: 25.4 mmol/L (ref 20.0–28.0)
Bicarbonate: 25.6 mmol/L (ref 20.0–28.0)
Bicarbonate: 28.7 mmol/L — ABNORMAL HIGH (ref 20.0–28.0)
Calcium, Ion: 1.02 mmol/L — ABNORMAL LOW (ref 1.15–1.40)
Calcium, Ion: 1.03 mmol/L — ABNORMAL LOW (ref 1.15–1.40)
Calcium, Ion: 1.11 mmol/L — ABNORMAL LOW (ref 1.15–1.40)
HCT: 24 % — ABNORMAL LOW (ref 39.0–52.0)
HCT: 25 % — ABNORMAL LOW (ref 39.0–52.0)
HCT: 25 % — ABNORMAL LOW (ref 39.0–52.0)
Hemoglobin: 8.2 g/dL — ABNORMAL LOW (ref 13.0–17.0)
Hemoglobin: 8.5 g/dL — ABNORMAL LOW (ref 13.0–17.0)
Hemoglobin: 8.5 g/dL — ABNORMAL LOW (ref 13.0–17.0)
O2 Saturation: 100 %
O2 Saturation: 100 %
O2 Saturation: 99 %
Patient temperature: 98.1
Patient temperature: 98.3
Patient temperature: 98.7
Potassium: 4.2 mmol/L (ref 3.5–5.1)
Potassium: 4.7 mmol/L (ref 3.5–5.1)
Potassium: 4.7 mmol/L (ref 3.5–5.1)
Sodium: 136 mmol/L (ref 135–145)
Sodium: 136 mmol/L (ref 135–145)
Sodium: 137 mmol/L (ref 135–145)
TCO2: 27 mmol/L (ref 22–32)
TCO2: 27 mmol/L (ref 22–32)
TCO2: 30 mmol/L (ref 22–32)
pCO2 arterial: 44.8 mmHg (ref 32.0–48.0)
pCO2 arterial: 45 mmHg (ref 32.0–48.0)
pCO2 arterial: 48.2 mmHg — ABNORMAL HIGH (ref 32.0–48.0)
pH, Arterial: 7.359 (ref 7.350–7.450)
pH, Arterial: 7.366 (ref 7.350–7.450)
pH, Arterial: 7.382 (ref 7.350–7.450)
pO2, Arterial: 149 mmHg — ABNORMAL HIGH (ref 83.0–108.0)
pO2, Arterial: 200 mmHg — ABNORMAL HIGH (ref 83.0–108.0)
pO2, Arterial: 296 mmHg — ABNORMAL HIGH (ref 83.0–108.0)

## 2019-06-13 LAB — DIFFERENTIAL
Abs Immature Granulocytes: 0.82 10*3/uL — ABNORMAL HIGH (ref 0.00–0.07)
Basophils Absolute: 0.1 10*3/uL (ref 0.0–0.1)
Basophils Relative: 0 %
Eosinophils Absolute: 0 10*3/uL (ref 0.0–0.5)
Eosinophils Relative: 0 %
Immature Granulocytes: 3 %
Lymphocytes Relative: 7 %
Lymphs Abs: 1.6 10*3/uL (ref 0.7–4.0)
Monocytes Absolute: 1.5 10*3/uL — ABNORMAL HIGH (ref 0.1–1.0)
Monocytes Relative: 6 %
Neutro Abs: 20.5 10*3/uL — ABNORMAL HIGH (ref 1.7–7.7)
Neutrophils Relative %: 84 %

## 2019-06-13 LAB — POCT I-STAT EG7
Acid-Base Excess: 1 mmol/L (ref 0.0–2.0)
Bicarbonate: 27.6 mmol/L (ref 20.0–28.0)
Calcium, Ion: 1.03 mmol/L — ABNORMAL LOW (ref 1.15–1.40)
HCT: 26 % — ABNORMAL LOW (ref 39.0–52.0)
Hemoglobin: 8.8 g/dL — ABNORMAL LOW (ref 13.0–17.0)
O2 Saturation: 51 %
Patient temperature: 98.1
Potassium: 4.7 mmol/L (ref 3.5–5.1)
Sodium: 137 mmol/L (ref 135–145)
TCO2: 29 mmol/L (ref 22–32)
pCO2, Ven: 55.4 mmHg (ref 44.0–60.0)
pH, Ven: 7.304 (ref 7.250–7.430)
pO2, Ven: 30 mmHg — CL (ref 32.0–45.0)

## 2019-06-13 LAB — GLUCOSE, CAPILLARY
Glucose-Capillary: 103 mg/dL — ABNORMAL HIGH (ref 70–99)
Glucose-Capillary: 121 mg/dL — ABNORMAL HIGH (ref 70–99)
Glucose-Capillary: 133 mg/dL — ABNORMAL HIGH (ref 70–99)
Glucose-Capillary: 133 mg/dL — ABNORMAL HIGH (ref 70–99)
Glucose-Capillary: 91 mg/dL (ref 70–99)
Glucose-Capillary: 94 mg/dL (ref 70–99)
Glucose-Capillary: 99 mg/dL (ref 70–99)

## 2019-06-13 LAB — RENAL FUNCTION PANEL
Albumin: 2 g/dL — ABNORMAL LOW (ref 3.5–5.0)
Anion gap: 14 (ref 5–15)
BUN: 74 mg/dL — ABNORMAL HIGH (ref 8–23)
CO2: 23 mmol/L (ref 22–32)
Calcium: 8.1 mg/dL — ABNORMAL LOW (ref 8.9–10.3)
Chloride: 100 mmol/L (ref 98–111)
Creatinine, Ser: 2.26 mg/dL — ABNORMAL HIGH (ref 0.61–1.24)
GFR calc Af Amer: 34 mL/min — ABNORMAL LOW (ref >60)
GFR calc non Af Amer: 30 mL/min — ABNORMAL LOW (ref >60)
Glucose, Bld: 117 mg/dL — ABNORMAL HIGH (ref 70–99)
Phosphorus: 5.8 mg/dL — ABNORMAL HIGH (ref 2.5–4.6)
Potassium: 5.1 mmol/L (ref 3.5–5.1)
Sodium: 137 mmol/L (ref 135–145)

## 2019-06-13 LAB — PREALBUMIN: Prealbumin: 16.3 mg/dL — ABNORMAL LOW (ref 18–38)

## 2019-06-13 LAB — PREPARE RBC (CROSSMATCH)

## 2019-06-13 LAB — OCCULT BLOOD X 1 CARD TO LAB, STOOL: Fecal Occult Bld: POSITIVE — AB

## 2019-06-13 LAB — PHOSPHORUS: Phosphorus: 5 mg/dL — ABNORMAL HIGH (ref 2.5–4.6)

## 2019-06-13 LAB — C DIFFICILE QUICK SCREEN W PCR REFLEX
C Diff antigen: NEGATIVE
C Diff interpretation: NOT DETECTED
C Diff toxin: NEGATIVE

## 2019-06-13 LAB — LACTIC ACID, PLASMA: Lactic Acid, Venous: 3.2 mmol/L (ref 0.5–1.9)

## 2019-06-13 LAB — MAGNESIUM: Magnesium: 2.4 mg/dL (ref 1.7–2.4)

## 2019-06-13 LAB — ECHOCARDIOGRAM LIMITED
Height: 75 in
Weight: 3784.86 oz

## 2019-06-13 LAB — ABO/RH: ABO/RH(D): A POS

## 2019-06-13 MED ORDER — HYDROCORTISONE NA SUCCINATE PF 100 MG IJ SOLR
100.0000 mg | Freq: Three times a day (TID) | INTRAMUSCULAR | Status: DC
  Administered 2019-06-13 – 2019-06-14 (×4): 100 mg via INTRAVENOUS
  Filled 2019-06-13 (×4): qty 2

## 2019-06-13 MED ORDER — IOHEXOL 350 MG/ML SOLN
80.0000 mL | Freq: Once | INTRAVENOUS | Status: AC | PRN
  Administered 2019-06-13: 80 mL via INTRAVENOUS

## 2019-06-13 MED ORDER — METRONIDAZOLE IN NACL 5-0.79 MG/ML-% IV SOLN
500.0000 mg | Freq: Three times a day (TID) | INTRAVENOUS | Status: DC
  Administered 2019-06-13 – 2019-06-14 (×4): 500 mg via INTRAVENOUS
  Filled 2019-06-13 (×4): qty 100

## 2019-06-13 MED ORDER — PANTOPRAZOLE SODIUM 40 MG IV SOLR
40.0000 mg | Freq: Two times a day (BID) | INTRAVENOUS | Status: DC
  Administered 2019-06-13 – 2019-06-17 (×9): 40 mg via INTRAVENOUS
  Filled 2019-06-13 (×9): qty 40

## 2019-06-13 MED ORDER — HYDROCORTISONE NICU INJ SYRINGE 50 MG/ML: 100.0000 mg | Freq: Three times a day (TID) | INTRAVENOUS | Status: DC

## 2019-06-13 MED ORDER — FENTANYL CITRATE (PF) 2500 MCG/50ML IJ SOLN
0.0000 ug/h | Status: DC
  Administered 2019-06-13: 25 ug/h via INTRAVENOUS
  Administered 2019-06-15: 35 ug/h via INTRAVENOUS
  Administered 2019-06-17: 150 ug/h via INTRAVENOUS
  Administered 2019-06-18: 250 ug/h via INTRAVENOUS
  Filled 2019-06-13 (×6): qty 100

## 2019-06-13 MED ORDER — SODIUM CHLORIDE 0.9% IV SOLUTION: Freq: Once | INTRAVENOUS | Status: AC

## 2019-06-13 MED ORDER — VANCOMYCIN HCL 1000 MG IV SOLR
1000.0000 mg | INTRAVENOUS | Status: DC
  Administered 2019-06-13: 1000 mg via INTRAVENOUS
  Filled 2019-06-13 (×2): qty 1000

## 2019-06-13 MED ORDER — ZINC CHLORIDE 1 MG/ML IV SOLN
INTRAVENOUS | Status: DC
  Filled 2019-06-13: qty 1318.4

## 2019-06-13 MED ORDER — PHENYLEPHRINE CONCENTRATED 100MG/250ML (0.4 MG/ML) INFUSION SIMPLE
0.0000 ug/min | INTRAVENOUS | Status: DC
  Filled 2019-06-13: qty 250

## 2019-06-13 MED ORDER — DEXTROSE 10 % IV SOLN: INTRAVENOUS | Status: AC
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
## 2019-06-13 NOTE — Progress Notes (Signed)
SLP Cancellation Note  Patient Details Name: Ian Velasquez MRN: [id number] DOB: 12-Feb-1955   Cancelled treatment:       Reason Eval/Treat Not Completed: Patient not medically ready. Pt remains intubated. Will sign off at this time. Please reorder when ready for SLP interventions.    Rollin Kotowski, Riley Nearing 06/13/2019, 7:49 AM
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
## 2019-06-13 NOTE — Progress Notes (Signed)
CRITICAL VALUE ALERT  Critical Value:  K+ 5.1  Date & Time Notied:  06/13/2019 @1800   Provider Notified: Dr  Orders Received/Actions taken: No new orders at this time.
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
## 2019-06-13 NOTE — Progress Notes (Signed)
PT Cancellation Note  Patient Details Name: Ian Velasquez MRN: [id number] DOB: 10/11/1955   Cancelled Treatment:    Reason Eval/Treat Not Completed: Patient not medically ready. Pt now on 12 of PEEP and FiO2 of 100%. Pt with medical decline over the weekend. PT dec frequency to 2x/wk and will monitor pt's medically stability to progress mobility as able, as appropriate.  Lewis Shock, PT, DPT Acute Rehabilitation Services Pager #: [phone] Office #: [phone]    [name] 06/13/2019, 8:54 AM

## 2019-06-13 NOTE — Progress Notes (Signed)
  Echocardiogram 2D Echocardiogram has been performed.  Ian Velasquez 06/13/2019, 10:21 AM

## 2019-06-13 NOTE — Progress Notes (Signed)
OT Cancellation Note  Patient Details Name: Ian Velasquez MRN: [id number] DOB: Mar 17, 1955   Cancelled Treatment:    Reason Eval/Treat Not Completed: Patient not medically ready;Medical issues which prohibited therapy. Pt now on 12 of PEEP and FiO2 of 100%. Pt with medical decline over the weekend.  Thornell Mule, OT/L   Acute OT Clinical Specialist Acute Rehabilitation Services Pager [phone] Office [phone]  06/13/2019, 9:07 AM
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
## 2019-06-13 NOTE — Progress Notes (Signed)
CRITICAL VALUE ALERT  Critical Value:  Venous PO2 of 30  Date & Time Notied:  5/10 0030  Provider Notified: [place]  Orders Received/Actions taken: none
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
## 2019-06-14 DIAGNOSIS — Z7189 Other specified counseling: Secondary | ICD-10-CM

## 2019-06-14 DIAGNOSIS — R0603 Acute respiratory distress: Secondary | ICD-10-CM

## 2019-06-14 LAB — POCT I-STAT 7, (LYTES, BLD GAS, ICA,H+H)
Acid-Base Excess: 0 mmol/L (ref 0.0–2.0)
Bicarbonate: 25.8 mmol/L (ref 20.0–28.0)
Calcium, Ion: 1.2 mmol/L (ref 1.15–1.40)
HCT: 27 % — ABNORMAL LOW (ref 39.0–52.0)
Hemoglobin: 9.2 g/dL — ABNORMAL LOW (ref 13.0–17.0)
O2 Saturation: 95 %
Patient temperature: 96.3
Potassium: 4.6 mmol/L (ref 3.5–5.1)
Sodium: 133 mmol/L — ABNORMAL LOW (ref 135–145)
TCO2: 27 mmol/L (ref 22–32)
pCO2 arterial: 42.3 mmHg (ref 32.0–48.0)
pH, Arterial: 7.387 (ref 7.350–7.450)
pO2, Arterial: 74 mmHg — ABNORMAL LOW (ref 83.0–108.0)

## 2019-06-14 LAB — RENAL FUNCTION PANEL
Albumin: 1.7 g/dL — ABNORMAL LOW (ref 3.5–5.0)
Albumin: 1.5 g/dL — ABNORMAL LOW (ref 3.5–5.0)
Anion gap: 13 (ref 5–15)
Anion gap: 13 (ref 5–15)
BUN: 59 mg/dL — ABNORMAL HIGH (ref 8–23)
BUN: 52 mg/dL — ABNORMAL HIGH (ref 8–23)
CO2: 22 mmol/L (ref 22–32)
CO2: 22 mmol/L (ref 22–32)
Calcium: 7.9 mg/dL — ABNORMAL LOW (ref 8.9–10.3)
Calcium: 8.1 mg/dL — ABNORMAL LOW (ref 8.9–10.3)
Chloride: 100 mmol/L (ref 98–111)
Chloride: 102 mmol/L (ref 98–111)
Creatinine, Ser: 1.97 mg/dL — ABNORMAL HIGH (ref 0.61–1.24)
Creatinine, Ser: 1.91 mg/dL — ABNORMAL HIGH (ref 0.61–1.24)
GFR calc Af Amer: 40 mL/min — ABNORMAL LOW (ref >60)
GFR calc Af Amer: 42 mL/min — ABNORMAL LOW (ref >60)
GFR calc non Af Amer: 35 mL/min — ABNORMAL LOW (ref >60)
GFR calc non Af Amer: 36 mL/min — ABNORMAL LOW (ref >60)
Glucose, Bld: 130 mg/dL — ABNORMAL HIGH (ref 70–99)
Glucose, Bld: 123 mg/dL — ABNORMAL HIGH (ref 70–99)
Phosphorus: 5.3 mg/dL — ABNORMAL HIGH (ref 2.5–4.6)
Phosphorus: 4.7 mg/dL — ABNORMAL HIGH (ref 2.5–4.6)
Potassium: 4.4 mmol/L (ref 3.5–5.1)
Potassium: 4.6 mmol/L (ref 3.5–5.1)
Sodium: 135 mmol/L (ref 135–145)
Sodium: 137 mmol/L (ref 135–145)

## 2019-06-14 LAB — CBC
HCT: 25 % — ABNORMAL LOW (ref 39.0–52.0)
Hemoglobin: 7.7 g/dL — ABNORMAL LOW (ref 13.0–17.0)
MCH: 30.7 pg (ref 26.0–34.0)
MCHC: 30.8 g/dL (ref 30.0–36.0)
MCV: 99.6 fL (ref 80.0–100.0)
Platelets: 245 10*3/uL (ref 150–400)
RBC: 2.51 MIL/uL — ABNORMAL LOW (ref 4.22–5.81)
RDW: 25.5 % — ABNORMAL HIGH (ref 11.5–15.5)
WBC: 18.1 10*3/uL — ABNORMAL HIGH (ref 4.0–10.5)
nRBC: 0.9 % — ABNORMAL HIGH (ref 0.0–0.2)

## 2019-06-14 LAB — BPAM RBC
Blood Product Expiration Date: 202106042359
ISSUE DATE / TIME: 202105101126
Unit Type and Rh: 6200

## 2019-06-14 LAB — APTT: aPTT: 40 seconds — ABNORMAL HIGH (ref 24–36)

## 2019-06-14 LAB — GLUCOSE, CAPILLARY
Glucose-Capillary: 109 mg/dL — ABNORMAL HIGH (ref 70–99)
Glucose-Capillary: 124 mg/dL — ABNORMAL HIGH (ref 70–99)
Glucose-Capillary: 118 mg/dL — ABNORMAL HIGH (ref 70–99)
Glucose-Capillary: 110 mg/dL — ABNORMAL HIGH (ref 70–99)
Glucose-Capillary: 110 mg/dL — ABNORMAL HIGH (ref 70–99)
Glucose-Capillary: 114 mg/dL — ABNORMAL HIGH (ref 70–99)

## 2019-06-14 LAB — TYPE AND SCREEN
ABO/RH(D): A POS
Antibody Screen: NEGATIVE
Unit division: 0

## 2019-06-14 LAB — FIBRINOGEN: Fibrinogen: 694 mg/dL — ABNORMAL HIGH (ref 210–475)

## 2019-06-14 LAB — PROTIME-INR
INR: 1.4 — ABNORMAL HIGH (ref 0.8–1.2)
Prothrombin Time: 16.4 seconds — ABNORMAL HIGH (ref 11.4–15.2)

## 2019-06-14 LAB — MAGNESIUM: Magnesium: 2.6 mg/dL — ABNORMAL HIGH (ref 1.7–2.4)

## 2019-06-14 NOTE — Progress Notes (Signed)
Patient ID: Ian Velasquez, male   DOB: [birth year], 64 y.o.   MRN: [id number] I met with his wife at the bedside. He has made some improvement so we will continue full support for now and re-evaluate daily. DNR confirmed.  Georganna Skeans, MD, MPH, FACS Please use [email or web address] to contact on call provider
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
## 2019-06-14 NOTE — Progress Notes (Signed)
Patient ID: Ian Velasquez, male   DOB: 01/16/1956, 64 y.o.   MRN: [id number] Follow up - Trauma Critical Care  Patient Details:    Ian Velasquez is an 64 y.o. male.  Lines/tubes : Airway 8 mm (Active)  Secured at (cm) 26 cm 06/14/19 0349  Measured From Lips 06/14/19 0349  Secured Location Center 06/14/19 0349  Secured By [employer] Tube Holder 06/14/19 0349  Tube Holder Repositioned Yes 06/14/19 0349  Cuff Pressure (cm H2O) 28 cm H2O 06/13/19 1937  Site Condition Dry 06/14/19 0349     CVC Triple Lumen 06/02/19 Left Subclavian (Active)  Indication for Insertion or Continuance of Line Vasoactive infusions 06/14/19 0724  Site Assessment Clean;Dry;Intact 06/13/19 2000  Proximal Lumen Status Infusing 06/13/19 2000  Medial Lumen Status Infusing 06/13/19 2000  Distal Lumen Status Infusing 06/13/19 2000  Dressing Type Transparent;Occlusive 06/13/19 2000  Dressing Status Clean;Dry;Intact;Antimicrobial disc in place 06/13/19 2000  Line Care Connections checked and tightened 06/13/19 2000  Dressing Intervention Other (Comment) 06/13/19 0800  Dressing Change Due 06/16/19 06/13/19 2000     Arterial Line 06/12/19 Right Femoral (Active)  Site Assessment Clean;Dry;Intact 06/13/19 2000  Line Status Pulsatile blood flow 06/13/19 2000  Art Line Waveform Appropriate;Square wave test performed 06/13/19 2000  Art Line Interventions Zeroed and calibrated;Leveled;Connections checked and tightened 06/13/19 2000  Color/Movement/Sensation Capillary refill less than 3 sec 06/13/19 2000  Dressing Type Transparent;Occlusive 06/13/19 2000  Dressing Status Clean;Dry;Intact;Antimicrobial disc in place 06/13/19 2000  Interventions Other (Comment) 06/13/19 0800  Dressing Change Due 06/19/19 06/13/19 2000     NG/OG Tube Orogastric Left mouth (Active)  Site Assessment Clean;Dry;Intact 06/13/19 2000  Ongoing Placement Verification Xray;No acute changes, not attributed to clinical condition 06/13/19 2000  Status  Suction-low intermittent 06/13/19 2000  Amount of suction 80 mmHg 06/12/19 0800  Drainage Appearance Green;Brown 06/13/19 2000  Intake (mL) 60 mL 06/23/2019 1541  Output (mL) 0 mL 06/14/19 0600     Rectal Tube/Pouch (Active)  Output (mL) 0 mL 06/14/19 0600    Microbiology/Sepsis markers: Results for orders placed or performed during the hospital encounter of Feb 26, 2019  Respiratory Panel by RT PCR (Flu A&B, Covid) - Nasopharyngeal Swab     Status: None   Collection Time: Feb 26, 2019  5:23 PM   Specimen: Nasopharyngeal Swab  Result Value Ref Range Status   SARS Coronavirus 2 by RT PCR NEGATIVE NEGATIVE Final    Comment: (NOTE) SARS-CoV-2 target nucleic acids are NOT DETECTED. The SARS-CoV-2 RNA is generally detectable in upper respiratoy specimens during the acute phase of infection. The lowest concentration of SARS-CoV-2 viral copies this assay can detect is 131 copies/mL. A negative result does not preclude SARS-Cov-2 infection and should not be used as the sole basis for treatment or other patient management decisions. A negative result may occur with  improper specimen collection/handling, submission of specimen other than nasopharyngeal swab, presence of viral mutation(s) within the areas targeted by this assay, and inadequate number of viral copies (<131 copies/mL). A negative result must be combined with clinical observations, patient history, and epidemiological information. The expected result is Negative. Fact Sheet for Patients:  https://www.moore.com/https://www.fda.gov/media/142436/download Fact Sheet for Healthcare Providers:  https://www.young.biz/https://www.fda.gov/media/142435/download This test is not yet ap proved or cleared by the [place] FDA and  has been authorized for detection and/or diagnosis of SARS-CoV-2 by FDA under an Emergency Use Authorization (EUA). This EUA will remain  in effect (meaning this test can be used) for the duration of the COVID-19 declaration under Section 564(b)(1) of the  Act, 21 U.S.C. section 360bbb-3(b)(1), unless the authorization is terminated or revoked sooner.    Influenza A by PCR NEGATIVE NEGATIVE Final   Influenza B by PCR NEGATIVE NEGATIVE Final    Comment: (NOTE) The Xpert Xpress SARS-CoV-2/FLU/RSV assay is intended as an aid in  the diagnosis of influenza from Nasopharyngeal swab specimens and  should not be used as a sole basis for treatment. Nasal washings and  aspirates are unacceptable for Xpert Xpress SARS-CoV-2/FLU/RSV  testing. Fact Sheet for Patients: https://www.moore.com/ Fact Sheet for Healthcare Providers: https://www.young.biz/ This test is not yet approved or cleared by the [place] FDA and  has been authorized for detection and/or diagnosis of SARS-CoV-2 by  FDA under an Emergency Use Authorization (EUA). This EUA will remain  in effect (meaning this test can be used) for the duration of the  Covid-19 declaration under Section 564(b)(1) of the Act, 21  U.S.C. section 360bbb-3(b)(1), unless the authorization is  terminated or revoked. Performed at [facility], [address], [place], [place] [address]   MRSA PCR Screening     Status: None   Collection Time: 06/02/2019  8:16 PM   Specimen: Nasopharyngeal  Result Value Ref Range Status   MRSA by PCR NEGATIVE NEGATIVE Final    Comment:        The GeneXpert MRSA Assay (FDA approved for NASAL specimens only), is one component of a comprehensive MRSA colonization surveillance program. It is not intended to diagnose MRSA infection nor to guide or monitor treatment for MRSA infections. Performed at [facility], [address] [place], [place] [address]   Culture, Urine     Status: Abnormal   Collection Time: 05/30/19  8:44 AM   Specimen: Urine, Clean Catch  Result Value Ref Range Status   Specimen Description URINE, CLEAN CATCH  Final   Special Requests NONE  Final   Culture (A)  Final    <10,000 COLONIES/mL  INSIGNIFICANT GROWTH Performed at [facility], [address] [place], [place] [address]    Report Status 05/31/2019 FINAL  Final  Culture, blood (routine x 2)     Status: Abnormal   Collection Time: 05/30/19  9:50 AM   Specimen: BLOOD  Result Value Ref Range Status   Specimen Description BLOOD LEFT ANTECUBITAL  Final   Special Requests   Final    BOTTLES DRAWN AEROBIC ONLY Blood Culture results may not be optimal due to an inadequate volume of blood received in culture bottles   Culture  Setup Time   Final    GRAM POSITIVE COCCI IN CLUSTERS AEROBIC BOTTLE ONLY CRITICAL RESULT CALLED TO, READ BACK BY AND VERIFIED WITH: Melven Sartorius [facility] 05/31/19 0013 JDW Performed at [facility], [address], [place], [place] [address]    Culture STAPHYLOCOCCUS AUREUS (A)  Final   Report Status 06/01/2019 FINAL  Final   Organism ID, Bacteria STAPHYLOCOCCUS AUREUS  Final      Susceptibility   Staphylococcus aureus - MIC*    CIPROFLOXACIN <=0.5 SENSITIVE Sensitive     ERYTHROMYCIN <=0.25 SENSITIVE Sensitive     GENTAMICIN <=0.5 SENSITIVE Sensitive     OXACILLIN 0.5 SENSITIVE Sensitive     TETRACYCLINE <=1 SENSITIVE Sensitive     VANCOMYCIN <=0.5 SENSITIVE Sensitive     TRIMETH/SULFA <=10 SENSITIVE Sensitive     CLINDAMYCIN <=0.25 SENSITIVE Sensitive     RIFAMPIN <=0.5 SENSITIVE Sensitive     Inducible Clindamycin NEGATIVE Sensitive     *  STAPHYLOCOCCUS AUREUS  Blood Culture ID Panel (Reflexed)     Status: Abnormal   Collection Time: 05/30/19  9:50 AM  Result Value Ref Range Status   Enterococcus species NOT DETECTED NOT DETECTED Final   Listeria monocytogenes NOT DETECTED NOT DETECTED Final   Staphylococcus species DETECTED (A) NOT DETECTED Final    Comment: CRITICAL RESULT CALLED TO, READ BACK BY AND VERIFIED WITH: J LEDFORD PHARMD 05/31/19 0013 JDW    Staphylococcus aureus (BCID) DETECTED (A) NOT DETECTED Final    Comment: Methicillin (oxacillin) susceptible Staphylococcus  aureus (MSSA). Preferred therapy is anti staphylococcal beta lactam antibiotic (Cefazolin or Nafcillin), unless clinically contraindicated. CRITICAL RESULT CALLED TO, READ BACK BY AND VERIFIED WITH: J LEDFORD [facility] 05/31/19 0013 JDW    Methicillin resistance NOT DETECTED NOT DETECTED Final   Streptococcus species NOT DETECTED NOT DETECTED Final   Streptococcus agalactiae NOT DETECTED NOT DETECTED Final   Streptococcus pneumoniae NOT DETECTED NOT DETECTED Final   Streptococcus pyogenes NOT DETECTED NOT DETECTED Final   Acinetobacter baumannii NOT DETECTED NOT DETECTED Final   Enterobacteriaceae species NOT DETECTED NOT DETECTED Final   Enterobacter cloacae complex NOT DETECTED NOT DETECTED Final   Escherichia coli NOT DETECTED NOT DETECTED Final   Klebsiella oxytoca NOT DETECTED NOT DETECTED Final   Klebsiella pneumoniae NOT DETECTED NOT DETECTED Final   Proteus species NOT DETECTED NOT DETECTED Final   Serratia marcescens NOT DETECTED NOT DETECTED Final   Haemophilus influenzae NOT DETECTED NOT DETECTED Final   Neisseria meningitidis NOT DETECTED NOT DETECTED Final   Pseudomonas aeruginosa NOT DETECTED NOT DETECTED Final   Candida albicans NOT DETECTED NOT DETECTED Final   Candida glabrata NOT DETECTED NOT DETECTED Final   Candida krusei NOT DETECTED NOT DETECTED Final   Candida parapsilosis NOT DETECTED NOT DETECTED Final   Candida tropicalis NOT DETECTED NOT DETECTED Final    Comment: Performed at [facility], [address], [place], [place] [address]  Culture, blood (routine x 2)     Status: Abnormal   Collection Time: 05/30/19  9:55 AM   Specimen: BLOOD  Result Value Ref Range Status   Specimen Description BLOOD LEFT ANTECUBITAL  Final   Special Requests   Final    BOTTLES DRAWN AEROBIC ONLY Blood Culture results may not be optimal due to an inadequate volume of blood received in culture bottles   Culture  Setup Time   Final    AEROBIC BOTTLE ONLY GRAM POSITIVE COCCI IN  CLUSTERS CRITICAL VALUE NOTED.  VALUE IS CONSISTENT WITH PREVIOUSLY REPORTED AND CALLED VALUE.    Culture (A)  Final    STAPHYLOCOCCUS AUREUS SUSCEPTIBILITIES PERFORMED ON PREVIOUS CULTURE WITHIN THE LAST 5 DAYS. Performed at [facility], [address], [place], [place] [address]    Report Status 06/01/2019 FINAL  Final  Culture, blood (routine x 2)     Status: None   Collection Time: 05/31/19  7:12 AM   Specimen: BLOOD RIGHT HAND  Result Value Ref Range Status   Specimen Description BLOOD RIGHT HAND  Final   Special Requests   Final    BOTTLES DRAWN AEROBIC ONLY Blood Culture results may not be optimal due to an inadequate volume of blood received in culture bottles   Culture   Final    NO GROWTH 5 DAYS Performed at [facility], [address], [place], [place] [address]    Report Status 06/05/2019 FINAL  Final  Culture, blood (routine x 2)  Status: None   Collection Time: 05/31/19  7:45 AM   Specimen: BLOOD LEFT ARM  Result Value Ref Range Status   Specimen Description BLOOD LEFT ARM  Final   Special Requests   Final    BOTTLES DRAWN AEROBIC ONLY Blood Culture results may not be optimal due to an inadequate volume of blood received in culture bottles   Culture   Final    NO GROWTH 5 DAYS Performed at [facility], [address] [place], [place] [address]    Report Status 06/05/2019 FINAL  Final  Culture, respiratory (non-expectorated)     Status: None   Collection Time: 06/02/19  9:17 AM   Specimen: Tracheal Aspirate; Respiratory  Result Value Ref Range Status   Specimen Description TRACHEAL ASPIRATE  Final   Special Requests Normal  Final   Gram Stain   Final    NO WBC SEEN ABUNDANT GRAM NEGATIVE RODS RARE YEAST Performed at [facility], [address] [place], [place] [address]    Culture ABUNDANT ESCHERICHIA COLI  Final   Report Status 06/04/2019 FINAL  Final   Organism ID, Bacteria ESCHERICHIA COLI  Final      Susceptibility    Escherichia coli - MIC*    AMPICILLIN >=32 RESISTANT Resistant     CEFAZOLIN 8 SENSITIVE Sensitive     CEFEPIME <=1 SENSITIVE Sensitive     CEFTAZIDIME <=1 SENSITIVE Sensitive     CEFTRIAXONE <=1 SENSITIVE Sensitive     CIPROFLOXACIN 1 SENSITIVE Sensitive     GENTAMICIN <=1 SENSITIVE Sensitive     IMIPENEM <=0.25 SENSITIVE Sensitive     TRIMETH/SULFA <=20 SENSITIVE Sensitive     AMPICILLIN/SULBACTAM >=32 RESISTANT Resistant     PIP/TAZO 64 INTERMEDIATE Intermediate     * ABUNDANT ESCHERICHIA COLI  Culture, Urine     Status: None   Collection Time: 06/12/19  3:50 PM   Specimen: Urine, Catheterized  Result Value Ref Range Status   Specimen Description URINE, CATHETERIZED  Final   Special Requests Normal  Final   Culture   Final    NO GROWTH Performed at [facility], [address] [place], [place] [address]    Report Status 06/13/2019 FINAL  Final  Culture, blood (Routine X 2) w Reflex to ID Panel     Status: None (Preliminary result)   Collection Time: 06/12/19  4:22 PM   Specimen: BLOOD RIGHT HAND  Result Value Ref Range Status   Specimen Description BLOOD RIGHT HAND  Final   Special Requests   Final    BOTTLES DRAWN AEROBIC AND ANAEROBIC Blood Culture adequate volume   Culture   Final    NO GROWTH 2 DAYS Performed at [facility], [address], [place], [place] [address]    Report Status PENDING  Incomplete  Culture, respiratory (non-expectorated)     Status: None (Preliminary result)   Collection Time: 06/12/19  8:34 PM   Specimen: Tracheal Aspirate; Respiratory  Result Value Ref Range Status   Specimen Description TRACHEAL ASPIRATE  Final   Special Requests NONE  Final   Gram Stain   Final    RARE WBC PRESENT,BOTH PMN AND MONONUCLEAR MODERATE GRAM NEGATIVE RODS RARE GRAM POSITIVE COCCI IN PAIRS    Culture   Final    MODERATE ENTEROBACTER AEROGENES CULTURE REINCUBATED FOR BETTER GROWTH SUSCEPTIBILITIES TO FOLLOW Performed at [facility]  [facility], [address] [place], [place] [address]    Report Status PENDING  Incomplete  C Difficile Quick Screen w PCR reflex     Status: None   Collection Time: 06/12/19 10:41 PM   Specimen: STOOL  Result Value Ref Range Status   C Diff antigen NEGATIVE NEGATIVE Final   C Diff toxin NEGATIVE NEGATIVE Final   C Diff interpretation No C. difficile detected.  Final    Comment: Performed at [facility], [address], [place], [place] [address]    Anti-infectives:  Anti-infectives (From admission, onward)   Start     Dose/Rate Route Frequency Ordered Stop   06/13/19 1900  vancomycin (VANCOCIN) 1,000 mg in sodium chloride 0.9 % 100 mL IVPB     1,000 mg 100 mL/hr over 60 Minutes Intravenous Every 24 hours 06/13/19 0947     06/13/19 1830  vancomycin (VANCOCIN) IVPB 1000 mg/200 mL premix  Status:  Discontinued     1,000 mg 200 mL/hr over 60 Minutes Intravenous Every 24 hours 06/12/19 1704 06/13/19 0947   06/13/19 0900  metroNIDAZOLE (FLAGYL) IVPB 500 mg     500 mg 100 mL/hr over 60 Minutes Intravenous Every 8 hours 06/13/19 0853     06/12/19 1800  vancomycin (VANCOREADY) IVPB 2000 mg/400 mL     2,000 mg 200 mL/hr over 120 Minutes Intravenous  Once 06/12/19 1704 06/12/19 1937   06/12/19 1200  piperacillin-tazobactam (ZOSYN) IVPB 3.375 g  Status:  Discontinued     3.375 g 100 mL/hr over 30 Minutes Intravenous Every 6 hours 06/12/19 0846 06/12/19 0846   06/12/19 1000  ceFEPIme (MAXIPIME) 2 g in sodium chloride 0.9 % 100 mL IVPB     2 g 200 mL/hr over 30 Minutes Intravenous Every 12 hours 06/12/19 0912     06/12/19 0900  piperacillin-tazobactam (ZOSYN) IVPB 3.375 g  Status:  Discontinued     3.375 g 100 mL/hr over 30 Minutes Intravenous Every 6 hours 06/12/19 0846 06/12/19 0912   06/10/19 1400  nafcillin 12 g in sodium chloride 0.9 % 500 mL continuous infusion  Status:  Discontinued     12 g 20.8 mL/hr over 24 Hours Intravenous Every 24 hours 06/09/19 1533 06/09/19 1608   06/10/19 0200   nafcillin 12 g in sodium chloride 0.9 % 500 mL continuous infusion  Status:  Discontinued     12 g 20.8 mL/hr over 24 Hours Intravenous Every 24 hours 06/09/19 1608 06/12/19 0805   06/09/19 2124  ceFAZolin (ANCEF) IVPB 1 g/50 mL premix  Status:  Discontinued     1 g 100 mL/hr over 30 Minutes Intravenous Every 24 hours 06/09/19 0746 06/09/19 1301   06/09/19 1400  ceFAZolin (ANCEF) IVPB 2g/100 mL premix  Status:  Discontinued     2 g 200 mL/hr over 30 Minutes Intravenous Every 12 hours 06/09/19 1301 06/09/19 1533   06/05/19 2030  ceFAZolin (ANCEF) IVPB 2g/100 mL premix  Status:  Discontinued     2 g 200 mL/hr over 30 Minutes Intravenous Every 12 hours 06/05/19 0905 06/09/19 0746   06/03/19 2300  ceFEPIme (MAXIPIME) 2 g in sodium chloride 0.9 % 100 mL IVPB  Status:  Discontinued     2 g 200 mL/hr over 30 Minutes Intravenous Every 24 hours 06/03/19 0641 06/03/19 1831   06/03/19 2000  ceFEPIme (MAXIPIME) 2 g in sodium chloride 0.9 % 100 mL IVPB  Status:  Discontinued     2 g 200 mL/hr over 30 Minutes Intravenous Every 12 hours 06/03/19 1830 06/05/19 0905   06/02/19 1000  clindamycin (CLEOCIN) IVPB  600 mg  Status:  Discontinued     600 mg 100 mL/hr over 30 Minutes Intravenous Every 8 hours 06/02/19 0142 06/02/19 0812   06/02/19 0830  ceFEPIme (MAXIPIME) 2 g in sodium chloride 0.9 % 100 mL IVPB  Status:  Discontinued     2 g 200 mL/hr over 30 Minutes Intravenous Every 12 hours 06/02/19 0812 06/03/19 0641   06/02/19 0815  metroNIDAZOLE (FLAGYL) IVPB 500 mg  Status:  Discontinued     500 mg 100 mL/hr over 60 Minutes Intravenous Every 8 hours 06/02/19 0812 06/05/19 0905   06/02/19 0200  clindamycin (CLEOCIN) IVPB 600 mg     600 mg 100 mL/hr over 30 Minutes Intravenous  Once 06/02/19 0142 06/02/19 0254   05/31/19 1330  nafcillin 12 g in sodium chloride 0.9 % 500 mL continuous infusion  Status:  Discontinued     12 g 20.8 mL/hr over 24 Hours Intravenous Every 24 hours 05/31/19 1052 06/02/19 0812    05/31/19 0200  nafcillin 2 g in sodium chloride 0.9 % 100 mL IVPB  Status:  Discontinued     2 g 200 mL/hr over 30 Minutes Intravenous Every 4 hours 05/31/19 0055 05/31/19 1052   05/31/19 0115  nafcillin injection 2 g  Status:  Discontinued     2 g Intravenous Every 4 hours 05/31/19 0027 05/31/19 0054      Best Practice/Protocols:  VTE Prophylaxis: Heparin (SQ) GI Prophylaxis: Proton Pump Inhibitor Continous Sedation  Consults: Treatment Team:  Nicholes Stairs, MD Claudia Desanctis, MD    Studies:    Events:  Subjective:    Overnight Issues:   Objective:  Vital signs for last 24 hours: Temp:  [95.9 F (35.5 C)-98.8 F (37.1 C)] 95.9 F (35.5 C) (05/11 0700) Pulse Rate:  [59-104] 59 (05/11 0700) Resp:  [23-41] 35 (05/11 0700) BP: (95-148)/(46-89) 121/54 (05/11 0700) SpO2:  [89 %-100 %] 100 % (05/11 0700) Arterial Line BP: (104-160)/(50-68) 128/55 (05/11 0615) FiO2 (%):  [40 %-60 %] 40 % (05/11 0400) Weight:  [112.4 kg] 112.4 kg (05/11 0500)  Hemodynamic parameters for last 24 hours:    Intake/Output from previous day: 05/10 0701 - 05/11 0700 In: 3912.3 [I.V.:2983.1; Blood:315; IV Piggyback:614.1] Out: 1217 [Emesis/NG output:850; Stool:375]  Intake/Output this shift: No intake/output data recorded.  Vent settings for last 24 hours: Vent Mode: PRVC FiO2 (%):  [40 %-60 %] 40 % Set Rate:  [30 bmp-35 bmp] 35 bmp Vt Set:  [500 mL] 500 mL PEEP:  [8 cmH20-12 cmH20] 8 cmH20 Plateau Pressure:  [14 cmH20-23 cmH20] 20 cmH20  Physical Exam:  General: on vent Neuro: opens eyes and looks to voice, not F/C HEENT/Neck: ETT Resp: clear to auscultation bilaterally CVS: RRR GI: soft, NT, a few BS Extremities: edema 2+  Results for orders placed or performed during the hospital encounter of 05/09/2019 (from the past 24 hour(s))  Glucose, capillary     Status: Abnormal   Collection Time: 06/13/19  7:51 AM  Result Value Ref Range   Glucose-Capillary 121 (H) 70 -  99 mg/dL  Prepare RBC (crossmatch)     Status: None   Collection Time: 06/13/19  9:00 AM  Result Value Ref Range   Order Confirmation      ORDER PROCESSED BY BLOOD BANK Performed at [facility], [address], [place], [place] [address]   Type and screen [place]     Status: None (Preliminary result)   Collection Time: 06/13/19  9:00 AM  Result Value Ref Range   ABO/RH(D) A POS    Antibody Screen NEG    Sample Expiration 06/16/2019,2359    Unit Number [id number]    Blood Component Type RED CELLS,LR    Unit division 00    Status of Unit ISSUED    Transfusion Status OK TO TRANSFUSE    Crossmatch Result      Compatible Performed at [facility], [address] [place], [place] [address]   ABO/Rh     Status: None   Collection Time: 06/13/19  9:00 AM  Result Value Ref Range   ABO/RH(D)      A POS Performed at [facility], [address] [place], [place] [address]   Prealbumin     Status: Abnormal   Collection Time: 06/13/19  9:02 AM  Result Value Ref Range   Prealbumin 16.3 (L) 18 - 38 mg/dL  I-STAT 7, (LYTES, BLD GAS, ICA, H+H)     Status: Abnormal   Collection Time: 06/13/19  9:15 AM  Result Value Ref Range   pH, Arterial 7.382 7.350 - 7.450   pCO2 arterial 48.2 (H) 32.0 - 48.0 mmHg   pO2, Arterial 200 (H) 83.0 - 108.0 mmHg   Bicarbonate 28.7 (H) 20.0 - 28.0 mmol/L   TCO2 30 22 - 32 mmol/L   O2 Saturation 100.0 %   Acid-Base Excess 3.0 (H) 0.0 - 2.0 mmol/L   Sodium 136 135 - 145 mmol/L   Potassium 4.2 3.5 - 5.1 mmol/L   Calcium, Ion 1.11 (L) 1.15 - 1.40 mmol/L   HCT 24.0 (L) 39.0 - 52.0 %   Hemoglobin 8.2 (L) 13.0 - 17.0 g/dL   Patient temperature 16.3 F    Collection site Web designer by Operator    Sample type ARTERIAL   Occult blood card to lab, stool     Status: Abnormal   Collection Time: 06/13/19 11:05 AM  Result Value Ref Range   Fecal Occult Bld POSITIVE (A) NEGATIVE  Glucose, capillary     Status: None    Collection Time: 06/13/19 11:11 AM  Result Value Ref Range   Glucose-Capillary 99 70 - 99 mg/dL  Glucose, capillary     Status: Abnormal   Collection Time: 06/13/19  3:14 PM  Result Value Ref Range   Glucose-Capillary 103 (H) 70 - 99 mg/dL   Comment 1 Notify RN    Comment 2 Document in Chart   Renal function panel (daily at 1600)     Status: Abnormal   Collection Time: 06/13/19  4:52 PM  Result Value Ref Range   Sodium 137 135 - 145 mmol/L   Potassium 5.1 3.5 - 5.1 mmol/L   Chloride 100 98 - 111 mmol/L   CO2 23 22 - 32 mmol/L   Glucose, Bld 117 (H) 70 - 99 mg/dL   BUN 74 (H) 8 - 23 mg/dL   Creatinine, Ser 8.46 (H) 0.61 - 1.24 mg/dL   Calcium 8.1 (L) 8.9 - 10.3 mg/dL   Phosphorus 5.8 (H) 2.5 - 4.6 mg/dL   Albumin 2.0 (L) 3.5 - 5.0 g/dL   GFR calc non Af Amer 30 (L) >60 mL/min   GFR calc Af Amer 34 (L) >60 mL/min   Anion gap 14 5 - 15  Glucose, capillary     Status: None   Collection Time: 06/13/19  7:52 PM  Result Value Ref Range   Glucose-Capillary 94 70 - 99 mg/dL  Glucose, capillary     Status: None   Collection Time: 06/13/19 11:25 PM  Result Value Ref Range   Glucose-Capillary 91 70 - 99 mg/dL  Glucose, capillary     Status: Abnormal   Collection Time: 06/14/19  3:29 AM  Result Value Ref Range   Glucose-Capillary 109 (H) 70 - 99 mg/dL  Magnesium     Status: Abnormal   Collection Time: 06/14/19  4:37 AM  Result Value Ref Range   Magnesium 2.6 (H) 1.7 - 2.4 mg/dL  CBC     Status: Abnormal   Collection Time: 06/14/19  4:37 AM  Result Value Ref Range   WBC 18.1 (H) 4.0 - 10.5 K/uL   RBC 2.51 (L) 4.22 - 5.81 MIL/uL   Hemoglobin 7.7 (L) 13.0 - 17.0 g/dL   HCT 14.9 (L) 70.2 - 63.7 %   MCV 99.6 80.0 - 100.0 fL   MCH 30.7 26.0 - 34.0 pg   MCHC 30.8 30.0 - 36.0 g/dL   RDW 85.8 (H) 85.0 - 27.7 %   Platelets 245 150 - 400 K/uL   nRBC 0.9 (H) 0.0 - 0.2 %  Renal function panel     Status: Abnormal   Collection Time: 06/14/19  4:37 AM  Result Value Ref Range   Sodium  135 135 - 145 mmol/L   Potassium 4.4 3.5 - 5.1 mmol/L   Chloride 100 98 - 111 mmol/L   CO2 22 22 - 32 mmol/L   Glucose, Bld 130 (H) 70 - 99 mg/dL   BUN 59 (H) 8 - 23 mg/dL   Creatinine, Ser 4.12 (H) 0.61 - 1.24 mg/dL   Calcium 7.9 (L) 8.9 - 10.3 mg/dL   Phosphorus 5.3 (H) 2.5 - 4.6 mg/dL   Albumin 1.7 (L) 3.5 - 5.0 g/dL   GFR calc non Af Amer 35 (L) >60 mL/min   GFR calc Af Amer 40 (L) >60 mL/min   Anion gap 13 5 - 15    Assessment & Plan: Present on Admission: . Closed displaced fracture of body of left scapula . Traumatic brain injury with loss of consciousness (HCC) . OSA (obstructive sleep apnea) . RLS (restless legs syndrome) . Essential hypertension . Acute blood loss anemia . Multiple trauma . Tachycardia . Multiple closed fractures of ribs of left side . Abdominal distention . Closed left scapular fracture    LOS: 21 days   Additional comments:I reviewed the patient's new clinical lab test results. and CT [facility]  Acute hypoxic respiratory failurewith ARDS- previously resolved, new insult 5/9 with increased vent req'ts, on 60% and 12, but could wean. Maintain 6cc/kg, permissive hypercapnia as long as pH is above 7.2, abg now and see if can wean PEEP to 5 SAH, concussion- NSGY c/s (Dr. Russella Dar head4/21, Keppra x7d for sz ppx,MR 5/6 with shear injury, expected.  Comminuted left scapula fx- slingfor now, orthoc/s (Dr. 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 As above. Malnutrition - olding TNA as below Hyperbilirubinemia- likely reabsorption of scattered hematomas.RUQ u/s as above.  AKI - CRRT per Renal, keep even FEN -hold TNA, hold TF Hyperglycemia- lantus held overnight off TNA and TF. On D10. D/C lantus and monitor  VTE - SCDs,SQH (AKI) Dispo - ICU. Per his wife's discussion with Dr. Bedelia Person yesterday, patient is a DNR. Plan to see if he improves over the next 24-48h before deciding on further GOC changes. Critical Care Total Time*: 45 Minutes  Violeta Gelinas, MD, MPH, FACS Trauma & General Surgery Use [email or web address] to contact on call provider  06/14/2019  *Care during the described time interval was provided by me. I have reviewed this patient's available data, including medical history, events of note, physical examination and test results as part of my evaluation.

## 2019-06-15 ENCOUNTER — Inpatient Hospital Stay (HOSPITAL_COMMUNITY): Payer: PRIVATE HEALTH INSURANCE

## 2019-06-15 DIAGNOSIS — R0603 Acute respiratory distress: Secondary | ICD-10-CM

## 2019-06-15 LAB — COMPREHENSIVE METABOLIC PANEL
ALT: 246 U/L — ABNORMAL HIGH (ref 0–44)
AST: 450 U/L — ABNORMAL HIGH (ref 15–41)
Albumin: 1.6 g/dL — ABNORMAL LOW (ref 3.5–5.0)
Alkaline Phosphatase: 152 U/L — ABNORMAL HIGH (ref 38–126)
Anion gap: 11 (ref 5–15)
BUN: 45 mg/dL — ABNORMAL HIGH (ref 8–23)
CO2: 23 mmol/L (ref 22–32)
Calcium: 8.1 mg/dL — ABNORMAL LOW (ref 8.9–10.3)
Chloride: 101 mmol/L (ref 98–111)
Creatinine, Ser: 2.06 mg/dL — ABNORMAL HIGH (ref 0.61–1.24)
GFR calc Af Amer: 38 mL/min — ABNORMAL LOW (ref >60)
GFR calc non Af Amer: 33 mL/min — ABNORMAL LOW (ref >60)
Glucose, Bld: 130 mg/dL — ABNORMAL HIGH (ref 70–99)
Potassium: 4.6 mmol/L (ref 3.5–5.1)
Sodium: 135 mmol/L (ref 135–145)
Total Bilirubin: 18.8 mg/dL (ref 0.3–1.2)
Total Protein: 5.6 g/dL — ABNORMAL LOW (ref 6.5–8.1)

## 2019-06-15 LAB — RENAL FUNCTION PANEL
Albumin: 1.5 g/dL — ABNORMAL LOW (ref 3.5–5.0)
Anion gap: 12 (ref 5–15)
BUN: 42 mg/dL — ABNORMAL HIGH (ref 8–23)
CO2: 22 mmol/L (ref 22–32)
Calcium: 8 mg/dL — ABNORMAL LOW (ref 8.9–10.3)
Chloride: 101 mmol/L (ref 98–111)
Creatinine, Ser: 2.04 mg/dL — ABNORMAL HIGH (ref 0.61–1.24)
GFR calc Af Amer: 39 mL/min — ABNORMAL LOW (ref >60)
GFR calc non Af Amer: 33 mL/min — ABNORMAL LOW (ref >60)
Glucose, Bld: 115 mg/dL — ABNORMAL HIGH (ref 70–99)
Phosphorus: 4.3 mg/dL (ref 2.5–4.6)
Potassium: 4.4 mmol/L (ref 3.5–5.1)
Sodium: 135 mmol/L (ref 135–145)

## 2019-06-15 LAB — POCT I-STAT 7, (LYTES, BLD GAS, ICA,H+H)
Acid-Base Excess: 2 mmol/L (ref 0.0–2.0)
Bicarbonate: 26.7 mmol/L (ref 20.0–28.0)
Calcium, Ion: 1.19 mmol/L (ref 1.15–1.40)
HCT: 23 % — ABNORMAL LOW (ref 39.0–52.0)
Hemoglobin: 7.8 g/dL — ABNORMAL LOW (ref 13.0–17.0)
O2 Saturation: 99 %
Patient temperature: 37.2
Potassium: 4.5 mmol/L (ref 3.5–5.1)
Sodium: 134 mmol/L — ABNORMAL LOW (ref 135–145)
TCO2: 28 mmol/L (ref 22–32)
pCO2 arterial: 44.1 mmHg (ref 32.0–48.0)
pH, Arterial: 7.39 (ref 7.350–7.450)
pO2, Arterial: 134 mmHg — ABNORMAL HIGH (ref 83.0–108.0)

## 2019-06-15 LAB — GLUCOSE, CAPILLARY
Glucose-Capillary: 115 mg/dL — ABNORMAL HIGH (ref 70–99)
Glucose-Capillary: 117 mg/dL — ABNORMAL HIGH (ref 70–99)
Glucose-Capillary: 125 mg/dL — ABNORMAL HIGH (ref 70–99)
Glucose-Capillary: 91 mg/dL (ref 70–99)
Glucose-Capillary: 97 mg/dL (ref 70–99)
Glucose-Capillary: 97 mg/dL (ref 70–99)

## 2019-06-15 LAB — CBC
HCT: 25.3 % — ABNORMAL LOW (ref 39.0–52.0)
Hemoglobin: 7.9 g/dL — ABNORMAL LOW (ref 13.0–17.0)
MCH: 31.5 pg (ref 26.0–34.0)
MCHC: 31.2 g/dL (ref 30.0–36.0)
MCV: 100.8 fL — ABNORMAL HIGH (ref 80.0–100.0)
Platelets: 247 10*3/uL (ref 150–400)
RBC: 2.51 MIL/uL — ABNORMAL LOW (ref 4.22–5.81)
RDW: 25.6 % — ABNORMAL HIGH (ref 11.5–15.5)
WBC: 16.5 10*3/uL — ABNORMAL HIGH (ref 4.0–10.5)
nRBC: 1 % — ABNORMAL HIGH (ref 0.0–0.2)

## 2019-06-15 LAB — CULTURE, RESPIRATORY W GRAM STAIN

## 2019-06-15 LAB — AMMONIA: Ammonia: 45 umol/L — ABNORMAL HIGH (ref 9–35)

## 2019-06-15 LAB — MAGNESIUM: Magnesium: 2.8 mg/dL — ABNORMAL HIGH (ref 1.7–2.4)

## 2019-06-15 LAB — PHOSPHORUS: Phosphorus: 4.2 mg/dL (ref 2.5–4.6)

## 2019-06-15 MED ORDER — LACTULOSE ENEMA
300.0000 mL | Freq: Once | ORAL | Status: DC
  Filled 2019-06-15: qty 300

## 2019-06-15 MED ORDER — ZINC CHLORIDE 1 MG/ML IV SOLN
INTRAVENOUS | Status: AC
  Filled 2019-06-15: qty 627.2

## 2019-06-15 NOTE — Progress Notes (Addendum)
Trauma/Critical Care Follow Up Note  Subjective:    Overnight Issues:   Objective:  Vital signs for last 24 hours: Temp:  [97.3 F (36.3 C)-99.3 F (37.4 C)] 99.3 F (37.4 C) (05/12 1200) Pulse Rate:  [62-100] 88 (05/12 1200) Resp:  [16-37] 35 (05/12 1200) BP: (86-139)/(34-68) 130/48 (05/12 1200) SpO2:  [88 %-100 %] 96 % (05/12 1200) Arterial Line BP: (82-155)/(43-71) 149/67 (05/12 1200) FiO2 (%):  [40 %] 40 % (05/12 1200) Weight:  [113.9 kg] 113.9 kg (05/12 0500)  Hemodynamic parameters for last 24 hours:    Intake/Output from previous day: 05/11 0701 - 05/12 0700 In: 4017 [I.V.:2536.9; NG/GT:1280; IV Piggyback:200] Out: 897 [Emesis/NG output:450; Stool:375]  Intake/Output this shift: Total I/O In: 594.4 [I.V.:494.4; IV Piggyback:100] Out: 1055 [Other:1055]  Vent settings for last 24 hours: Vent Mode: PRVC FiO2 (%):  [40 %] 40 % Set Rate:  [35 bmp] 35 bmp Vt Set:  [500 mL] 500 mL PEEP:  [5 cmH20-8 cmH20] 5 cmH20 Pressure Support:  [10 cmH20] 10 cmH20 Plateau Pressure:  [20 cmH20-25 cmH20] 25 cmH20  Physical Exam:  Gen: comfortable, no distress Neuro: opens eyes, does not follow commands HEENT: intubated Neck: supple CV: RRR Pulm: unlabored breathing, mechanically ventilated Abd: soft, nontender GU: RRT Extr: wwp, 3+ edema   Results for orders placed or performed during the hospital encounter of 05/11/2019 (from the past 24 hour(s))  Glucose, capillary     Status: Abnormal   Collection Time: 06/14/19  3:26 PM  Result Value Ref Range   Glucose-Capillary 110 (H) 70 - 99 mg/dL   Comment 1 Notify RN    Comment 2 Document in Chart   Renal function panel (daily at 1600)     Status: Abnormal   Collection Time: 06/14/19  4:23 PM  Result Value Ref Range   Sodium 137 135 - 145 mmol/L   Potassium 4.6 3.5 - 5.1 mmol/L   Chloride 102 98 - 111 mmol/L   CO2 22 22 - 32 mmol/L   Glucose, Bld 123 (H) 70 - 99 mg/dL   BUN 52 (H) 8 - 23 mg/dL   Creatinine, Ser 1.91  (H) 0.61 - 1.24 mg/dL   Calcium 8.1 (L) 8.9 - 10.3 mg/dL   Phosphorus 4.7 (H) 2.5 - 4.6 mg/dL   Albumin 1.5 (L) 3.5 - 5.0 g/dL   GFR calc non Af Amer 36 (L) >60 mL/min   GFR calc Af Amer 42 (L) >60 mL/min   Anion gap 13 5 - 15  Glucose, capillary     Status: Abnormal   Collection Time: 06/14/19  8:06 PM  Result Value Ref Range   Glucose-Capillary 110 (H) 70 - 99 mg/dL  Glucose, capillary     Status: Abnormal   Collection Time: 06/14/19 11:26 PM  Result Value Ref Range   Glucose-Capillary 114 (H) 70 - 99 mg/dL  Glucose, capillary     Status: Abnormal   Collection Time: 06/15/19  3:34 AM  Result Value Ref Range   Glucose-Capillary 117 (H) 70 - 99 mg/dL  I-STAT 7, (LYTES, BLD GAS, ICA, H+H)     Status: Abnormal   Collection Time: 06/15/19  4:58 AM  Result Value Ref Range   pH, Arterial 7.390 7.350 - 7.450   pCO2 arterial 44.1 32.0 - 48.0 mmHg   pO2, Arterial 134 (H) 83.0 - 108.0 mmHg   Bicarbonate 26.7 20.0 - 28.0 mmol/L   TCO2 28 22 - 32 mmol/L   O2 Saturation 99.0 %  Acid-Base Excess 2.0 0.0 - 2.0 mmol/L   Sodium 134 (L) 135 - 145 mmol/L   Potassium 4.5 3.5 - 5.1 mmol/L   Calcium, Ion 1.19 1.15 - 1.40 mmol/L   HCT 23.0 (L) 39.0 - 52.0 %   Hemoglobin 7.8 (L) 13.0 - 17.0 g/dL   Patient temperature 29.5 C    Collection site art line    Drawn by Nurse    Sample type ARTERIAL   Magnesium     Status: Abnormal   Collection Time: 06/15/19  4:59 AM  Result Value Ref Range   Magnesium 2.8 (H) 1.7 - 2.4 mg/dL  CBC     Status: Abnormal   Collection Time: 06/15/19  4:59 AM  Result Value Ref Range   WBC 16.5 (H) 4.0 - 10.5 K/uL   RBC 2.51 (L) 4.22 - 5.81 MIL/uL   Hemoglobin 7.9 (L) 13.0 - 17.0 g/dL   HCT 28.4 (L) 13.2 - 44.0 %   MCV 100.8 (H) 80.0 - 100.0 fL   MCH 31.5 26.0 - 34.0 pg   MCHC 31.2 30.0 - 36.0 g/dL   RDW 10.2 (H) 72.5 - 36.6 %   Platelets 247 150 - 400 K/uL   nRBC 1.0 (H) 0.0 - 0.2 %  Comprehensive metabolic panel     Status: Abnormal   Collection Time:  06/15/19  4:59 AM  Result Value Ref Range   Sodium 135 135 - 145 mmol/L   Potassium 4.6 3.5 - 5.1 mmol/L   Chloride 101 98 - 111 mmol/L   CO2 23 22 - 32 mmol/L   Glucose, Bld 130 (H) 70 - 99 mg/dL   BUN 45 (H) 8 - 23 mg/dL   Creatinine, Ser 4.40 (H) 0.61 - 1.24 mg/dL   Calcium 8.1 (L) 8.9 - 10.3 mg/dL   Total Protein 5.6 (L) 6.5 - 8.1 g/dL   Albumin 1.6 (L) 3.5 - 5.0 g/dL   AST 347 (H) 15 - 41 U/L   ALT 246 (H) 0 - 44 U/L   Alkaline Phosphatase 152 (H) 38 - 126 U/L   Total Bilirubin 18.8 (HH) 0.3 - 1.2 mg/dL   GFR calc non Af Amer 33 (L) >60 mL/min   GFR calc Af Amer 38 (L) >60 mL/min   Anion gap 11 5 - 15  Phosphorus     Status: None   Collection Time: 06/15/19  4:59 AM  Result Value Ref Range   Phosphorus 4.2 2.5 - 4.6 mg/dL  Glucose, capillary     Status: Abnormal   Collection Time: 06/15/19  7:42 AM  Result Value Ref Range   Glucose-Capillary 125 (H) 70 - 99 mg/dL   Comment 1 Notify RN    Comment 2 Document in Chart   Ammonia     Status: Abnormal   Collection Time: 06/15/19 10:06 AM  Result Value Ref Range   Ammonia 45 (H) 9 - 35 umol/L  Glucose, capillary     Status: None   Collection Time: 06/15/19 11:19 AM  Result Value Ref Range   Glucose-Capillary 91 70 - 99 mg/dL   Comment 1 Notify RN    Comment 2 Document in Chart     Assessment & Plan: The plan of care was discussed with the bedside nurse for the day, Lauren, who is in agreement with this plan and no additional concerns were raised.   Present on Admission: . Closed displaced fracture of body of left scapula . Traumatic brain injury with loss of consciousness (HCC) .  OSA (obstructive sleep apnea) . RLS (restless legs syndrome) . Essential hypertension . Acute blood loss anemia . Multiple trauma . Tachycardia . Multiple closed fractures of ribs of left side . Abdominal distention . Closed left scapular fracture    LOS: 22 days   Additional comments:I reviewed the patient's new clinical lab test  results.   and I reviewed the patients new imaging test results.    [facility]   Acute hypoxic respiratory failure with ARDS - previously resolved, new insult 5/9 with increased vent req'ts, on 40% and 5, weaned this AM 10/5 for a couple of hours.  SAH, concussion - NSGY c/s (Dr. Maurice Small), stable CT head 4/21, Keppra x7d for sz ppx, MR 5/6 with shear injury, expected.  Comminuted left scapula fx- sling for now, ortho c/s (Dr. 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 As above. Malnutrition - restart TPN today Hyperbilirubinemia - likely reabsorption of scattered hematomas. RUQ u/s as above.  AKI - CRRT per Renal, UF as much as possible, no more than 10 of levophed FEN - hold TF, restart TPN, transaminitis improved Hyperglycemia - lantus held, off TNA and TF. Restart lantus with re-initiation of TPN  VTE - SCDs, SQH (AKI) Dispo - ICU  Family discussion with wife and son today. Son had an opportunity to ask questions and had several written in a notebook. Very invested, also very aware of the patient's wishes not to "live like this". Family has decided to continue current care, continue DNR status, and transition to comfort care 5/15.   Critical Care Total Time: 95 minutes  Diamantina Monks, MD Trauma &  General Surgery Please use [email or web address] to contact on call provider  06/15/2019  *Care during the described time interval was provided by me. I have reviewed this patient's available data, including medical history, events of note, physical examination and test results as part of my evaluation.

## 2019-06-15 NOTE — Progress Notes (Signed)
Pharmacy Antibiotic Note  Ethan Clayburn is a 64 y.o. male admitted on 05/25/2019 with pneumonia.  Pharmacy has been consulted for cefepime dosing. Patient is on CRRT.  TA showing enterobacter aerogenes and E. Coli. Both organisms susceptible to cefepime. WBC is improving from 18.1 >> 16.5. He has been afebrile, vitals improving. He is tolerating CRRT with no stoppages noted.  Plan: Continue cefepime 2g q12h Monitor WBC, temp, clinical status, CRRT tolerance   Height: '6\' 3"'$  (190.5 cm) Weight: 113.9 kg (251 lb 1.7 oz) IBW/kg (Calculated) : 84.5  Temp (24hrs), Avg:98.3 F (36.8 C), Min:96.6 F (35.9 C), Max:99.3 F (37.4 C)  Recent Labs  Lab 06/11/19 2040 06/11/19 2040 06/12/19 0519 06/12/19 1252 06/12/19 1645 06/12/19 2241 06/13/19 0506 06/13/19 1652 06/14/19 0437 06/14/19 1623 06/15/19 0459  WBC 20.1*  --  25.7*  --   --   --  24.6*  --  18.1*  --  16.5*  CREATININE 2.70*   < > 2.43*   < >  --   --  2.16* 2.26* 1.97* 1.91* 2.06*  LATICACIDVEN  --   --   --   --  8.7* 5.0* 3.2*  --   --   --   --    < > = values in this interval not displayed.    Estimated Creatinine Clearance: 49.3 mL/min (A) (by C-G formula based on SCr of 2.06 mg/dL (H)).    No Known Allergies  Antimicrobials this admission: Nafcillin 4/27 >> 4/29, 5/6>5/9 - was supposed to end 5/18 Cefepime  5/9>> Cefepime 4/29 >>5/2 Met 4/29 >>5/2 Cefazolin 5/2>>5/6 Vancomycin 5/9 >>5/11 Flagyl 5/10 >>5/11  MRSA PCR - neg 4/26 BCx - MSSA 4/27 BCx - negative 4/29 TA: E.coli ( R Unasyn, I Zosyn) 5/9: Bcx < 24 hrs 5/9 TA: Enterobacter aerogenes (S cefepime), E. Coli (S cefepime) 5/9 Cdiff- negative  Thank you for allowing pharmacy to be a part of this patient's care.  Sherren Kerns, PharmD PGY1 Acute Care Pharmacy Resident [phone] Please check AMION for all [facility] numbers 06/15/2019

## 2019-06-15 NOTE — Consult Note (Signed)
WOC Nurse Consult Note: Patient receiving care in [facility] 4N26.  Assisted with turning by primary RN. Reason for Consult: sacral DTI, lip pressure injury from ETT Wound type: DTPIs to the sacrum/coccyx, bottom lip, occiput.  All are maroon in color with intact overlying tissue. Total body skin is jaundiced. Pressure Injury POA: No Measurement: occiput measures 3.2 cm x 2 cm; bottom lip is less than 1 cm x 1 cm; sacral/coccyx/buttocks wound measures 11.2 cm x 7 cm Wound bed: all maroon Drainage (amount, consistency, odor) none Periwound: jaundiced Dressing procedure/placement/frequency: iodine to the occiput twice daily with head on a pillow. Use a sacral foam dressing over the maroon colored area on the sacrum/coccyx/buttocks. Lift each shift and view tissue for development of slough. Change foam every 3 days and prn. Vaseline gauze to lower lip beneath ETT. Prevalon heel lift boots. Monitor the wound area(s) for worsening of condition such as: Signs/symptoms of infection,  Increase in size,  Development of or worsening of odor, Development of pain, or increased pain at the affected locations.  Notify the medical team if any of these develop.  Thank you for the consult.  Discussed plan of care with the bedside nurse.  WOC nurse will not follow at this time.  Please re-consult the WOC team if needed.  Helmut Muster, RN, MSN, CWOCN, CNS-BC, pager [phone]
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
## 2019-06-15 NOTE — Progress Notes (Signed)
Patient ID: [name], male   DOB: 07/11/1955, 64 y.o.   MRN: [id number]  This NP visited patient at the bedside as a follow up for palliative medicine needs and emotional support.  Reviewed medical records and assessed the patient.    Patient remains intubated, grim prognosis for meaningful neurologic recovery. Unable to follow commands.    Created space and opportunity for Inetta Fermo Rogacki/wife to explore her thoughts and feelings regarding her husband's current medical situation.  She verbalizes an understanding of the seriousness of the patient's current medical situation and the grim prognosis for neurologic meaningful recovery.  Plan is to continue current level of care through Saturday, May 15 when family plans to gather and liberate the patient from the ventilator and allowing for a natural death.  Wife verbalizes that she would like for herself and the patient's son/Vraj and their pastor be those present at this time.  She verbalizes her appreciation of Dr. Bedelia Person and current treatment team.   Education offered on the natural trajectory and expectations at end of life after this patient is liberated from the ventilator.   Prognosis is likely hours to days.  Education offered on  grief process, wife was able to verbalize her current and  anticipatory needs preparing for  her husbands death.   Questions and concerns addressed   Discussed with bedside RN  Total time spent on the unit was 35 minutes  This nurse practitioner informed  the family  that I will not be working on Saturday but will ask PMT provider to be available for questions, concerns and needs.  Greater than 50% of the time was spent in counseling and coordination of care  Lorinda Creed NP  Palliative Medicine Team Team Phone # [phone] Pager [phone]
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
## 2019-06-15 NOTE — Progress Notes (Signed)
CRITICAL VALUE STICKER  CRITICAL VALUE: total bilirubin 18.8  MD NOTIFIED: Trauma MD on call

## 2019-06-16 ENCOUNTER — Encounter (HOSPITAL_COMMUNITY): Payer: Self-pay

## 2019-06-16 LAB — CBC
HCT: 26.1 % — ABNORMAL LOW (ref 39.0–52.0)
Hemoglobin: 7.9 g/dL — ABNORMAL LOW (ref 13.0–17.0)
MCH: 30.7 pg (ref 26.0–34.0)
MCHC: 30.3 g/dL (ref 30.0–36.0)
MCV: 101.6 fL — ABNORMAL HIGH (ref 80.0–100.0)
Platelets: 185 10*3/uL (ref 150–400)
RBC: 2.57 MIL/uL — ABNORMAL LOW (ref 4.22–5.81)
RDW: 25.2 % — ABNORMAL HIGH (ref 11.5–15.5)
WBC: 11.7 10*3/uL — ABNORMAL HIGH (ref 4.0–10.5)
nRBC: 0.8 % — ABNORMAL HIGH (ref 0.0–0.2)

## 2019-06-16 LAB — RENAL FUNCTION PANEL
Albumin: 1.6 g/dL — ABNORMAL LOW (ref 3.5–5.0)
Anion gap: 13 (ref 5–15)
BUN: 46 mg/dL — ABNORMAL HIGH (ref 8–23)
CO2: 23 mmol/L (ref 22–32)
Calcium: 8 mg/dL — ABNORMAL LOW (ref 8.9–10.3)
Chloride: 101 mmol/L (ref 98–111)
Creatinine, Ser: 2.1 mg/dL — ABNORMAL HIGH (ref 0.61–1.24)
GFR calc Af Amer: 37 mL/min — ABNORMAL LOW (ref >60)
GFR calc non Af Amer: 32 mL/min — ABNORMAL LOW (ref >60)
Glucose, Bld: 152 mg/dL — ABNORMAL HIGH (ref 70–99)
Phosphorus: 3.2 mg/dL (ref 2.5–4.6)
Potassium: 4.4 mmol/L (ref 3.5–5.1)
Sodium: 137 mmol/L (ref 135–145)

## 2019-06-16 LAB — GLUCOSE, CAPILLARY
Glucose-Capillary: 129 mg/dL — ABNORMAL HIGH (ref 70–99)
Glucose-Capillary: 133 mg/dL — ABNORMAL HIGH (ref 70–99)
Glucose-Capillary: 115 mg/dL — ABNORMAL HIGH (ref 70–99)
Glucose-Capillary: 134 mg/dL — ABNORMAL HIGH (ref 70–99)
Glucose-Capillary: 130 mg/dL — ABNORMAL HIGH (ref 70–99)
Glucose-Capillary: 108 mg/dL — ABNORMAL HIGH (ref 70–99)

## 2019-06-16 LAB — COMPREHENSIVE METABOLIC PANEL
ALT: 212 U/L — ABNORMAL HIGH (ref 0–44)
AST: 309 U/L — ABNORMAL HIGH (ref 15–41)
Albumin: 1.6 g/dL — ABNORMAL LOW (ref 3.5–5.0)
Alkaline Phosphatase: 182 U/L — ABNORMAL HIGH (ref 38–126)
Anion gap: 13 (ref 5–15)
BUN: 45 mg/dL — ABNORMAL HIGH (ref 8–23)
CO2: 21 mmol/L — ABNORMAL LOW (ref 22–32)
Calcium: 8 mg/dL — ABNORMAL LOW (ref 8.9–10.3)
Chloride: 102 mmol/L (ref 98–111)
Creatinine, Ser: 2.23 mg/dL — ABNORMAL HIGH (ref 0.61–1.24)
GFR calc Af Amer: 35 mL/min — ABNORMAL LOW (ref >60)
GFR calc non Af Amer: 30 mL/min — ABNORMAL LOW (ref >60)
Glucose, Bld: 143 mg/dL — ABNORMAL HIGH (ref 70–99)
Potassium: 4.5 mmol/L (ref 3.5–5.1)
Sodium: 136 mmol/L (ref 135–145)
Total Bilirubin: 19.7 mg/dL (ref 0.3–1.2)
Total Protein: 5.7 g/dL — ABNORMAL LOW (ref 6.5–8.1)

## 2019-06-16 LAB — TRIGLYCERIDES: Triglycerides: 406 mg/dL — ABNORMAL HIGH (ref <150)

## 2019-06-16 LAB — PHOSPHORUS: Phosphorus: 4 mg/dL (ref 2.5–4.6)

## 2019-06-16 LAB — MAGNESIUM: Magnesium: 2.6 mg/dL — ABNORMAL HIGH (ref 1.7–2.4)

## 2019-06-16 MED ORDER — DEXTROSE 10 % IV SOLN: INTRAVENOUS | Status: DC

## 2019-06-16 NOTE — Progress Notes (Signed)
Patient ID: Ian Velasquez, male   DOB: 04-Feb-1956, 64 y.o.   MRN: [id number] I spoke with his wife at the bedside.  Violeta Gelinas, MD, MPH, FACS Please use [email or web address] to contact on call provider

## 2019-06-16 NOTE — Progress Notes (Signed)
PT Cancellation Note  Patient Details Name: Ian Velasquez MRN: [id number] DOB: September 22, 1955   Cancelled Treatment:    Reason Eval/Treat Not Completed: Patient not medically ready(transitioning to comfort). Per palliative care not family planning to transition to comfort care on 5/15. PT will sign off at this time 06/16/2019.   Arlyss Gandy 06/16/2019, 10:07 AM

## 2019-06-16 NOTE — TOC Progression Note (Signed)
Transition of Care [facility]) - Progression Note    Patient Details  Name: Ian Velasquez MRN: [id number] Date of Birth: [birth year]  Transition of Care [facility]) CM/SW Contact  Glennon Mac, RN Phone Number: 06/16/2019, 3:02 PM  Clinical Narrative: Patient remains intubated, grim prognosis for meaningful neurologic recovery. Unable to follow commands. Wife has chosen to withdraw ventilator on Saturday with son and pastor present.  Will follow/offer support to family and staff as appropriate.        Expected Discharge Plan: Hospice Medical Facility Barriers to Discharge: Continued Medical Work up  Expected Discharge Plan and Services Expected Discharge Plan: Hospice Medical Facility   Discharge Planning Services: CM Consult   Living arrangements for the past 2 months: Single Family Home                                       Social Determinants of Health (SDOH) Interventions    Readmission Risk Interventions No flowsheet data found.   Quintella Baton, RN, BSN  Trauma/Neuro ICU Case Manager [phone]

## 2019-06-16 NOTE — Progress Notes (Signed)
CDS Referal [phone] and spoke with April Shore

## 2019-06-16 NOTE — Progress Notes (Signed)
OT Cancellation    06/16/19 0800  OT Visit Information  Last OT Received On 06/16/19  Reason Eval/Treat Not Completed Other (comment) (Per PMT, pt will be comfort care. will sign off. )  Luisa Dago, OT/L   Acute OT Clinical Specialist Acute Rehabilitation Services Pager [phone] Office [phone]

## 2019-06-16 NOTE — Progress Notes (Signed)
Ccm will sign off with planned transition to comfort care.  D/w Dr Janee Morn. Please call with any further questions.

## 2019-06-16 NOTE — Progress Notes (Signed)
Patient ID: Ian Velasquez, male   DOB: 19-Apr-1955, 64 y.o.   MRN: [id number] Follow up - Trauma Critical Care  Patient Details:    Ian Velasquez is an 64 y.o. male.  Lines/tubes : Airway 8 mm (Active)  Secured at (cm) 25 cm 06/16/19 0155  Measured From Lips 06/16/19 0155  Secured Location Center 06/16/19 0155  Secured By [employer] 06/16/19 0155  Tube Holder Repositioned Yes 06/16/19 0155  Cuff Pressure (cm H2O) 28 cm H2O 06/15/19 1957  Site Condition Dry 06/16/19 0155     CVC Triple Lumen 06/02/19 Left Subclavian (Active)  Indication for Insertion or Continuance of Line Vasoactive infusions 06/16/19 0713  Site Assessment Clean;Dry;Intact 06/15/19 2000  Proximal Lumen Status Infusing 06/15/19 2000  Medial Lumen Status Infusing 06/15/19 2000  Distal Lumen Status Cap changed 06/16/19 0400  Dressing Type Transparent;Occlusive 06/15/19 2000  Dressing Status Clean;Dry;Intact;Antimicrobial disc in place 06/15/19 2000  Line Care Distal tubing changed 06/16/19 0400  Dressing Intervention Other (Comment) 06/14/19 0800  Dressing Change Due 06/16/19 06/15/19 2000     Arterial Line 06/12/19 Right Femoral (Active)  Site Assessment Dry;Intact 06/15/19 2000  Line Status Pulsatile blood flow 06/15/19 2000  Art Line Waveform Appropriate 06/15/19 2000  Art Line Interventions Zeroed and calibrated;Leveled;Connections checked and tightened 06/15/19 2000  Color/Movement/Sensation Capillary refill less than 3 sec 06/15/19 2000  Dressing Type Transparent;Occlusive 06/15/19 2000  Dressing Status Intact;Old drainage;Antimicrobial disc in place 06/15/19 2000  Interventions Dressing changed;Antimicrobial disc changed 06/15/19 2000  Dressing Change Due 06/22/19 06/15/19 2000     NG/OG Tube Orogastric Left mouth (Active)  Site Assessment Clean;Dry;Intact 06/15/19 2000  Ongoing Placement Verification No change in respiratory status 06/15/19 2000  Status Suction-low intermittent 06/15/19 2000   Amount of suction 80 mmHg 06/14/19 2000  Drainage Appearance Green;Brown 06/15/19 2000  Intake (mL) 60 mL 07/03/2019 1541  Output (mL) 50 mL 06/16/19 0600     Rectal Tube/Pouch (Active)  Output (mL) 175 mL 06/16/19 0600    Microbiology/Sepsis markers: Results for orders placed or performed during the hospital encounter of 05/22/2019  Respiratory Panel by RT PCR (Flu A&B, Covid) - Nasopharyngeal Swab     Status: None   Collection Time: 06/03/2019  5:23 PM   Specimen: Nasopharyngeal Swab  Result Value Ref Range Status   SARS Coronavirus 2 by RT PCR NEGATIVE NEGATIVE Final    Comment: (NOTE) SARS-CoV-2 target nucleic acids are NOT DETECTED. The SARS-CoV-2 RNA is generally detectable in upper respiratoy specimens during the acute phase of infection. The lowest concentration of SARS-CoV-2 viral copies this assay can detect is 131 copies/mL. A negative result does not preclude SARS-Cov-2 infection and should not be used as the sole basis for treatment or other patient management decisions. A negative result may occur with  improper specimen collection/handling, submission of specimen other than nasopharyngeal swab, presence of viral mutation(s) within the areas targeted by this assay, and inadequate number of viral copies (<131 copies/mL). A negative result must be combined with clinical observations, patient history, and epidemiological information. The expected result is Negative. Fact Sheet for Patients:  https://www.moore.com/ Fact Sheet for Healthcare Providers:  https://www.young.biz/ This test is not yet ap proved or cleared by the [place] FDA and  has been authorized for detection and/or diagnosis of SARS-CoV-2 by FDA under an Emergency Use Authorization (EUA). This EUA will remain  in effect (meaning this test can be used) for the duration of the COVID-19 declaration under Section 564(b)(1) of the Act, 21 U.S.C.  section 360bbb-3(b)(1),  unless the authorization is terminated or revoked sooner.    Influenza A by PCR NEGATIVE NEGATIVE Final   Influenza B by PCR NEGATIVE NEGATIVE Final    Comment: (NOTE) The Xpert Xpress SARS-CoV-2/FLU/RSV assay is intended as an aid in  the diagnosis of influenza from Nasopharyngeal swab specimens and  should not be used as a sole basis for treatment. Nasal washings and  aspirates are unacceptable for Xpert Xpress SARS-CoV-2/FLU/RSV  testing. Fact Sheet for Patients: https://www.moore.com/https://www.fda.gov/media/142436/download Fact Sheet for Healthcare Providers: https://www.young.biz/https://www.fda.gov/media/142435/download This test is not yet approved or cleared by the [place] FDA and  has been authorized for detection and/or diagnosis of SARS-CoV-2 by  FDA under an Emergency Use Authorization (EUA). This EUA will remain  in effect (meaning this test can be used) for the duration of the  Covid-19 declaration under Section 564(b)(1) of the Act, 21  U.S.C. section 360bbb-3(b)(1), unless the authorization is  terminated or revoked. Performed at [facility], [address] [place], [place] [address]   MRSA PCR Screening     Status: None   Collection Time: 08/03/19  8:16 PM   Specimen: Nasopharyngeal  Result Value Ref Range Status   MRSA by PCR NEGATIVE NEGATIVE Final    Comment:        The GeneXpert MRSA Assay (FDA approved for NASAL specimens only), is one component of a comprehensive MRSA colonization surveillance program. It is not intended to diagnose MRSA infection nor to guide or monitor treatment for MRSA infections. Performed at [facility], [address] [place], [place] [address]   Culture, Urine     Status: Abnormal   Collection Time: 05/30/19  8:44 AM   Specimen: Urine, Clean Catch  Result Value Ref Range Status   Specimen Description URINE, CLEAN CATCH  Final   Special Requests NONE  Final   Culture (A)  Final    <10,000 COLONIES/mL INSIGNIFICANT GROWTH Performed at [facility], [address] [place], [place] [address]    Report Status 05/31/2019 FINAL  Final  Culture, blood (routine x 2)     Status: Abnormal   Collection Time: 05/30/19  9:50 AM   Specimen: BLOOD  Result Value Ref Range Status   Specimen Description BLOOD LEFT ANTECUBITAL  Final   Special Requests   Final    BOTTLES DRAWN AEROBIC ONLY Blood Culture results may not be optimal due to an inadequate volume of blood received in culture bottles   Culture  Setup Time   Final    GRAM POSITIVE COCCI IN CLUSTERS AEROBIC BOTTLE ONLY CRITICAL RESULT CALLED TO, READ BACK BY AND VERIFIED WITH: Melven SartoriusJ LEDFORD [facility] 05/31/19 0013 JDW Performed at [facility], [address] [place], [place] [address]    Culture STAPHYLOCOCCUS AUREUS (A)  Final   Report Status 06/01/2019 FINAL  Final   Organism ID, Bacteria STAPHYLOCOCCUS AUREUS  Final      Susceptibility   Staphylococcus aureus - MIC*    CIPROFLOXACIN <=0.5 SENSITIVE Sensitive     ERYTHROMYCIN <=0.25 SENSITIVE Sensitive     GENTAMICIN <=0.5 SENSITIVE Sensitive     OXACILLIN 0.5 SENSITIVE Sensitive     TETRACYCLINE <=1 SENSITIVE Sensitive     VANCOMYCIN <=0.5 SENSITIVE Sensitive     TRIMETH/SULFA <=10 SENSITIVE Sensitive     CLINDAMYCIN <=0.25 SENSITIVE Sensitive     RIFAMPIN <=0.5 SENSITIVE Sensitive     Inducible Clindamycin NEGATIVE Sensitive     * STAPHYLOCOCCUS  AUREUS  Blood Culture ID Panel (Reflexed)     Status: Abnormal   Collection Time: 05/30/19  9:50 AM  Result Value Ref Range Status   Enterococcus species NOT DETECTED NOT DETECTED Final   Listeria monocytogenes NOT DETECTED NOT DETECTED Final   Staphylococcus species DETECTED (A) NOT DETECTED Final    Comment: CRITICAL RESULT CALLED TO, READ BACK BY AND VERIFIED WITH: J LEDFORD PHARMD 05/31/19 0013 JDW    Staphylococcus aureus (BCID) DETECTED (A) NOT DETECTED Final    Comment: Methicillin (oxacillin) susceptible Staphylococcus aureus (MSSA). Preferred therapy is anti  staphylococcal beta lactam antibiotic (Cefazolin or Nafcillin), unless clinically contraindicated. CRITICAL RESULT CALLED TO, READ BACK BY AND VERIFIED WITH: J LEDFORD [facility] 05/31/19 0013 JDW    Methicillin resistance NOT DETECTED NOT DETECTED Final   Streptococcus species NOT DETECTED NOT DETECTED Final   Streptococcus agalactiae NOT DETECTED NOT DETECTED Final   Streptococcus pneumoniae NOT DETECTED NOT DETECTED Final   Streptococcus pyogenes NOT DETECTED NOT DETECTED Final   Acinetobacter baumannii NOT DETECTED NOT DETECTED Final   Enterobacteriaceae species NOT DETECTED NOT DETECTED Final   Enterobacter cloacae complex NOT DETECTED NOT DETECTED Final   Escherichia coli NOT DETECTED NOT DETECTED Final   Klebsiella oxytoca NOT DETECTED NOT DETECTED Final   Klebsiella pneumoniae NOT DETECTED NOT DETECTED Final   Proteus species NOT DETECTED NOT DETECTED Final   Serratia marcescens NOT DETECTED NOT DETECTED Final   Haemophilus influenzae NOT DETECTED NOT DETECTED Final   Neisseria meningitidis NOT DETECTED NOT DETECTED Final   Pseudomonas aeruginosa NOT DETECTED NOT DETECTED Final   Candida albicans NOT DETECTED NOT DETECTED Final   Candida glabrata NOT DETECTED NOT DETECTED Final   Candida krusei NOT DETECTED NOT DETECTED Final   Candida parapsilosis NOT DETECTED NOT DETECTED Final   Candida tropicalis NOT DETECTED NOT DETECTED Final    Comment: Performed at [facility], [address], [place], [place] [address]  Culture, blood (routine x 2)     Status: Abnormal   Collection Time: 05/30/19  9:55 AM   Specimen: BLOOD  Result Value Ref Range Status   Specimen Description BLOOD LEFT ANTECUBITAL  Final   Special Requests   Final    BOTTLES DRAWN AEROBIC ONLY Blood Culture results may not be optimal due to an inadequate volume of blood received in culture bottles   Culture  Setup Time   Final    AEROBIC BOTTLE ONLY GRAM POSITIVE COCCI IN CLUSTERS CRITICAL VALUE NOTED.  VALUE IS  CONSISTENT WITH PREVIOUSLY REPORTED AND CALLED VALUE.    Culture (A)  Final    STAPHYLOCOCCUS AUREUS SUSCEPTIBILITIES PERFORMED ON PREVIOUS CULTURE WITHIN THE LAST 5 DAYS. Performed at [facility], [address] [place], [place] [address]    Report Status 06/01/2019 FINAL  Final  Culture, blood (routine x 2)     Status: None   Collection Time: 05/31/19  7:12 AM   Specimen: BLOOD RIGHT HAND  Result Value Ref Range Status   Specimen Description BLOOD RIGHT HAND  Final   Special Requests   Final    BOTTLES DRAWN AEROBIC ONLY Blood Culture results may not be optimal due to an inadequate volume of blood received in culture bottles   Culture   Final    NO GROWTH 5 DAYS Performed at [facility], [address], [place], [place] [address]    Report Status 06/05/2019 FINAL  Final  Culture, blood (routine x 2)  Status: None   Collection Time: 05/31/19  7:45 AM   Specimen: BLOOD LEFT ARM  Result Value Ref Range Status   Specimen Description BLOOD LEFT ARM  Final   Special Requests   Final    BOTTLES DRAWN AEROBIC ONLY Blood Culture results may not be optimal due to an inadequate volume of blood received in culture bottles   Culture   Final    NO GROWTH 5 DAYS Performed at [facility], [address], [place], [place] [address]    Report Status 06/05/2019 FINAL  Final  Culture, respiratory (non-expectorated)     Status: None   Collection Time: 06/02/19  9:17 AM   Specimen: Tracheal Aspirate; Respiratory  Result Value Ref Range Status   Specimen Description TRACHEAL ASPIRATE  Final   Special Requests Normal  Final   Gram Stain   Final    NO WBC SEEN ABUNDANT GRAM NEGATIVE RODS RARE YEAST Performed at [facility], [address] [place], [place] [address]    Culture ABUNDANT ESCHERICHIA COLI  Final   Report Status 06/04/2019 FINAL  Final   Organism ID, Bacteria ESCHERICHIA COLI  Final      Susceptibility   Escherichia coli - MIC*    AMPICILLIN >=32  RESISTANT Resistant     CEFAZOLIN 8 SENSITIVE Sensitive     CEFEPIME <=1 SENSITIVE Sensitive     CEFTAZIDIME <=1 SENSITIVE Sensitive     CEFTRIAXONE <=1 SENSITIVE Sensitive     CIPROFLOXACIN 1 SENSITIVE Sensitive     GENTAMICIN <=1 SENSITIVE Sensitive     IMIPENEM <=0.25 SENSITIVE Sensitive     TRIMETH/SULFA <=20 SENSITIVE Sensitive     AMPICILLIN/SULBACTAM >=32 RESISTANT Resistant     PIP/TAZO 64 INTERMEDIATE Intermediate     * ABUNDANT ESCHERICHIA COLI  Culture, Urine     Status: None   Collection Time: 06/12/19  3:50 PM   Specimen: Urine, Catheterized  Result Value Ref Range Status   Specimen Description URINE, CATHETERIZED  Final   Special Requests Normal  Final   Culture   Final    NO GROWTH Performed at [facility], [address], [place], [place] [address]    Report Status 06/13/2019 FINAL  Final  Culture, blood (Routine X 2) w Reflex to ID Panel     Status: None (Preliminary result)   Collection Time: 06/12/19  4:22 PM   Specimen: BLOOD RIGHT HAND  Result Value Ref Range Status   Specimen Description BLOOD RIGHT HAND  Final   Special Requests   Final    BOTTLES DRAWN AEROBIC AND ANAEROBIC Blood Culture adequate volume   Culture   Final    NO GROWTH 3 DAYS Performed at [facility], [address], [place], [place] [address]    Report Status PENDING  Incomplete  Culture, respiratory (non-expectorated)     Status: None   Collection Time: 06/12/19  8:34 PM   Specimen: Tracheal Aspirate; Respiratory  Result Value Ref Range Status   Specimen Description TRACHEAL ASPIRATE  Final   Special Requests NONE  Final   Gram Stain   Final    RARE WBC PRESENT,BOTH PMN AND MONONUCLEAR MODERATE GRAM NEGATIVE RODS RARE GRAM POSITIVE COCCI IN PAIRS Performed at [facility], [address], [place], [place] [address]    Culture   Final    MODERATE ENTEROBACTER AEROGENES MODERATE ESCHERICHIA COLI    Report Status 06/15/2019 FINAL  Final   Organism ID,  Bacteria ENTEROBACTER AEROGENES  Final   Organism ID, Bacteria ESCHERICHIA COLI  Final      Susceptibility   Enterobacter aerogenes - MIC*    CEFAZOLIN >=64 RESISTANT Resistant     CEFEPIME <=1 SENSITIVE Sensitive     CEFTAZIDIME 16 INTERMEDIATE Intermediate     CEFTRIAXONE 16 INTERMEDIATE Intermediate     CIPROFLOXACIN <=0.25 SENSITIVE Sensitive     GENTAMICIN <=1 SENSITIVE Sensitive     IMIPENEM 1 SENSITIVE Sensitive     TRIMETH/SULFA <=20 SENSITIVE Sensitive     PIP/TAZO >=128 RESISTANT Resistant     * MODERATE ENTEROBACTER AEROGENES   Escherichia coli - MIC*    AMPICILLIN >=32 RESISTANT Resistant     CEFAZOLIN <=4 SENSITIVE Sensitive     CEFEPIME <=1 SENSITIVE Sensitive     CEFTAZIDIME <=1 SENSITIVE Sensitive     CEFTRIAXONE <=1 SENSITIVE Sensitive     CIPROFLOXACIN 0.5 SENSITIVE Sensitive     GENTAMICIN <=1 SENSITIVE Sensitive     IMIPENEM <=0.25 SENSITIVE Sensitive     TRIMETH/SULFA <=20 SENSITIVE Sensitive     AMPICILLIN/SULBACTAM 16 INTERMEDIATE Intermediate     PIP/TAZO <=4 SENSITIVE Sensitive     * MODERATE ESCHERICHIA COLI  C Difficile Quick Screen w PCR reflex     Status: None   Collection Time: 06/12/19 10:41 PM   Specimen: STOOL  Result Value Ref Range Status   C Diff antigen NEGATIVE NEGATIVE Final   C Diff toxin NEGATIVE NEGATIVE Final   C Diff interpretation No C. difficile detected.  Final    Comment: Performed at [facility], [address] [place], [place] [address]    Anti-infectives:  Anti-infectives (From admission, onward)   Start     Dose/Rate Route Frequency Ordered Stop   06/13/19 1900  vancomycin (VANCOCIN) 1,000 mg in sodium chloride 0.9 % 100 mL IVPB  Status:  Discontinued     1,000 mg 100 mL/hr over 60 Minutes Intravenous Every 24 hours 06/13/19 0947 06/14/19 0915   06/13/19 1830  vancomycin (VANCOCIN) IVPB 1000 mg/200 mL premix  Status:  Discontinued     1,000 mg 200 mL/hr over 60 Minutes Intravenous Every 24 hours 06/12/19 1704  06/13/19 0947   06/13/19 0900  metroNIDAZOLE (FLAGYL) IVPB 500 mg  Status:  Discontinued     500 mg 100 mL/hr over 60 Minutes Intravenous Every 8 hours 06/13/19 0853 06/14/19 0915   06/12/19 1800  vancomycin (VANCOREADY) IVPB 2000 mg/400 mL     2,000 mg 200 mL/hr over 120 Minutes Intravenous  Once 06/12/19 1704 06/12/19 1937   06/12/19 1200  piperacillin-tazobactam (ZOSYN) IVPB 3.375 g  Status:  Discontinued     3.375 g 100 mL/hr over 30 Minutes Intravenous Every 6 hours 06/12/19 0846 06/12/19 0846   06/12/19 1000  ceFEPIme (MAXIPIME) 2 g in sodium chloride 0.9 % 100 mL IVPB     2 g 200 mL/hr over 30 Minutes Intravenous Every 12 hours 06/12/19 0912 06/22/19 0959   06/12/19 0900  piperacillin-tazobactam (ZOSYN) IVPB 3.375 g  Status:  Discontinued     3.375 g 100 mL/hr over 30 Minutes Intravenous Every 6 hours 06/12/19 0846 06/12/19 0912   06/10/19 1400  nafcillin 12 g in sodium chloride 0.9 % 500 mL continuous infusion  Status:  Discontinued     12 g 20.8 mL/hr over 24 Hours Intravenous Every 24 hours 06/09/19 1533 06/09/19 1608   06/10/19 0200  nafcillin 12 g in sodium chloride 0.9 % 500 mL continuous infusion  Status:  Discontinued     12 g 20.8 mL/hr over 24 Hours Intravenous Every 24 hours 06/09/19 1608 06/12/19 0805   06/09/19 2124  ceFAZolin (ANCEF) IVPB 1 g/50 mL premix  Status:  Discontinued     1 g 100 mL/hr over 30 Minutes Intravenous Every 24 hours 06/09/19 0746 06/09/19 1301   06/09/19 1400  ceFAZolin (ANCEF) IVPB 2g/100 mL premix  Status:  Discontinued     2 g 200 mL/hr over 30 Minutes Intravenous Every 12 hours 06/09/19 1301 06/09/19 1533   06/05/19 2030  ceFAZolin (ANCEF) IVPB 2g/100 mL premix  Status:  Discontinued     2 g 200 mL/hr over 30 Minutes Intravenous Every 12 hours 06/05/19 0905 06/09/19 0746   06/03/19 2300  ceFEPIme (MAXIPIME) 2 g in sodium chloride 0.9 % 100 mL IVPB  Status:  Discontinued     2 g 200 mL/hr over 30 Minutes Intravenous Every 24 hours  06/03/19 0641 06/03/19 1831   06/03/19 2000  ceFEPIme (MAXIPIME) 2 g in sodium chloride 0.9 % 100 mL IVPB  Status:  Discontinued     2 g 200 mL/hr over 30 Minutes Intravenous Every 12 hours 06/03/19 1830 06/05/19 0905   06/02/19 1000  clindamycin (CLEOCIN) IVPB 600 mg  Status:  Discontinued     600 mg 100 mL/hr over 30 Minutes Intravenous Every 8 hours 06/02/19 0142 06/02/19 0812   06/02/19 0830  ceFEPIme (MAXIPIME) 2 g in sodium chloride 0.9 % 100 mL IVPB  Status:  Discontinued     2 g 200 mL/hr over 30 Minutes Intravenous Every 12 hours 06/02/19 0812 06/03/19 0641   06/02/19 0815  metroNIDAZOLE (FLAGYL) IVPB 500 mg  Status:  Discontinued     500 mg 100 mL/hr over 60 Minutes Intravenous Every 8 hours 06/02/19 0812 06/05/19 0905   06/02/19 0200  clindamycin (CLEOCIN) IVPB 600 mg     600 mg 100 mL/hr over 30 Minutes Intravenous  Once 06/02/19 0142 06/02/19 0254   05/31/19 1330  nafcillin 12 g in sodium chloride 0.9 % 500 mL continuous infusion  Status:  Discontinued     12 g 20.8 mL/hr over 24 Hours Intravenous Every 24 hours 05/31/19 1052 06/02/19 0812   05/31/19 0200  nafcillin 2 g in sodium chloride 0.9 % 100 mL IVPB  Status:  Discontinued     2 g 200 mL/hr over 30 Minutes Intravenous Every 4 hours 05/31/19 0055 05/31/19 1052   05/31/19 0115  nafcillin injection 2 g  Status:  Discontinued     2 g Intravenous Every 4 hours 05/31/19 0027 05/31/19 0054      Best Practice/Protocols:  VTE Prophylaxis: Heparin (SQ) Continous Sedation  Consults: Treatment Team:  Yolonda Kida, MD Estanislado Emms, MD    Studies:    Events:  Subjective:    Overnight Issues:   Objective:  Vital signs for last 24 hours: Temp:  [97.9 F (36.6 C)-99.7 F (37.6 C)] 98.4 F (36.9 C) (05/13 0700) Pulse Rate:  [63-100] 65 (05/13 0700) Resp:  [21-38] 35 (05/13 0700) BP: (63-145)/(46-99) 114/57 (05/13 0700) SpO2:  [88 %-100 %] 98 % (05/13 0700) Arterial Line BP: (85-161)/(37-67) 116/53  (05/13 0700) FiO2 (%):  [40 %] 40 % (05/13 0400) Weight:  [108.1 kg] 108.1 kg (05/13 0500)  Hemodynamic parameters for last 24 hours:    Intake/Output from previous day: 05/12 0701 - 05/13 0700 In: 2096.3 [I.V.:1896.3; IV Piggyback:199.9] Out: 6407 [Emesis/NG output:300; Stool:200]  Intake/Output this shift: No intake/output  data recorded.  Vent settings for last 24 hours: Vent Mode: PRVC FiO2 (%):  [40 %] 40 % Set Rate:  [35 bmp] 35 bmp Vt Set:  [500 mL] 500 mL PEEP:  [5 cmH20] 5 cmH20 Pressure Support:  [10 cmH20] 10 cmH20 Plateau Pressure:  [24 cmH20-25 cmH20] 24 cmH20  Physical Exam:  General: on vent Neuro: pupils 20mm, opens eyes but doea not F/C HEENT/Neck: ETT Resp: clear to auscultation bilaterally CVS: RRR 60 GI: soft, NT, some BS Extremities: edema 1+  Results for orders placed or performed during the hospital encounter of 05/23/2019 (from the past 24 hour(s))  Ammonia     Status: Abnormal   Collection Time: 06/15/19 10:06 AM  Result Value Ref Range   Ammonia 45 (H) 9 - 35 umol/L  Glucose, capillary     Status: None   Collection Time: 06/15/19 11:19 AM  Result Value Ref Range   Glucose-Capillary 91 70 - 99 mg/dL   Comment 1 Notify RN    Comment 2 Document in Chart   Glucose, capillary     Status: None   Collection Time: 06/15/19  3:18 PM  Result Value Ref Range   Glucose-Capillary 97 70 - 99 mg/dL   Comment 1 Notify RN    Comment 2 Document in Chart   Renal function panel (daily at 1600)     Status: Abnormal   Collection Time: 06/15/19  4:54 PM  Result Value Ref Range   Sodium 135 135 - 145 mmol/L   Potassium 4.4 3.5 - 5.1 mmol/L   Chloride 101 98 - 111 mmol/L   CO2 22 22 - 32 mmol/L   Glucose, Bld 115 (H) 70 - 99 mg/dL   BUN 42 (H) 8 - 23 mg/dL   Creatinine, Ser 3.87 (H) 0.61 - 1.24 mg/dL   Calcium 8.0 (L) 8.9 - 10.3 mg/dL   Phosphorus 4.3 2.5 - 4.6 mg/dL   Albumin 1.5 (L) 3.5 - 5.0 g/dL   GFR calc non Af Amer 33 (L) >60 mL/min   GFR calc Af  Amer 39 (L) >60 mL/min   Anion gap 12 5 - 15  Glucose, capillary     Status: None   Collection Time: 06/15/19  7:49 PM  Result Value Ref Range   Glucose-Capillary 97 70 - 99 mg/dL  Glucose, capillary     Status: Abnormal   Collection Time: 06/15/19 11:32 PM  Result Value Ref Range   Glucose-Capillary 115 (H) 70 - 99 mg/dL  Glucose, capillary     Status: Abnormal   Collection Time: 06/16/19  3:21 AM  Result Value Ref Range   Glucose-Capillary 129 (H) 70 - 99 mg/dL  Comprehensive metabolic panel     Status: Abnormal   Collection Time: 06/16/19  4:25 AM  Result Value Ref Range   Sodium 136 135 - 145 mmol/L   Potassium 4.5 3.5 - 5.1 mmol/L   Chloride 102 98 - 111 mmol/L   CO2 21 (L) 22 - 32 mmol/L   Glucose, Bld 143 (H) 70 - 99 mg/dL   BUN 45 (H) 8 - 23 mg/dL   Creatinine, Ser 5.64 (H) 0.61 - 1.24 mg/dL   Calcium 8.0 (L) 8.9 - 10.3 mg/dL   Total Protein 5.7 (L) 6.5 - 8.1 g/dL   Albumin 1.6 (L) 3.5 - 5.0 g/dL   AST 332 (H) 15 - 41 U/L   ALT 212 (H) 0 - 44 U/L   Alkaline Phosphatase 182 (H) 38 - 126 U/L  Total Bilirubin 19.7 (HH) 0.3 - 1.2 mg/dL   GFR calc non Af Amer 30 (L) >60 mL/min   GFR calc Af Amer 35 (L) >60 mL/min   Anion gap 13 5 - 15  Magnesium     Status: Abnormal   Collection Time: 06/16/19  4:25 AM  Result Value Ref Range   Magnesium 2.6 (H) 1.7 - 2.4 mg/dL  CBC     Status: Abnormal   Collection Time: 06/16/19  4:25 AM  Result Value Ref Range   WBC 11.7 (H) 4.0 - 10.5 K/uL   RBC 2.57 (L) 4.22 - 5.81 MIL/uL   Hemoglobin 7.9 (L) 13.0 - 17.0 g/dL   HCT 16.1 (L) 09.6 - 04.5 %   MCV 101.6 (H) 80.0 - 100.0 fL   MCH 30.7 26.0 - 34.0 pg   MCHC 30.3 30.0 - 36.0 g/dL   RDW 40.9 (H) 81.1 - 91.4 %   Platelets 185 150 - 400 K/uL   nRBC 0.8 (H) 0.0 - 0.2 %  Triglycerides     Status: Abnormal   Collection Time: 06/16/19  4:25 AM  Result Value Ref Range   Triglycerides 406 (H) <150 mg/dL  Phosphorus     Status: None   Collection Time: 06/16/19  4:25 AM  Result Value  Ref Range   Phosphorus 4.0 2.5 - 4.6 mg/dL    Assessment & Plan: Present on Admission: . Closed displaced fracture of body of left scapula . Traumatic brain injury with loss of consciousness (HCC) . OSA (obstructive sleep apnea) . RLS (restless legs syndrome) . Essential hypertension . Acute blood loss anemia . Multiple trauma . Tachycardia . Multiple closed fractures of ribs of left side . Abdominal distention . Closed left scapular fracture    LOS: 23 days   Additional comments:I reviewed the patient's new clinical lab test results. . [facility]   Acute hypoxic respiratory failure with ARDS - previously resolved, new insult 5/9 with increased vent req'ts, now back on 40% and 5 SAH, concussion - NSGY c/s (Dr. Maurice Small), stable CT head 4/21, Keppra x7d for sz ppx, MR 5/6 with shear injury, expected.  Comminuted left scapula fx- sling for now, ortho c/s (Dr. 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 As above. Malnutrition - restart TPN today Hyperbilirubinemia - likely reabsorption of scattered hematomas. RUQ u/s as above.  AKI - CRRT per Renal, UF as much as possible, no more than 10 of levophed FEN - hold TF, restarted TPN 5/12, transaminitis improved Hyperglycemia - lantus restarted 5/12 VTE  - SCDs, SQH (AKI) Dispo - ICU Per additional GOC discussion by Dr. Bedelia Person with family, plan terminal extubation 5/15. In light of this will not change central lines. I also D/W Dr. Kathrene Bongo on the unit. Critical Care Total Time*: 39 Minutes  Violeta Gelinas, MD, MPH, FACS Trauma & General Surgery  Use [email or web address] to contact on call provider  06/16/2019  *Care during the described time interval was provided by me. I have reviewed this patient's available data, including medical history, events of note, physical examination and test results as part of my evaluation.
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
## 2019-06-17 LAB — RENAL FUNCTION PANEL
Albumin: 1.6 g/dL — ABNORMAL LOW (ref 3.5–5.0)
Albumin: 1.6 g/dL — ABNORMAL LOW (ref 3.5–5.0)
Anion gap: 11 (ref 5–15)
Anion gap: 13 (ref 5–15)
BUN: 39 mg/dL — ABNORMAL HIGH (ref 8–23)
BUN: 35 mg/dL — ABNORMAL HIGH (ref 8–23)
CO2: 23 mmol/L (ref 22–32)
CO2: 20 mmol/L — ABNORMAL LOW (ref 22–32)
Calcium: 8.2 mg/dL — ABNORMAL LOW (ref 8.9–10.3)
Calcium: 8.1 mg/dL — ABNORMAL LOW (ref 8.9–10.3)
Chloride: 101 mmol/L (ref 98–111)
Chloride: 100 mmol/L (ref 98–111)
Creatinine, Ser: 1.94 mg/dL — ABNORMAL HIGH (ref 0.61–1.24)
Creatinine, Ser: 2.02 mg/dL — ABNORMAL HIGH (ref 0.61–1.24)
GFR calc Af Amer: 41 mL/min — ABNORMAL LOW
GFR calc Af Amer: 39 mL/min — ABNORMAL LOW (ref >60)
GFR calc non Af Amer: 36 mL/min — ABNORMAL LOW
GFR calc non Af Amer: 34 mL/min — ABNORMAL LOW (ref >60)
Glucose, Bld: 163 mg/dL — ABNORMAL HIGH (ref 70–99)
Glucose, Bld: 142 mg/dL — ABNORMAL HIGH (ref 70–99)
Phosphorus: 3.4 mg/dL (ref 2.5–4.6)
Phosphorus: 4.2 mg/dL (ref 2.5–4.6)
Potassium: 4 mmol/L (ref 3.5–5.1)
Potassium: 4.4 mmol/L (ref 3.5–5.1)
Sodium: 135 mmol/L (ref 135–145)
Sodium: 133 mmol/L — ABNORMAL LOW (ref 135–145)

## 2019-06-17 LAB — MAGNESIUM: Magnesium: 2.6 mg/dL — ABNORMAL HIGH (ref 1.7–2.4)

## 2019-06-17 LAB — CULTURE, BLOOD (ROUTINE X 2)
Culture: NO GROWTH
Special Requests: ADEQUATE

## 2019-06-17 LAB — GLUCOSE, CAPILLARY
Glucose-Capillary: 101 mg/dL — ABNORMAL HIGH (ref 70–99)
Glucose-Capillary: 117 mg/dL — ABNORMAL HIGH (ref 70–99)
Glucose-Capillary: 121 mg/dL — ABNORMAL HIGH (ref 70–99)
Glucose-Capillary: 128 mg/dL — ABNORMAL HIGH (ref 70–99)
Glucose-Capillary: 128 mg/dL — ABNORMAL HIGH (ref 70–99)
Glucose-Capillary: 130 mg/dL — ABNORMAL HIGH (ref 70–99)

## 2019-06-17 NOTE — Progress Notes (Addendum)
Family visitors for tomorrow for end of life  5/15:  Debbie Erle [facility] Bump Morris Simon  And Renato Gails: Vonna Kotyk Hilbinger

## 2019-06-17 NOTE — Progress Notes (Signed)
Patient ID: Ian Velasquez, male   DOB: 10/16/1955, 64 y.o.   MRN: [id number] Follow up - Trauma Critical Care  Patient Details:    Ian Velasquez is an 64 y.o. male.  Lines/tubes : Airway 8 mm (Active)  Secured at (cm) 25 cm 06/17/19 0125  Measured From Lips 06/17/19 0125  Secured Location Center 06/17/19 0125  Secured By [employer] 06/17/19 0125  Tube Holder Repositioned Yes 06/17/19 0125  Cuff Pressure (cm H2O) 28 cm H2O 06/16/19 1935  Site Condition Dry 06/17/19 0125     CVC Triple Lumen 06/02/19 Left Subclavian (Active)  Indication for Insertion or Continuance of Line Vasoactive infusions 06/16/19 2000  Site Assessment Clean;Dry;Intact 06/16/19 2000  Proximal Lumen Status Cap changed 06/17/19 0200  Medial Lumen Status Occluded;No blood return 06/16/19 2000  Distal Lumen Status Infusing 06/16/19 2000  Dressing Type Transparent;Occlusive 06/16/19 2000  Dressing Status Clean;Dry;Intact;Antimicrobial disc in place 06/16/19 2000  Line Care Proximal tubing changed 06/17/19 0200  Dressing Intervention Other (Comment) 06/14/19 0800  Dressing Change Due 06/19/19 06/16/19 2000     Arterial Line 06/12/19 Right Femoral (Active)  Site Assessment Clean;Dry;Intact 06/16/19 2000  Line Status Pulsatile blood flow 06/16/19 2000  Art Line Waveform Appropriate 06/16/19 2000  Art Line Interventions Zeroed and calibrated;Leveled;Connections checked and tightened 06/16/19 2000  Color/Movement/Sensation Capillary refill less than 3 sec 06/16/19 2000  Dressing Type Transparent;Occlusive 06/16/19 2000  Dressing Status Clean;Dry;Intact;Antimicrobial disc in place 06/16/19 2000  Interventions Dressing changed;Antimicrobial disc changed 06/15/19 2000  Dressing Change Due 06/22/19 06/16/19 2000     NG/OG Tube Orogastric Left mouth (Active)  Site Assessment Clean;Dry;Intact 06/16/19 2000  Ongoing Placement Verification Xray;No change in respiratory status 06/16/19 2000  Status Suction-low  intermittent 06/16/19 2000  Amount of suction 80 mmHg 06/14/19 2000  Drainage Appearance Green;Brown 06/16/19 2000  Intake (mL) 60 mL 06/28/2019 1541  Output (mL) 100 mL 06/17/19 0300     Rectal Tube/Pouch (Active)  Output (mL) 0 mL 06/16/19 1800    Microbiology/Sepsis markers: Results for orders placed or performed during the hospital encounter of 06-17-2019  Respiratory Panel by RT PCR (Flu A&B, Covid) - Nasopharyngeal Swab     Status: None   Collection Time: 17-Jun-2019  5:23 PM   Specimen: Nasopharyngeal Swab  Result Value Ref Range Status   SARS Coronavirus 2 by RT PCR NEGATIVE NEGATIVE Final    Comment: (NOTE) SARS-CoV-2 target nucleic acids are NOT DETECTED. The SARS-CoV-2 RNA is generally detectable in upper respiratoy specimens during the acute phase of infection. The lowest concentration of SARS-CoV-2 viral copies this assay can detect is 131 copies/mL. A negative result does not preclude SARS-Cov-2 infection and should not be used as the sole basis for treatment or other patient management decisions. A negative result may occur with  improper specimen collection/handling, submission of specimen other than nasopharyngeal swab, presence of viral mutation(s) within the areas targeted by this assay, and inadequate number of viral copies (<131 copies/mL). A negative result must be combined with clinical observations, patient history, and epidemiological information. The expected result is Negative. Fact Sheet for Patients:  https://www.moore.com/ Fact Sheet for Healthcare Providers:  https://www.young.biz/ This test is not yet ap proved or cleared by the [place] FDA and  has been authorized for detection and/or diagnosis of SARS-CoV-2 by FDA under an Emergency Use Authorization (EUA). This EUA will remain  in effect (meaning this test can be used) for the duration of the COVID-19 declaration under Section 564(b)(1) of the Act, 21  U.S.C. section 360bbb-3(b)(1), unless the authorization is terminated or revoked sooner.    Influenza A by PCR NEGATIVE NEGATIVE Final   Influenza B by PCR NEGATIVE NEGATIVE Final    Comment: (NOTE) The Xpert Xpress SARS-CoV-2/FLU/RSV assay is intended as an aid in  the diagnosis of influenza from Nasopharyngeal swab specimens and  should not be used as a sole basis for treatment. Nasal washings and  aspirates are unacceptable for Xpert Xpress SARS-CoV-2/FLU/RSV  testing. Fact Sheet for Patients: PinkCheek.be Fact Sheet for Healthcare Providers: GravelBags.it This test is not yet approved or cleared by the [place] FDA and  has been authorized for detection and/or diagnosis of SARS-CoV-2 by  FDA under an Emergency Use Authorization (EUA). This EUA will remain  in effect (meaning this test can be used) for the duration of the  Covid-19 declaration under Section 564(b)(1) of the Act, 21  U.S.C. section 360bbb-3(b)(1), unless the authorization is  terminated or revoked. Performed at [facility], [place] [address], [place], [place] [address]   MRSA PCR Screening     Status: None   Collection Time: 05/16/2019  8:16 PM   Specimen: Nasopharyngeal  Result Value Ref Range Status   MRSA by PCR NEGATIVE NEGATIVE Final    Comment:        The GeneXpert MRSA Assay (FDA approved for NASAL specimens only), is one component of a comprehensive MRSA colonization surveillance program. It is not intended to diagnose MRSA infection nor to guide or monitor treatment for MRSA infections. Performed at [facility], [place] [address], [place], [place] [address]   Culture, Urine     Status: Abnormal   Collection Time: 05/30/19  8:44 AM   Specimen: Urine, Clean Catch  Result Value Ref Range Status   Specimen Description URINE, CLEAN CATCH  Final   Special Requests NONE  Final   Culture (A)  Final    <10,000 COLONIES/mL  INSIGNIFICANT GROWTH Performed at [facility], [place] [address] [place], [place] [address]    Report Status 05/31/2019 FINAL  Final  Culture, blood (routine x 2)     Status: Abnormal   Collection Time: 05/30/19  9:50 AM   Specimen: BLOOD  Result Value Ref Range Status   Specimen Description BLOOD LEFT ANTECUBITAL  Final   Special Requests   Final    BOTTLES DRAWN AEROBIC ONLY Blood Culture results may not be optimal due to an inadequate volume of blood received in culture bottles   Culture  Setup Time   Final    GRAM POSITIVE COCCI IN CLUSTERS AEROBIC BOTTLE ONLY CRITICAL RESULT CALLED TO, READ BACK BY AND VERIFIED WITH: Karsten Ro [facility] 05/31/19 0013 JDW Performed at [facility], [place] [address], [place], [place] [address]    Culture STAPHYLOCOCCUS AUREUS (A)  Final   Report Status 06/01/2019 FINAL  Final   Organism ID, Bacteria STAPHYLOCOCCUS AUREUS  Final      Susceptibility   Staphylococcus aureus - MIC*    CIPROFLOXACIN <=0.5 SENSITIVE Sensitive     ERYTHROMYCIN <=0.25 SENSITIVE Sensitive     GENTAMICIN <=0.5 SENSITIVE Sensitive     OXACILLIN 0.5 SENSITIVE Sensitive     TETRACYCLINE <=1 SENSITIVE Sensitive     VANCOMYCIN <=0.5 SENSITIVE Sensitive     TRIMETH/SULFA <=10 SENSITIVE Sensitive     CLINDAMYCIN <=0.25 SENSITIVE Sensitive     RIFAMPIN <=0.5 SENSITIVE Sensitive     Inducible Clindamycin NEGATIVE Sensitive     * STAPHYLOCOCCUS  AUREUS  Blood Culture ID Panel (Reflexed)     Status: Abnormal   Collection Time: 05/30/19  9:50 AM  Result Value Ref Range Status   Enterococcus species NOT DETECTED NOT DETECTED Final   Listeria monocytogenes NOT DETECTED NOT DETECTED Final   Staphylococcus species DETECTED (A) NOT DETECTED Final    Comment: CRITICAL RESULT CALLED TO, READ BACK BY AND VERIFIED WITH: J LEDFORD PHARMD 05/31/19 0013 JDW    Staphylococcus aureus (BCID) DETECTED (A) NOT DETECTED Final    Comment: Methicillin (oxacillin) susceptible Staphylococcus  aureus (MSSA). Preferred therapy is anti staphylococcal beta lactam antibiotic (Cefazolin or Nafcillin), unless clinically contraindicated. CRITICAL RESULT CALLED TO, READ BACK BY AND VERIFIED WITH: J LEDFORD [facility] 05/31/19 0013 JDW    Methicillin resistance NOT DETECTED NOT DETECTED Final   Streptococcus species NOT DETECTED NOT DETECTED Final   Streptococcus agalactiae NOT DETECTED NOT DETECTED Final   Streptococcus pneumoniae NOT DETECTED NOT DETECTED Final   Streptococcus pyogenes NOT DETECTED NOT DETECTED Final   Acinetobacter baumannii NOT DETECTED NOT DETECTED Final   Enterobacteriaceae species NOT DETECTED NOT DETECTED Final   Enterobacter cloacae complex NOT DETECTED NOT DETECTED Final   Escherichia coli NOT DETECTED NOT DETECTED Final   Klebsiella oxytoca NOT DETECTED NOT DETECTED Final   Klebsiella pneumoniae NOT DETECTED NOT DETECTED Final   Proteus species NOT DETECTED NOT DETECTED Final   Serratia marcescens NOT DETECTED NOT DETECTED Final   Haemophilus influenzae NOT DETECTED NOT DETECTED Final   Neisseria meningitidis NOT DETECTED NOT DETECTED Final   Pseudomonas aeruginosa NOT DETECTED NOT DETECTED Final   Candida albicans NOT DETECTED NOT DETECTED Final   Candida glabrata NOT DETECTED NOT DETECTED Final   Candida krusei NOT DETECTED NOT DETECTED Final   Candida parapsilosis NOT DETECTED NOT DETECTED Final   Candida tropicalis NOT DETECTED NOT DETECTED Final    Comment: Performed at [facility], [address] [place], [place] [address]  Culture, blood (routine x 2)     Status: Abnormal   Collection Time: 05/30/19  9:55 AM   Specimen: BLOOD  Result Value Ref Range Status   Specimen Description BLOOD LEFT ANTECUBITAL  Final   Special Requests   Final    BOTTLES DRAWN AEROBIC ONLY Blood Culture results may not be optimal due to an inadequate volume of blood received in culture bottles   Culture  Setup Time   Final    AEROBIC BOTTLE ONLY GRAM POSITIVE COCCI IN  CLUSTERS CRITICAL VALUE NOTED.  VALUE IS CONSISTENT WITH PREVIOUSLY REPORTED AND CALLED VALUE.    Culture (A)  Final    STAPHYLOCOCCUS AUREUS SUSCEPTIBILITIES PERFORMED ON PREVIOUS CULTURE WITHIN THE LAST 5 DAYS. Performed at [facility], [address] [place], [place] [address]    Report Status 06/01/2019 FINAL  Final  Culture, blood (routine x 2)     Status: None   Collection Time: 05/31/19  7:12 AM   Specimen: BLOOD RIGHT HAND  Result Value Ref Range Status   Specimen Description BLOOD RIGHT HAND  Final   Special Requests   Final    BOTTLES DRAWN AEROBIC ONLY Blood Culture results may not be optimal due to an inadequate volume of blood received in culture bottles   Culture   Final    NO GROWTH 5 DAYS Performed at [facility], [address] [place], [place] [address]    Report Status 06/05/2019 FINAL  Final  Culture, blood (routine x 2)  Status: None   Collection Time: 05/31/19  7:45 AM   Specimen: BLOOD LEFT ARM  Result Value Ref Range Status   Specimen Description BLOOD LEFT ARM  Final   Special Requests   Final    BOTTLES DRAWN AEROBIC ONLY Blood Culture results may not be optimal due to an inadequate volume of blood received in culture bottles   Culture   Final    NO GROWTH 5 DAYS Performed at [facility], [address] [place], [place] [address]    Report Status 06/05/2019 FINAL  Final  Culture, respiratory (non-expectorated)     Status: None   Collection Time: 06/02/19  9:17 AM   Specimen: Tracheal Aspirate; Respiratory  Result Value Ref Range Status   Specimen Description TRACHEAL ASPIRATE  Final   Special Requests Normal  Final   Gram Stain   Final    NO WBC SEEN ABUNDANT GRAM NEGATIVE RODS RARE YEAST Performed at [facility], [address] [place], [place] [address]    Culture ABUNDANT ESCHERICHIA COLI  Final   Report Status 06/04/2019 FINAL  Final   Organism ID, Bacteria ESCHERICHIA COLI  Final      Susceptibility    Escherichia coli - MIC*    AMPICILLIN >=32 RESISTANT Resistant     CEFAZOLIN 8 SENSITIVE Sensitive     CEFEPIME <=1 SENSITIVE Sensitive     CEFTAZIDIME <=1 SENSITIVE Sensitive     CEFTRIAXONE <=1 SENSITIVE Sensitive     CIPROFLOXACIN 1 SENSITIVE Sensitive     GENTAMICIN <=1 SENSITIVE Sensitive     IMIPENEM <=0.25 SENSITIVE Sensitive     TRIMETH/SULFA <=20 SENSITIVE Sensitive     AMPICILLIN/SULBACTAM >=32 RESISTANT Resistant     PIP/TAZO 64 INTERMEDIATE Intermediate     * ABUNDANT ESCHERICHIA COLI  Culture, Urine     Status: None   Collection Time: 06/12/19  3:50 PM   Specimen: Urine, Catheterized  Result Value Ref Range Status   Specimen Description URINE, CATHETERIZED  Final   Special Requests Normal  Final   Culture   Final    NO GROWTH Performed at [facility] [place] [facility], [address] [place], [place] [address]    Report Status 06/13/2019 FINAL  Final  Culture, blood (Routine X 2) w Reflex to ID Panel     Status: None (Preliminary result)   Collection Time: 06/12/19  4:22 PM   Specimen: BLOOD RIGHT HAND  Result Value Ref Range Status   Specimen Description BLOOD RIGHT HAND  Final   Special Requests   Final    BOTTLES DRAWN AEROBIC AND ANAEROBIC Blood Culture adequate volume   Culture   Final    NO GROWTH 4 DAYS Performed at [facility], [address] [place], [place] [address]    Report Status PENDING  Incomplete  Culture, respiratory (non-expectorated)     Status: None   Collection Time: 06/12/19  8:34 PM   Specimen: Tracheal Aspirate; Respiratory  Result Value Ref Range Status   Specimen Description TRACHEAL ASPIRATE  Final   Special Requests NONE  Final   Gram Stain   Final    RARE WBC PRESENT,BOTH PMN AND MONONUCLEAR MODERATE GRAM NEGATIVE RODS RARE GRAM POSITIVE COCCI IN PAIRS Performed at [facility], [address] [place], [place] [address]    Culture   Final    MODERATE ENTEROBACTER AEROGENES MODERATE ESCHERICHIA COLI    Report Status  06/15/2019 FINAL  Final   Organism ID,  Bacteria ENTEROBACTER AEROGENES  Final   Organism ID, Bacteria ESCHERICHIA COLI  Final      Susceptibility   Enterobacter aerogenes - MIC*    CEFAZOLIN >=64 RESISTANT Resistant     CEFEPIME <=1 SENSITIVE Sensitive     CEFTAZIDIME 16 INTERMEDIATE Intermediate     CEFTRIAXONE 16 INTERMEDIATE Intermediate     CIPROFLOXACIN <=0.25 SENSITIVE Sensitive     GENTAMICIN <=1 SENSITIVE Sensitive     IMIPENEM 1 SENSITIVE Sensitive     TRIMETH/SULFA <=20 SENSITIVE Sensitive     PIP/TAZO >=128 RESISTANT Resistant     * MODERATE ENTEROBACTER AEROGENES   Escherichia coli - MIC*    AMPICILLIN >=32 RESISTANT Resistant     CEFAZOLIN <=4 SENSITIVE Sensitive     CEFEPIME <=1 SENSITIVE Sensitive     CEFTAZIDIME <=1 SENSITIVE Sensitive     CEFTRIAXONE <=1 SENSITIVE Sensitive     CIPROFLOXACIN 0.5 SENSITIVE Sensitive     GENTAMICIN <=1 SENSITIVE Sensitive     IMIPENEM <=0.25 SENSITIVE Sensitive     TRIMETH/SULFA <=20 SENSITIVE Sensitive     AMPICILLIN/SULBACTAM 16 INTERMEDIATE Intermediate     PIP/TAZO <=4 SENSITIVE Sensitive     * MODERATE ESCHERICHIA COLI  C Difficile Quick Screen w PCR reflex     Status: None   Collection Time: 06/12/19 10:41 PM   Specimen: STOOL  Result Value Ref Range Status   C Diff antigen NEGATIVE NEGATIVE Final   C Diff toxin NEGATIVE NEGATIVE Final   C Diff interpretation No C. difficile detected.  Final    Comment: Performed at [facility], [address] [place], [place] [address]    Anti-infectives:  Anti-infectives (From admission, onward)   Start     Dose/Rate Route Frequency Ordered Stop   06/13/19 1900  vancomycin (VANCOCIN) 1,000 mg in sodium chloride 0.9 % 100 mL IVPB  Status:  Discontinued     1,000 mg 100 mL/hr over 60 Minutes Intravenous Every 24 hours 06/13/19 0947 06/14/19 0915   06/13/19 1830  vancomycin (VANCOCIN) IVPB 1000 mg/200 mL premix  Status:  Discontinued     1,000 mg 200 mL/hr over 60 Minutes  Intravenous Every 24 hours 06/12/19 1704 06/13/19 0947   06/13/19 0900  metroNIDAZOLE (FLAGYL) IVPB 500 mg  Status:  Discontinued     500 mg 100 mL/hr over 60 Minutes Intravenous Every 8 hours 06/13/19 0853 06/14/19 0915   06/12/19 1800  vancomycin (VANCOREADY) IVPB 2000 mg/400 mL     2,000 mg 200 mL/hr over 120 Minutes Intravenous  Once 06/12/19 1704 06/12/19 1937   06/12/19 1200  piperacillin-tazobactam (ZOSYN) IVPB 3.375 g  Status:  Discontinued     3.375 g 100 mL/hr over 30 Minutes Intravenous Every 6 hours 06/12/19 0846 06/12/19 0846   06/12/19 1000  ceFEPIme (MAXIPIME) 2 g in sodium chloride 0.9 % 100 mL IVPB     2 g 200 mL/hr over 30 Minutes Intravenous Every 12 hours 06/12/19 0912 06/22/19 0959   06/12/19 0900  piperacillin-tazobactam (ZOSYN) IVPB 3.375 g  Status:  Discontinued     3.375 g 100 mL/hr over 30 Minutes Intravenous Every 6 hours 06/12/19 0846 06/12/19 0912   06/10/19 1400  nafcillin 12 g in sodium chloride 0.9 % 500 mL continuous infusion  Status:  Discontinued     12 g 20.8 mL/hr over 24 Hours Intravenous Every 24 hours 06/09/19 1533 06/09/19 1608   06/10/19 0200  nafcillin 12 g in sodium chloride 0.9 % 500 mL continuous infusion  Status:  Discontinued     12 g 20.8 mL/hr over 24 Hours Intravenous Every 24 hours 06/09/19 1608 06/12/19 0805   06/09/19 2124  ceFAZolin (ANCEF) IVPB 1 g/50 mL premix  Status:  Discontinued     1 g 100 mL/hr over 30 Minutes Intravenous Every 24 hours 06/09/19 0746 06/09/19 1301   06/09/19 1400  ceFAZolin (ANCEF) IVPB 2g/100 mL premix  Status:  Discontinued     2 g 200 mL/hr over 30 Minutes Intravenous Every 12 hours 06/09/19 1301 06/09/19 1533   06/05/19 2030  ceFAZolin (ANCEF) IVPB 2g/100 mL premix  Status:  Discontinued     2 g 200 mL/hr over 30 Minutes Intravenous Every 12 hours 06/05/19 0905 06/09/19 0746   06/03/19 2300  ceFEPIme (MAXIPIME) 2 g in sodium chloride 0.9 % 100 mL IVPB  Status:  Discontinued     2 g 200 mL/hr over 30  Minutes Intravenous Every 24 hours 06/03/19 0641 06/03/19 1831   06/03/19 2000  ceFEPIme (MAXIPIME) 2 g in sodium chloride 0.9 % 100 mL IVPB  Status:  Discontinued     2 g 200 mL/hr over 30 Minutes Intravenous Every 12 hours 06/03/19 1830 06/05/19 0905   06/02/19 1000  clindamycin (CLEOCIN) IVPB 600 mg  Status:  Discontinued     600 mg 100 mL/hr over 30 Minutes Intravenous Every 8 hours 06/02/19 0142 06/02/19 0812   06/02/19 0830  ceFEPIme (MAXIPIME) 2 g in sodium chloride 0.9 % 100 mL IVPB  Status:  Discontinued     2 g 200 mL/hr over 30 Minutes Intravenous Every 12 hours 06/02/19 0812 06/03/19 0641   06/02/19 0815  metroNIDAZOLE (FLAGYL) IVPB 500 mg  Status:  Discontinued     500 mg 100 mL/hr over 60 Minutes Intravenous Every 8 hours 06/02/19 0812 06/05/19 0905   06/02/19 0200  clindamycin (CLEOCIN) IVPB 600 mg     600 mg 100 mL/hr over 30 Minutes Intravenous  Once 06/02/19 0142 06/02/19 0254   05/31/19 1330  nafcillin 12 g in sodium chloride 0.9 % 500 mL continuous infusion  Status:  Discontinued     12 g 20.8 mL/hr over 24 Hours Intravenous Every 24 hours 05/31/19 1052 06/02/19 0812   05/31/19 0200  nafcillin 2 g in sodium chloride 0.9 % 100 mL IVPB  Status:  Discontinued     2 g 200 mL/hr over 30 Minutes Intravenous Every 4 hours 05/31/19 0055 05/31/19 1052   05/31/19 0115  nafcillin injection 2 g  Status:  Discontinued     2 g Intravenous Every 4 hours 05/31/19 0027 05/31/19 0054      Best Practice/Protocols:  VTE Prophylaxis: Heparin (SQ) Continous Sedation  Consults: Treatment Team:  Yolonda Kida, MD Estanislado Emms, MD    Studies:    Events:  Subjective:    Overnight Issues:   Objective:  Vital signs for last 24 hours: Temp:  [97.2 F (36.2 C)-98.8 F (37.1 C)] 97.9 F (36.6 C) (05/14 0700) Pulse Rate:  [54-79] 58 (05/14 0700) Resp:  [20-36] 35 (05/14 0700) BP: (83-142)/(42-81) 84/48 (05/14 0700) SpO2:  [35 %-100 %] 100 % (05/14 0700) Arterial  Line BP: (104-172)/(39-71) 111/51 (05/14 0700) FiO2 (%):  [40 %] 40 % (05/14 0400) Weight:  [103.1 kg] 103.1 kg (05/14 0500)  Hemodynamic parameters for last 24 hours:    Intake/Output from previous day: 05/13 0701 - 05/14 0700 In: 2443.9 [I.V.:2232; IV Piggyback:199.9] Out: 4724 [Emesis/NG output:450]  Intake/Output this shift: No intake/output data  recorded.  Vent settings for last 24 hours: Vent Mode: PRVC FiO2 (%):  [40 %] 40 % Set Rate:  [35 bmp] 35 bmp Vt Set:  [500 mL] 500 mL PEEP:  [5 cmH20] 5 cmH20 Plateau Pressure:  [15 cmH20-20 cmH20] 18 cmH20  Physical Exam:  General: no respiratory distress Neuro: some spont mvt RUE, not F/C HEENT/Neck: ETT Resp: clear to auscultation bilaterally CVS: RRR GI: soft, NT Extremities: edema 1+  Results for orders placed or performed during the hospital encounter of 05/19/2019 (from the past 24 hour(s))  Glucose, capillary     Status: Abnormal   Collection Time: 06/16/19  8:05 AM  Result Value Ref Range   Glucose-Capillary 133 (H) 70 - 99 mg/dL  Glucose, capillary     Status: Abnormal   Collection Time: 06/16/19 11:24 AM  Result Value Ref Range   Glucose-Capillary 115 (H) 70 - 99 mg/dL  Glucose, capillary     Status: Abnormal   Collection Time: 06/16/19  3:32 PM  Result Value Ref Range   Glucose-Capillary 134 (H) 70 - 99 mg/dL  Renal function panel (daily at 1600)     Status: Abnormal   Collection Time: 06/16/19  4:26 PM  Result Value Ref Range   Sodium 137 135 - 145 mmol/L   Potassium 4.4 3.5 - 5.1 mmol/L   Chloride 101 98 - 111 mmol/L   CO2 23 22 - 32 mmol/L   Glucose, Bld 152 (H) 70 - 99 mg/dL   BUN 46 (H) 8 - 23 mg/dL   Creatinine, Ser 0.93 (H) 0.61 - 1.24 mg/dL   Calcium 8.0 (L) 8.9 - 10.3 mg/dL   Phosphorus 3.2 2.5 - 4.6 mg/dL   Albumin 1.6 (L) 3.5 - 5.0 g/dL   GFR calc non Af Amer 32 (L) >60 mL/min   GFR calc Af Amer 37 (L) >60 mL/min   Anion gap 13 5 - 15  Glucose, capillary     Status: Abnormal   Collection  Time: 06/16/19  7:28 PM  Result Value Ref Range   Glucose-Capillary 130 (H) 70 - 99 mg/dL  Glucose, capillary     Status: Abnormal   Collection Time: 06/16/19 11:25 PM  Result Value Ref Range   Glucose-Capillary 108 (H) 70 - 99 mg/dL  Glucose, capillary     Status: Abnormal   Collection Time: 06/17/19  3:19 AM  Result Value Ref Range   Glucose-Capillary 128 (H) 70 - 99 mg/dL  Renal function panel (daily at 0500)     Status: Abnormal   Collection Time: 06/17/19  4:14 AM  Result Value Ref Range   Sodium 135 135 - 145 mmol/L   Potassium 4.0 3.5 - 5.1 mmol/L   Chloride 101 98 - 111 mmol/L   CO2 23 22 - 32 mmol/L   Glucose, Bld 163 (H) 70 - 99 mg/dL   BUN 39 (H) 8 - 23 mg/dL   Creatinine, Ser 8.18 (H) 0.61 - 1.24 mg/dL   Calcium 8.2 (L) 8.9 - 10.3 mg/dL   Phosphorus 3.4 2.5 - 4.6 mg/dL   Albumin 1.6 (L) 3.5 - 5.0 g/dL   GFR calc non Af Amer 36 (L) >60 mL/min   GFR calc Af Amer 41 (L) >60 mL/min   Anion gap 11 5 - 15  Magnesium     Status: Abnormal   Collection Time: 06/17/19  4:14 AM  Result Value Ref Range   Magnesium 2.6 (H) 1.7 - 2.4 mg/dL    Assessment & Plan: Present  on Admission: . Closed displaced fracture of body of left scapula . Traumatic brain injury with loss of consciousness (HCC) . OSA (obstructive sleep apnea) . RLS (restless legs syndrome) . Essential hypertension . Acute blood loss anemia . Multiple trauma . Tachycardia . Multiple closed fractures of ribs of left side . Abdominal distention . Closed left scapular fracture    LOS: 24 days   Additional comments:I reviewed the patient's new clinical lab test results. . [facility]   Acute hypoxic respiratory failure with ARDS - previously resolved, new insult 5/9 with increased vent req'ts, now back on 40% and 5 SAH, concussion - NSGY c/s (Dr. Maurice Small), stable CT head 4/21, Keppra x7d for sz ppx, MR 5/6 with shear injury, expected.  Comminuted left scapula fx- sling for now, ortho c/s (Dr. 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 As above. Malnutrition - restart TPN today Hyperbilirubinemia - likely reabsorption of scattered hematomas. RUQ u/s as above.  AKI - CRRT per Renal, UF as much as possible, no more than 10 of levophed FEN - hold TF, restarted TPN 5/12, transaminitis improved Hyperglycemia - lantus restarted 5/12 VTE - SCDs, SQH (AKI) Dispo - ICU CRRT - I D/W Dr. Kathrene Bongo at the bedside. Terminal extubation tomorrow. Critical Care Total Time*: 32 Minutes  Violeta Gelinas, MD, MPH, FACS Trauma & General Surgery Use [email or web address] to contact on call provider  06/17/2019  *Care during the described time interval was provided by me. I have reviewed this patient's available data, including medical history, events of note, physical examination and test results as part of my evaluation.
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
## 2019-06-18 LAB — GLUCOSE, CAPILLARY
Glucose-Capillary: 125 mg/dL — ABNORMAL HIGH (ref 70–99)
Glucose-Capillary: 133 mg/dL — ABNORMAL HIGH (ref 70–99)

## 2019-06-18 MED ORDER — GLYCOPYRROLATE 0.2 MG/ML IJ SOLN: 0.2000 mg | INTRAMUSCULAR | Status: DC | PRN

## 2019-06-18 MED ORDER — GLYCOPYRROLATE 1 MG PO TABS
1.0000 mg | ORAL_TABLET | ORAL | Status: DC | PRN
  Filled 2019-06-18: qty 1

## 2019-06-18 MED ORDER — GLYCOPYRROLATE 0.2 MG/ML IJ SOLN
0.2000 mg | INTRAMUSCULAR | Status: DC | PRN
  Administered 2019-06-18: 0.2 mg via INTRAVENOUS
  Filled 2019-06-18: qty 1

## 2019-06-18 MED ORDER — HALOPERIDOL LACTATE 2 MG/ML PO CONC
0.5000 mg | ORAL | Status: DC | PRN
  Filled 2019-06-18: qty 0.3

## 2019-06-18 MED ORDER — GLYCOPYRROLATE 1 MG PO TABS: 1.0000 mg | ORAL_TABLET | ORAL | Status: DC | PRN

## 2019-06-18 MED ORDER — ONDANSETRON HCL 4 MG/2ML IJ SOLN: 4.0000 mg | Freq: Four times a day (QID) | INTRAMUSCULAR | Status: DC | PRN

## 2019-06-18 MED ORDER — BIOTENE DRY MOUTH MT LIQD: 15.0000 mL | OROMUCOSAL | Status: DC | PRN

## 2019-06-18 MED ORDER — HALOPERIDOL LACTATE 5 MG/ML IJ SOLN: 0.5000 mg | INTRAMUSCULAR | Status: DC | PRN

## 2019-06-18 MED ORDER — GLYCOPYRROLATE 0.2 MG/ML IJ SOLN
0.4000 mg | INTRAMUSCULAR | Status: DC
  Administered 2019-06-18: 0.4 mg via INTRAVENOUS
  Filled 2019-06-18: qty 2

## 2019-06-18 MED ORDER — ACETAMINOPHEN 650 MG RE SUPP: 650.0000 mg | Freq: Four times a day (QID) | RECTAL | Status: DC | PRN

## 2019-06-18 MED ORDER — HALOPERIDOL 0.5 MG PO TABS
0.5000 mg | ORAL_TABLET | ORAL | Status: DC | PRN
  Filled 2019-06-18: qty 1

## 2019-06-18 MED ORDER — ONDANSETRON 4 MG PO TBDP: 4.0000 mg | ORAL_TABLET | Freq: Four times a day (QID) | ORAL | Status: DC | PRN

## 2019-06-18 MED ORDER — POLYVINYL ALCOHOL 1.4 % OP SOLN
1.0000 [drp] | Freq: Four times a day (QID) | OPHTHALMIC | Status: DC | PRN
  Filled 2019-06-18: qty 15

## 2019-06-18 MED ORDER — ACETAMINOPHEN 325 MG PO TABS: 650.0000 mg | ORAL_TABLET | Freq: Four times a day (QID) | ORAL | Status: DC | PRN

## 2019-06-18 MED ORDER — SCOPOLAMINE 1 MG/3DAYS TD PT72
1.0000 | MEDICATED_PATCH | TRANSDERMAL | Status: DC
  Administered 2019-06-18: 1.5 mg via TRANSDERMAL
  Filled 2019-06-18: qty 1

## 2019-06-20 ENCOUNTER — Encounter (HOSPITAL_COMMUNITY): Payer: Self-pay | Admitting: General Practice

## 2019-07-05 NOTE — Progress Notes (Signed)
Asystole noted on monitor, death pronounced by two RNs Everette Rank, RN and Milta Deiters, RN) when no heart or breath sounds auscultated. Family at bedside notified.   Personal belonging of one green bracelet to be kept with patient, per family request. Wife noted he had one yellow metal earring (open ball hoop earring with two balls on either end), this item was not found in room. Wife said it was removed prior to MRI, but when this nurse contacted the nurse who took him to MRI, that nurse Skeet Simmer O.) remembered that there was no earring present before the scan. Will continue to search for earring and notify wife, Inetta Fermo.  Patient's body prepped and cleaned. Wasted 60 ml Fentanyl gtt, witnessed by Leafy Ro RN.
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
## 2019-07-05 NOTE — Procedures (Signed)
Extubation Procedure Note  Patient Details:   Name: Ian Velasquez DOB: 1955/12/23 MRN: [id number]   Airway Documentation:   Patient compassionately extubated per orders for comfort care and withdrawal of life support.  Vent end date: 06/11/2019 Vent end time: 1106   Evaluation  O2 sats: stable throughout Complications: No apparent complications Patient did tolerate procedure well. Bilateral Breath Sounds: Diminished, Rhonchi   No  Ian Velasquez 06/07/2019, 11:06 AM

## 2019-07-05 NOTE — Progress Notes (Signed)
Family ready to move forward with compassionate extubation. Discontinuing CRRT. End of life order set entered. Will perform extubation. Medications available for pain/discomfort.   Diamantina Monks, MD General and Trauma Surgery [facility] Surgery
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
## 2019-07-05 DEATH — deceased
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
